# Patient Record
Sex: Female | Born: 1970 | Race: White | Hispanic: No | Marital: Married | Smoking: Never smoker
Health system: Southern US, Community
[De-identification: ages and names within clinical notes are randomized; demographics above are authoritative.]

## PROBLEM LIST (undated history)

## (undated) DIAGNOSIS — IMO0002 Reserved for concepts with insufficient information to code with codable children: Secondary | ICD-10-CM

## (undated) DIAGNOSIS — E039 Hypothyroidism, unspecified: Secondary | ICD-10-CM

## (undated) DIAGNOSIS — N920 Excessive and frequent menstruation with regular cycle: Secondary | ICD-10-CM

## (undated) DIAGNOSIS — I10 Essential (primary) hypertension: Secondary | ICD-10-CM

## (undated) DIAGNOSIS — C50911 Malignant neoplasm of unspecified site of right female breast: Secondary | ICD-10-CM

## (undated) DIAGNOSIS — IMO0001 Reserved for inherently not codable concepts without codable children: Secondary | ICD-10-CM

## (undated) DIAGNOSIS — C50919 Malignant neoplasm of unspecified site of unspecified female breast: Secondary | ICD-10-CM

## (undated) DIAGNOSIS — R112 Nausea with vomiting, unspecified: Secondary | ICD-10-CM

## (undated) DIAGNOSIS — Z803 Family history of malignant neoplasm of breast: Secondary | ICD-10-CM

## (undated) DIAGNOSIS — E282 Polycystic ovarian syndrome: Secondary | ICD-10-CM

## (undated) DIAGNOSIS — Z9889 Other specified postprocedural states: Secondary | ICD-10-CM

## (undated) HISTORY — DX: Reserved for inherently not codable concepts without codable children: IMO0001

## (undated) HISTORY — DX: Malignant neoplasm of unspecified site of unspecified female breast: C50.919

## (undated) HISTORY — DX: Reserved for concepts with insufficient information to code with codable children: IMO0002

## (undated) HISTORY — PX: TONSILLECTOMY: SUR1361

## (undated) HISTORY — DX: Family history of malignant neoplasm of breast: Z80.3

## (undated) NOTE — *Deleted (*Deleted)
Patient Care Team: Merri Brunette, MD as PCP - General (Family Medicine) Serena Croissant, MD as Consulting Physician (Hematology and Oncology) Lurline Hare, MD as Consulting Physician (Radiation Oncology) Emelia Loron, MD as Consulting Physician (General Surgery) Salomon Fick, NP as Nurse Practitioner (Hematology and Oncology)  DIAGNOSIS: No diagnosis found.  SUMMARY OF ONCOLOGIC HISTORY: Oncology History  Breast cancer of upper-outer quadrant of right female breast (HCC)  03/16/2015 Mammogram   Right breast: mass   03/16/2015 Breast US   Right breast: hypoechoic irregular shadowing mass at 12 o'clock, 4 cm from the nipple measuring 1.4 x 1.3 x 1.2 cm. Right axillary lymph nodes with smooth cortical thickness   03/22/2015 Initial Biopsy   Right breast biopsy: Invasive ductal carcinoma with calcifications; ER+ (98%), PR+ (98%), Ki-67 6%, HER-2 negative (ratio 1.35)   04/04/2015 Breast MRI   Right breast: 2.0 cm hematoma/ seroma within the upper central portion of the breast following recent stereotactic guided core biopsy   04/05/2015 Clinical Stage   Stage IA: T1c N0   04/06/2015 Procedure   Genetics: OvaNext panel revealed  VUTS at MSH2 (c.2164G>A) otherwise negative at ATM, BARD1, BRCA1, BRCA2, BRIP1, CDH1, CHEK2, EPCAM, MLH1, MRE11A, MSH6, MUTYH, NBN, NF1, PALB2, PMS2, PTEN, RAD50, RAD51C, RAD51D, SMARCA4, STK11, and TP53.   UPDATE:  MSH2 c.2164G>A VUS was amended to Variant, Likely Benign on 04/28/2019 due to internal data.   04/28/2015 Definitive Surgery   Right lumpectomy/SLNB Dwain Sarna): Invasive ductal carcinoma grade 1, 0.9 cm, low-grade DCIS, margins negative, 0/6 sentinel nodes negative, ER 98%, PR 98%, HER-2 negative (ratio 1.29), Ki-67 6%,  Oncotype DX score 10 (7% ROR)   04/28/2015 Pathologic Stage   Stage IA (T1b N0)    04/28/2015 Oncotype testing   Score 10 (7% ROR)   06/15/2015 - 07/14/2015 Radiation Therapy   Adjuvant RT Michell Heinrich): Right breast  42.72 Gy over 21 fractions; right breast boost 10 G over 5 fractions   07/14/2015 -  Anti-estrogen oral therapy   Tamoxifen 20 mg daily. Planned duration of therapy 5-10 years.   09/01/2015 Survivorship   Survivorship care plan completed and given to patient.     CHIEF COMPLIANT:  Follow-upof right breast cancer  INTERVAL HISTORY: [name] is a 56 y.o. with above-mentioned history of right breast cancer treated with lumpectomy,radiation, and who was on tamoxifen but discontinued it due to intermittent fevers and easy bruising. Labs showed she was positive for type 2 von Willebrand factor and factor VIII deficiency. She experienced left breast swelling and tenderness and breast MRI on 04/19/20 showed no evidence of malignancy bilaterally. She presents to the clinic todayfor follow-up.  ALLERGIES:  is allergic to ciprofloxacin and sulfa antibiotics.  MEDICATIONS:  Current Outpatient Medications  Medication Sig Dispense Refill  . ALPRAZolam (XANAX) 0.5 MG tablet TK 1 T PO Q 6 H PRA  1  . diazepam (VALIUM) 5 MG tablet Take 1 tablet (5 mg total) by mouth as directed. Take one tablet one hour prior to MRI, then one tablet upon arrival if needed; must have driver 2 tablet 0  . empagliflozin (JARDIANCE) 10 MG TABS tablet Take 10 mg by mouth daily.    Marland Kitchen levothyroxine (SYNTHROID, LEVOTHROID) 50 MCG tablet Take 50 mcg by mouth daily before breakfast.    . lisinopril (ZESTRIL) 10 MG tablet Take 10 mg by mouth daily.    . pantoprazole (PROTONIX) 40 MG tablet Take 40 mg by mouth daily.    Marland Kitchen venlafaxine XR (EFFEXOR-XR) 75 MG 24  hr capsule TAKE 1 CAPSULE(75 MG) BY MOUTH DAILY WITH BREAKFAST 90 capsule 3   No current facility-administered medications for this visit.    PHYSICAL EXAMINATION: ECOG PERFORMANCE STATUS: {CHL ONC ECOG PS:[phone]}  There were no vitals filed for this visit. There were no vitals filed for this visit.  BREAST:*** No palpable masses or nodules in either right or  left breasts. No palpable axillary supraclavicular or infraclavicular adenopathy no breast tenderness or nipple discharge. (exam performed in the presence of a chaperone)  LABORATORY DATA:  I have reviewed the data as listed CMP Latest Ref Rng & Units 12/30/2019 06/19/2016 07/14/2015  Glucose 70 - 99 mg/dL 161(W) 960(A) 540  BUN 6 - 20 mg/dL 12 11 98.1  Creatinine 0.44 - 1.00 mg/dL 1.91 4.78 0.8  Sodium 295 - 145 mmol/L 140 135 140  Potassium 3.5 - 5.1 mmol/L 4.5 3.3(L) 3.1(L)  Chloride 98 - 111 mmol/L 104 103 -  CO2 22 - 32 mmol/L 26 22 24   Calcium 8.9 - 10.3 mg/dL 9.4 62.1 9.8  Total Protein 6.5 - 8.1 g/dL 7.8 - 7.6  Total Bilirubin 0.3 - 1.2 mg/dL 0.4 - 3.08  Alkaline Phos 38 - 126 U/L 76 - 60  AST 15 - 41 U/L 19 - 14  ALT 0 - 44 U/L 31 - 20    Lab Results  Component Value Date   WBC 7.1 12/30/2019   HGB 15.1 (H) 12/30/2019   HCT 44.4 12/30/2019   MCV 93.1 12/30/2019   PLT 211 12/30/2019   NEUTROABS 4.7 12/30/2019    ASSESSMENT & PLAN:  No problem-specific Assessment & Plan notes found for this encounter.    No orders of the defined types were placed in this encounter.  The patient has a good understanding of the overall plan. she agrees with it. she will call with any problems that may develop before the next visit here.  Total time spent: *** mins including face to face time and time spent for planning, charting and coordination of care  Serena Croissant, MD 09/05/2020  I, Kirt Boys Dorshimer, am acting as scribe for Dr. Serena Croissant.  {insert scribe attestation}

---

## 1998-02-01 ENCOUNTER — Inpatient Hospital Stay (HOSPITAL_COMMUNITY): Admission: AD | Admit: 1998-02-01 | Discharge: 1998-02-04 | Payer: Self-pay | Admitting: Obstetrics and Gynecology

## 1998-03-14 ENCOUNTER — Other Ambulatory Visit: Admission: RE | Admit: 1998-03-14 | Discharge: 1998-03-14 | Payer: Self-pay | Admitting: *Deleted

## 1999-04-05 ENCOUNTER — Other Ambulatory Visit: Admission: RE | Admit: 1999-04-05 | Discharge: 1999-04-05 | Payer: Self-pay | Admitting: Obstetrics and Gynecology

## 1999-08-25 ENCOUNTER — Encounter: Payer: Self-pay | Admitting: Family Medicine

## 1999-08-25 ENCOUNTER — Encounter: Admission: RE | Admit: 1999-08-25 | Discharge: 1999-08-25 | Payer: Self-pay | Admitting: Family Medicine

## 2000-03-25 ENCOUNTER — Inpatient Hospital Stay (HOSPITAL_COMMUNITY): Admission: AD | Admit: 2000-03-25 | Discharge: 2000-03-25 | Payer: Self-pay | Admitting: Obstetrics and Gynecology

## 2000-03-25 ENCOUNTER — Encounter: Payer: Self-pay | Admitting: Obstetrics and Gynecology

## 2000-03-28 ENCOUNTER — Other Ambulatory Visit: Admission: RE | Admit: 2000-03-28 | Discharge: 2000-03-28 | Payer: Self-pay | Admitting: Obstetrics and Gynecology

## 2000-07-02 ENCOUNTER — Inpatient Hospital Stay (HOSPITAL_COMMUNITY): Admission: AD | Admit: 2000-07-02 | Discharge: 2000-07-02 | Payer: Self-pay | Admitting: Obstetrics and Gynecology

## 2000-07-05 ENCOUNTER — Encounter: Payer: Self-pay | Admitting: Obstetrics & Gynecology

## 2000-07-05 ENCOUNTER — Inpatient Hospital Stay (HOSPITAL_COMMUNITY): Admission: AD | Admit: 2000-07-05 | Discharge: 2000-07-05 | Payer: Self-pay | Admitting: Obstetrics & Gynecology

## 2000-10-09 ENCOUNTER — Inpatient Hospital Stay (HOSPITAL_COMMUNITY): Admission: AD | Admit: 2000-10-09 | Discharge: 2000-10-12 | Payer: Self-pay | Admitting: Obstetrics and Gynecology

## 2000-10-15 ENCOUNTER — Inpatient Hospital Stay (HOSPITAL_COMMUNITY): Admission: AD | Admit: 2000-10-15 | Discharge: 2000-10-15 | Payer: Self-pay | Admitting: Obstetrics and Gynecology

## 2000-10-17 ENCOUNTER — Encounter: Admission: RE | Admit: 2000-10-17 | Discharge: 2001-01-15 | Payer: Self-pay | Admitting: Obstetrics and Gynecology

## 2000-10-29 HISTORY — PX: ACHILLES TENDON REPAIR: SUR1153

## 2000-11-21 ENCOUNTER — Other Ambulatory Visit: Admission: RE | Admit: 2000-11-21 | Discharge: 2000-11-21 | Payer: Self-pay | Admitting: Obstetrics and Gynecology

## 2001-12-09 ENCOUNTER — Other Ambulatory Visit: Admission: RE | Admit: 2001-12-09 | Discharge: 2001-12-09 | Payer: Self-pay | Admitting: Obstetrics and Gynecology

## 2002-12-01 ENCOUNTER — Other Ambulatory Visit: Admission: RE | Admit: 2002-12-01 | Discharge: 2002-12-01 | Payer: Self-pay | Admitting: Obstetrics and Gynecology

## 2004-03-07 ENCOUNTER — Other Ambulatory Visit: Admission: RE | Admit: 2004-03-07 | Discharge: 2004-03-07 | Payer: Self-pay | Admitting: Obstetrics and Gynecology

## 2005-12-13 ENCOUNTER — Other Ambulatory Visit: Admission: RE | Admit: 2005-12-13 | Discharge: 2005-12-13 | Payer: Self-pay | Admitting: Obstetrics and Gynecology

## 2006-11-11 ENCOUNTER — Emergency Department (HOSPITAL_COMMUNITY): Admission: EM | Admit: 2006-11-11 | Discharge: 2006-11-11 | Payer: Self-pay | Admitting: Family Medicine

## 2007-08-08 ENCOUNTER — Encounter: Admission: RE | Admit: 2007-08-08 | Discharge: 2007-08-08 | Payer: Self-pay | Admitting: Obstetrics and Gynecology

## 2009-02-21 ENCOUNTER — Ambulatory Visit (HOSPITAL_COMMUNITY): Admission: EM | Admit: 2009-02-21 | Discharge: 2009-02-22 | Payer: Self-pay | Admitting: Emergency Medicine

## 2009-02-21 ENCOUNTER — Encounter (INDEPENDENT_AMBULATORY_CARE_PROVIDER_SITE_OTHER): Payer: Self-pay | Admitting: General Surgery

## 2009-02-21 HISTORY — PX: APPENDECTOMY: SHX54

## 2011-02-07 LAB — URINALYSIS, ROUTINE W REFLEX MICROSCOPIC
Glucose, UA: NEGATIVE mg/dL
Leukocytes, UA: NEGATIVE
Protein, ur: NEGATIVE mg/dL
pH: 7 (ref 5.0–8.0)

## 2011-02-07 LAB — DIFFERENTIAL
Basophils Relative: 0 % (ref 0–1)
Monocytes Relative: 6 % (ref 3–12)
Neutro Abs: 5.1 10*3/uL (ref 1.7–7.7)
Neutrophils Relative %: 67 % (ref 43–77)

## 2011-02-07 LAB — POCT I-STAT, CHEM 8
Chloride: 106 mEq/L (ref 96–112)
Glucose, Bld: 106 mg/dL — ABNORMAL HIGH (ref 70–99)
HCT: 41 % (ref 36.0–46.0)
Potassium: 3.8 mEq/L (ref 3.5–5.1)
Sodium: 138 mEq/L (ref 135–145)

## 2011-02-07 LAB — CBC
MCHC: 34.8 g/dL (ref 30.0–36.0)
RBC: 4.52 MIL/uL (ref 3.87–5.11)
WBC: 7.6 10*3/uL (ref 4.0–10.5)

## 2011-02-07 LAB — URINE MICROSCOPIC-ADD ON

## 2011-02-07 LAB — PREGNANCY, URINE: Preg Test, Ur: NEGATIVE

## 2011-03-13 NOTE — H&P (Signed)
NAME:  April, [name] NO.:  [id number]   MEDICAL RECORD NO.:  [id number]          PATIENT TYPE:  INP   LOCATION:  0098                         FACILITY:  [facility]   PHYSICIAN:  Lorne Skeens. Hoxworth, M.D.DATE OF BIRTH:  February 02, 1971   DATE OF ADMISSION:  02/21/2009  DATE OF DISCHARGE:                              HISTORY & PHYSICAL   CHIEF COMPLAINT:  Abdominal pain   HISTORY OF PRESENT ILLNESS:  [name] is a generally healthy 40-year-  old female who about 24 hours ago noted the gradual onset of  midabdominal periumbilical pain.  She thought this felt like gas or  pressure.  This persisted and when she woke today, the pain was  localized in the right lower quadrant, constant and somewhat more  severe, appears worse with any motion.  She has had nausea but no  vomiting.  She has a history of some ovarian cysts that she presented to  her GYN physician and pelvic exam was unremarkable.  She was sent to the  emergency room for further evaluation including CT scan.  She has not  had any fever or chills.  She has no chronic GI symptoms.  No urinary  symptoms or any history of previous similar complaints.   PAST MEDICAL HISTORY:  Previous surgery includes C-section x2 and  Achilles tendon repair.  No medical illnesses.   MEDICATIONS:  Birth control pills and p.r.n. Motrin.   ALLERGIES:  None.   SOCIAL HISTORY:  She is married.  Does not smoke cigarettes.  Drinks  occasional alcohol.   FAMILY HISTORY:  Mother with history of diabetes and hypertension.  She  had an aunt with breast cancer.   REVIEW OF SYSTEMS:  GENERAL:  Positive for malaise.  No fever or chills.  RESPIRATORY:  No shortness of breath, cough, or wheezing.  CARDIAC:  No chest pain, palpitations, or significant heart disease.  ABDOMEN:  GI as above.  GU:  As above.   PHYSICAL EXAMINATION:  VITAL SIGNS:  Temperature is 98.6, pulse 83,  respirations 18, and blood pressure 142/98.  GENERAL:  A  healthy-appearing Caucasian female in no acute distress.  SKIN:  Warm and dry.  No rash or infection.  HEENT:  No palpable masses or thyromegaly.  Oropharynx is clear.  Sclerae nonicteric.  LYMPH NODES:  No cervical, supraclavicular, or inguinal nodes palpable.  CARDIAC:  Regular rate and rhythm.  No murmurs.  No edema.  LUNGS:  Clear without wheezing or increased work of breathing.  ABDOMEN:  Positive bowel sounds.  Nondistended.  There is localized  right lower quadrant tenderness with some guarding.  No discernible  masses or organomegaly.  No peritoneal signs.  EXTREMITIES:  No joint swelling or deformity.  NEUROLOGIC:  Alert and oriented.  Motor and sensory exams grossly  normal.   LABORATORY AND X-RAY:  White count normal at 7.6 and hemoglobin 13.9.  Urinalysis negative.  Electrolytes normal.  Pregnancy test negative.   CT scan of the abdomen and pelvis is reviewed.  This shows thickening of  the appendix up to 1 centimeter in diameter  and nonfilling with  contrast.  No significant periappendiceal inflammation.  This is felt to  be consistent with early appendicitis.   ASSESSMENT/PLAN:  Clinical course, physical exam, and CT scan all  suggesting acute appendicitis.  I have recommended proceeding with  laparoscopic appendectomy and the patient admitted for this procedure.      Lorne Skeens. Hoxworth, M.D.  Electronically Signed     BTH/MEDQ  D:  02/21/2009  T:  02/22/2009  Job:  [id number]

## 2011-03-13 NOTE — Op Note (Signed)
NAME:  YI, April NO.:  [id number]   MEDICAL RECORD NO.:  [id number]          PATIENT TYPE:  EMS   LOCATION:  ED                           FACILITY:  [facility]   PHYSICIAN:  Lorne Skeens. Hoxworth, M.D.DATE OF BIRTH:  Mar 24, 1971   DATE OF PROCEDURE:  02/21/2009  DATE OF DISCHARGE:                               OPERATIVE REPORT   PREOPERATIVE DIAGNOSIS:  Acute appendicitis.   POSTOPERATIVE DIAGNOSIS:  Acute appendicitis.   SURGICAL PROCEDURES:  Laparoscopic appendectomy.   SURGEON:  Lorne Skeens. Hoxworth, M.D.   ANESTHESIA:  General.   BRIEF HISTORY:  Myron Lona is a 40 year old female who presents with a  24-hour history of abdominal pain classic for appendicitis and she has  localized right lower quadrant tenderness with guarding.  CT scan has  shown evidence of early uncomplicated acute appendicitis.  I have  recommended proceeding with laparoscopic appendectomy.  The nature of  the procedure, its indications and risks of anesthetic complications,  bleeding, infection were discussed and understood.  She is now brought  to the operating room for this procedure.   DESCRIPTION OF OPERATION:  The patient was brought to operating room,  placed in supine position on the operating table and general  endotracheal anesthesia was induced.  PAS were placed.  She received  preoperative IV antibiotics.  Foley catheter was placed.  The abdomen  was widely sterilely prepped and draped.  Local anesthesia was used to  infiltrate the trocar sites.  Through a 1 cm umbilical incision, an open  Hasson technique was used and the Hasson trocar placed through mattress  suture of 0 Vicryl and pneumoperitoneum established.  Under direct  vision, a 5 mm trocar was placed in the right upper quadrant and a 12 mm  trocar in the left lower quadrant.  The appendix was exposed lying  anteriorly in the right lower quadrant and appeared to have early but  definite acute inflammation with  thickening and injection and some  induration.  No evidence of perforation abscess or other complication.  The cecum and base of the appendix appeared normal.  Adnexa appeared  normal.  The appendix was elevated and the mesoappendix was sequentially  divided with harmonic scalpel completely freeing the appendix down the  tip of the cecum.  The appendix was then divided across the tip of the  cecum with a single firing of the Endo GIA 30 mm blue load stapler.  The  staple line was carefully visualized and was intact and without  bleeding.  Complete hemostasis was assured.  Appendix was placed in  EndoCatch bag and removed through the umbilica incision.  All CO2 was  evacuated.  Trocars were removed and the mattress suture secured to the  umbilicus.  Skin incisions were closed with subcuticular 4-0 Monocryl  and Dermabond.  Sponge, needle and instrument counts were correct.  The  patient was taken to recovery room in good condition.     Lorne Skeens. Hoxworth, M.D.  Electronically Signed    BTH/MEDQ  D:  02/21/2009  T:  02/22/2009  Job:  [id number]

## 2011-03-16 NOTE — Op Note (Signed)
[facility] [place]  Patient:    [name], [name]                        MRN: [id number] Adm. Date:  [id number] Attending:  Rhina Brackett                           Operative Report  PREOPERATIVE DIAGNOSES:       1. Term intrauterine pregnancy.                               2. Previous cesarean section.                               3. Declines vaginal birth after cesarean.                               4. 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                               5. Moderate preoperative drop in platelet count                                  near term.  POSTOPERATIVE DIAGNOSES:      1. Term intrauterine pregnancy.                               2. Previous cesarean section.                               3. Declines vaginal birth after cesarean.                               4. 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                               5. Moderate preoperative drop in platelet count                                  near term.  OPERATION:                    Repeat low transverse cesarean section.  SURGEON:                      Duke Salvia. Marcelle Overlie, M.D.  ANESTHESIA:  Spinal.  COMPLICATIONS:                None.  DRAINS:                       Foley catheter.  ESTIMATED BLOOD LOSS:         1000 cc.  PROCEDURE AND FINDINGS:       Patient entered the operating room and after an adequate level of spinal anesthetic was obtained, with the patient in leftward tilt position, the abdomen was prepped and draped in the usual manner of sterile abdominal procedures. A transverse incision was made excising the old scar. This was done after Foley catheter was positioned. Fascia incised transversely, rectus muscle was divided in the midline. Peritoneum entered superiorly without incident and extended in  a vertical manner. The vesicouterine surface was then incised and the bladder bluntly and sharply dissected off of the lower uterine segment and bladder blade was positioned. A transverse incision was made to the lower segment, extended with blunt dissection. Lower segment was thin. Clear fluid was noted. The patient delivered of a 9-pound 14-ounce female, Apgars 9 and 9, loose nuchal cord x 1. The infant was suctioned, cord clamped, and passed to pediatric team for further care. Placenta delivered manually intact. Uterus exteriorized. Cavity wiped clean with laparotomy pack. Closure obtained first layer of 0 chromic in a locked fashion followed by an imbricating layer of 0 chromic. This area was hemostatic except for the left angle which required several figure-of-eight sutures of 0 chromic for complete hemostasis. Tubes and ovaries were inspected and noted to be normal. The bladder flap area was intact and hemostatic. Urine remained clear throughout. The uterus was then returned its intra-abdominal position. Prior to closure, sponge, needle and instrument counts were reported as correct x 2. The rectus muscles were approximated with 2-0 Dexon interrupted sutures. Fascia  was closed from laterally to midline in a on either side with a 0 Dexon running suture. Subcutaneous fat was hemostatic. Clips and Steri-Strips were used on the skin. She tolerated this well and went to recovery room in good condition. DD:  10/09/00 TD:  10/09/00 Job: [id number]

## 2011-03-16 NOTE — Consult Note (Signed)
[facility]  Patient:    [name], [name]                        MRN: [id number] Adm. Date:  [id number] Attending:  Minette Headland                          Consultation Report  HISTORY OF PRESENT ILLNESS:   The patient is a 40 year old gravida 2, para 1, who had an episode of mucus-like bloody discharge vaginal discharge on the morning of her visit in the emergency room.  There was a vague history of upper abdominal cramping, and for that reason she was advised to come to the emergency room for an evaluation.  PHYSICAL EXAMINATION:  PELVIC:                       Cervix was found to be firm, 3.0 cm long, and closed. There were no uterine contractions detected on the monitor.  The fetal heart tracing was normal.  She did have a history of bleeding in the first trimester, with a subchorionic hemorrhage.  A repeat ultrasound was obtained, with findings consistent with a normal 24-week pregnancy, and no obvious evidence of source of bleeding, and 3.6 cm cervical length.  DISPOSITION:                  Based on these findings, it was felt that the patient was in satisfactory condition.  She was sent home, to return for recurrence of symptoms, and was given warnings about preterm labor and no symptoms.  She is to follow up this coming week in the office. DD:  07/06/00 TD:  07/06/00 Job: [id number]

## 2011-03-16 NOTE — Discharge Summary (Signed)
[facility]  Patient:    [name], [name]                        MRN: [id number] Adm. Date:  [id number] Disc. Date: [id number] Attending:  Madelyn Flavors Dictator:   Danie Chandler, R.N.                           Discharge Summary  ADMITTING DIAGNOSES:          1. Term intrauterine pregnancy.                               2. Previous cesarean section.                               3. Declines vaginal birth after cesarean.                               4. 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                               5. Moderate preoperative drop in platelet count                                  near term.  DISCHARGE DIAGNOSES:          1. Term intrauterine pregnancy.                               2. Previous cesarean section.                               3. Declines vaginal birth after cesarean.                               4. 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                               5. Moderate preoperative drop in platelet count                                  near term.  PROCEDURE:                    On October 09, 2000 repeat low transverse cesarean section.  REASON FOR ADMISSION:         Please see dictated H&P.  HOSPITAL COURSE:  The patient was taken to the operating room and underwent the above named procedure without complication.  This was productive of a viable female infant with Apgars of 9 at one minute and 9 at five minutes.  Postoperatively the patient did well.  On postoperative day #1 the patient had good control of pain.  Her hemoglobin on this day was 10.2, hematocrit 27.7, and white blood cell count 10.4.  Platelets were 108,000. The patient was stable on this day.  Her deep tendon reflexes were 1+ with no clonus.  She denied any PIH signs or symptoms.  She had a  repeat CBC and PIH laboratories ordered for the following morning.  On postoperative day #2 the patient was tolerating a regular diet and ambulating well without difficulty. Her vital signs were stable.  Her hemoglobin was 8.8, platelets 113,000, PIH laboratories were within normal limits.  On postoperative day #3 the patient had a good return of bowel function.  Her platelets on this day were 133,000, hemoglobin 9.1.  She was discharged home this day.  CONDITION ON DISCHARGE:       Stable.  DIET:                         Regular as tolerated.  ACTIVITY:                     No heavy lifting, driving, vaginal entry.  FOLLOW-UP:                    She is to follow up in the office in one to two weeks for incision check.  She is to call for temperature greater than 100 degrees, persistent nausea or vomiting, heavy vaginal bleeding, and/or redness or drainage from the incision site as well as any PIH signs or symptoms.  DISCHARGE MEDICATIONS:        1. Prenatal vitamin one p.o. q.d.                               2. Percocet 5 mg one to two p.o. q.3-4h. p.r.n.                                  pain. DD:  11/04/00 TD:  11/04/00 Job: [id number]

## 2011-03-16 NOTE — H&P (Signed)
[facility]  Patient:    [name], [name]                          MRN: [id number] Adm. Date:  10/09/00 Attending:  Duke Salvia. Marcelle Overlie, M.D.                         History and Physical  CHIEF COMPLAINT:              Term pregnancy, previous LTCS, declines VBAC, for a repeat cesarean section.  HISTORY OF PRESENT ILLNESS:   This 40 year old G 2, P 1, with EDP of October 21, 2000.  She had delivered previously in 1999, for LTCS for severe pre-eclampsia with HELLP syndrome, which completely resolved postpartum.  Her initial blood pressure was 116/80.  She has had an uneventful pregnancy with a one-hour GTT of 98, blood type O-positive, rubella titer positive.  We had scheduled a cesarean section on October 14, 2000, but she was seen in the office on October 07, 2000, with a reactive NST, and negative proteinuria. Blood pressure of 130/76.  Laboratory studies were drawn that showed a moderate drop in her platelet count from initial level of 207,000 to 115,000. She is currently [redacted] weeks gestation, and will present today for a repeat cesarean section.  This procedure, including the risks, relative to bleeding, infection, transfusion, and adjacent organ injury have been reviewed with her, which she understands and accepts.  ALLERGIES:                    None.  PAST SURGICAL HISTORY:        Cesarean section in 1999.  REVIEW OF SYSTEMS:            Otherwise unremarkable except for a history of PIH, and HELLP syndrome.  PHYSICAL EXAMINATION:  VITAL SIGNS:                  Temperature 98.2 degrees, blood pressure 130/76.  HEENT:                        Unremarkable.  NECK:                         Supple without masses.  LUNGS:                        Clear.  CARDIOVASCULAR:               A regular rate and rhythm without murmurs or gallops.  BREASTS:                      Not examined.  ABDOMEN:                      Term fundal height.  Fetal heart  rate 140.  PELVIC:                       The cervix was closed.  EXTREMITIES:                  Reveal 1+ edema.  Reflexes 1-2+.  No clonus.  IMPRESSION:                   1. A 38-week intrauterine pregnancy.  2. Previous history of a prior low transverse                                  cesarean section for pregnancy-induced                                  hypertension and hemolysis, elevated liver                                  enzymes, and low platelet count syndrome.                               3. Moderate drop in her platelet count.  PLAN:                         A repeat cesarean section.  Again, the risks relative to bleeding, infection, adjacent organ injury, and transfusion risk were discussed with her.  She has declined VBAC, and presents now for a repeat cesarean section. DD:  10/09/00 TD:  10/09/00 Job: [id number]

## 2012-09-05 ENCOUNTER — Ambulatory Visit
Admission: RE | Admit: 2012-09-05 | Discharge: 2012-09-05 | Disposition: A | Discharge status: Home / Self Care | Payer: 59 | Source: Ambulatory Visit | Attending: Family Medicine | Admitting: Family Medicine

## 2012-09-05 ENCOUNTER — Other Ambulatory Visit: Payer: Self-pay | Admitting: Family Medicine

## 2012-09-05 DIAGNOSIS — R079 Chest pain, unspecified: Secondary | ICD-10-CM

## 2012-09-05 MED ORDER — IOHEXOL 350 MG/ML SOLN
125.0000 mL | Freq: Once | INTRAVENOUS | Status: AC | PRN
  Administered 2012-09-05: 125 mL via INTRAVENOUS

## 2012-12-22 ENCOUNTER — Other Ambulatory Visit: Payer: Self-pay | Admitting: Obstetrics and Gynecology

## 2013-08-29 HISTORY — PX: HYSTEROSCOPY WITH NOVASURE: SHX5574

## 2013-09-02 ENCOUNTER — Other Ambulatory Visit: Payer: Self-pay | Admitting: Obstetrics and Gynecology

## 2014-02-11 ENCOUNTER — Other Ambulatory Visit: Payer: Self-pay | Admitting: Obstetrics and Gynecology

## 2014-02-17 ENCOUNTER — Other Ambulatory Visit: Payer: Self-pay | Admitting: Obstetrics and Gynecology

## 2014-02-17 DIAGNOSIS — N63 Unspecified lump in unspecified breast: Secondary | ICD-10-CM

## 2014-02-24 ENCOUNTER — Ambulatory Visit
Admission: RE | Admit: 2014-02-24 | Discharge: 2014-02-24 | Disposition: A | Discharge status: Home / Self Care | Payer: Commercial Managed Care - PPO | Source: Ambulatory Visit | Attending: Obstetrics and Gynecology | Admitting: Obstetrics and Gynecology

## 2014-02-24 DIAGNOSIS — N63 Unspecified lump in unspecified breast: Secondary | ICD-10-CM

## 2014-07-20 ENCOUNTER — Encounter (HOSPITAL_BASED_OUTPATIENT_CLINIC_OR_DEPARTMENT_OTHER): Payer: Self-pay | Admitting: *Deleted

## 2014-07-23 ENCOUNTER — Encounter (HOSPITAL_BASED_OUTPATIENT_CLINIC_OR_DEPARTMENT_OTHER): Payer: Self-pay | Admitting: *Deleted

## 2014-07-23 NOTE — Progress Notes (Addendum)
NPO AFTER MN. ARRIVE AT 0600. NEEDS ISTAT, EKG AND URINE PREG. WILL TAKE SYNTHROID AM DOS W/ SIPS OF WATER. PT AWARE OWER AT MAIN.

## 2014-07-26 NOTE — H&P (Signed)
[name] is an 43 y.o. G2 P 2 with history of endometrial ablation presents with menorrhagia. She has heavy periods every 3 weeks. She saw her endocrinologist who said her thyroid levels were normal but ferritin was low. She is having fatigue and hair loss.  Pertinent Gynecological History: Menses: flow is excessive with use of multiple pads or tampons on heaviest days Bleeding: dysfunctional uterine bleeding Contraception: vasectomy DES exposure: denies Blood transfusions: none Sexually transmitted diseases: no past history Previous GYN Procedures: endometrial ablation  Last mammogram: normal Date: 2015 Last pap: normal Date: 2015 OB History: G2, P2   Menstrual History: Menarche age: unknown  Patient's last menstrual period was 07/14/2014.    Past Medical History  Diagnosis Date  . Menorrhagia   . PCOS (polycystic ovarian syndrome)   . Hypertension   . Hypothyroidism     Past Surgical History  Procedure Laterality Date  . Cesarean section  10-09-2000  &  1999  . Appendectomy  02-21-2009  . Tonsillectomy  age 3  . Achilles tendon repair Right 2002  . Hysteroscopy with novasure  11/ 2014    History reviewed. No pertinent family history.  Social History:  reports that she has never smoked. She has never used smokeless tobacco. She reports that she drinks about 1.8 ounces of alcohol per week. She reports that she does not use illicit drugs.  Allergies: No Known Allergies  No prescriptions prior to admission    ROS  Height 5\' 6"  (1.676 m), weight 71.668 kg (158 lb), last menstrual period 07/14/2014. Physical Exam Afebrile VSS General alert and oriented Lung CTAB Car RRR Abdomen is soft and non tender Pelvic uterus has good descensus and non tender  Assessment/Plan: Menorrhagia LAVH, Bilateral salpingectomy Risks reviewed consent signed  [name] L 07/26/2014, 1:55 PM

## 2014-07-26 NOTE — Anesthesia Preprocedure Evaluation (Addendum)
Anesthesia Evaluation  Patient identified by MRN, date of birth, ID band Patient awake    Reviewed: Allergy & Precautions, H&P , NPO status , Patient's Chart, lab work & pertinent test results  History of Anesthesia Complications Negative for: history of anesthetic complications  Airway Mallampati: II TM Distance: >3 FB Neck ROM: Full    Dental no notable dental hx. (+) Caps,    Pulmonary neg pulmonary ROS,  breath sounds clear to auscultation  Pulmonary exam normal       Cardiovascular Exercise Tolerance: Good hypertension, Pt. on medications Rhythm:Regular Rate:Normal     Neuro/Psych negative neurological ROS  negative psych ROS   GI/Hepatic negative GI ROS, Neg liver ROS,   Endo/Other  neg diabetesHypothyroidism Takes metformin for PCOS  Renal/GU negative Renal ROS  negative genitourinary   Musculoskeletal negative musculoskeletal ROS (+)   Abdominal   Peds negative pediatric ROS (+)  Hematology negative hematology ROS (+)   Anesthesia Other Findings   Reproductive/Obstetrics negative OB ROS                          Anesthesia Physical Anesthesia Plan  ASA: II  Anesthesia Plan: General   Post-op Pain Management:    Induction: Intravenous  Airway Management Planned: Oral ETT  Additional Equipment:   Intra-op Plan:   Post-operative Plan: Extubation in OR  Informed Consent: I have reviewed the patients History and Physical, chart, labs and discussed the procedure including the risks, benefits and alternatives for the proposed anesthesia with the patient or authorized representative who has indicated his/her understanding and acceptance.   Dental advisory given  Plan Discussed with: CRNA  Anesthesia Plan Comments:         Anesthesia Quick Evaluation

## 2014-07-27 ENCOUNTER — Observation Stay (HOSPITAL_BASED_OUTPATIENT_CLINIC_OR_DEPARTMENT_OTHER)
Admission: RE | Admit: 2014-07-27 | Discharge: 2014-07-28 | Disposition: A | Discharge status: Home / Self Care | Payer: Commercial Managed Care - PPO | Source: Ambulatory Visit | Attending: Obstetrics and Gynecology | Admitting: Obstetrics and Gynecology

## 2014-07-27 ENCOUNTER — Encounter (HOSPITAL_COMMUNITY)
Admission: RE | Disposition: A | Discharge status: Home / Self Care | Payer: Self-pay | Source: Ambulatory Visit | Attending: Obstetrics and Gynecology

## 2014-07-27 ENCOUNTER — Ambulatory Visit (HOSPITAL_BASED_OUTPATIENT_CLINIC_OR_DEPARTMENT_OTHER): Payer: Commercial Managed Care - PPO | Admitting: Anesthesiology

## 2014-07-27 ENCOUNTER — Encounter (HOSPITAL_BASED_OUTPATIENT_CLINIC_OR_DEPARTMENT_OTHER): Payer: Commercial Managed Care - PPO | Admitting: Anesthesiology

## 2014-07-27 ENCOUNTER — Encounter (HOSPITAL_BASED_OUTPATIENT_CLINIC_OR_DEPARTMENT_OTHER): Payer: Self-pay | Admitting: *Deleted

## 2014-07-27 DIAGNOSIS — N803 Endometriosis of pelvic peritoneum, unspecified: Secondary | ICD-10-CM | POA: Diagnosis not present

## 2014-07-27 DIAGNOSIS — Z90710 Acquired absence of both cervix and uterus: Secondary | ICD-10-CM | POA: Diagnosis present

## 2014-07-27 DIAGNOSIS — N736 Female pelvic peritoneal adhesions (postinfective): Secondary | ICD-10-CM | POA: Insufficient documentation

## 2014-07-27 DIAGNOSIS — N838 Other noninflammatory disorders of ovary, fallopian tube and broad ligament: Secondary | ICD-10-CM | POA: Diagnosis not present

## 2014-07-27 DIAGNOSIS — N920 Excessive and frequent menstruation with regular cycle: Principal | ICD-10-CM | POA: Insufficient documentation

## 2014-07-27 DIAGNOSIS — D259 Leiomyoma of uterus, unspecified: Secondary | ICD-10-CM | POA: Diagnosis not present

## 2014-07-27 DIAGNOSIS — E282 Polycystic ovarian syndrome: Secondary | ICD-10-CM | POA: Insufficient documentation

## 2014-07-27 DIAGNOSIS — I10 Essential (primary) hypertension: Secondary | ICD-10-CM | POA: Insufficient documentation

## 2014-07-27 DIAGNOSIS — E039 Hypothyroidism, unspecified: Secondary | ICD-10-CM | POA: Insufficient documentation

## 2014-07-27 HISTORY — DX: Essential (primary) hypertension: I10

## 2014-07-27 HISTORY — DX: Polycystic ovarian syndrome: E28.2

## 2014-07-27 HISTORY — DX: Excessive and frequent menstruation with regular cycle: N92.0

## 2014-07-27 HISTORY — PX: LAPAROSCOPIC ASSISTED VAGINAL HYSTERECTOMY: SHX5398

## 2014-07-27 HISTORY — PX: BILATERAL SALPINGECTOMY: SHX5743

## 2014-07-27 HISTORY — DX: Hypothyroidism, unspecified: E03.9

## 2014-07-27 LAB — CBC
HEMATOCRIT: 38.4 % (ref 36.0–46.0)
Hemoglobin: 14 g/dL (ref 12.0–15.0)
MCH: 31.5 pg (ref 26.0–34.0)
MCHC: 36.5 g/dL — AB (ref 30.0–36.0)
MCV: 86.5 fL (ref 78.0–100.0)
PLATELETS: 219 10*3/uL (ref 150–400)
RBC: 4.44 MIL/uL (ref 3.87–5.11)
RDW: 12.1 % (ref 11.5–15.5)
WBC: 6.3 10*3/uL (ref 4.0–10.5)

## 2014-07-27 LAB — POCT I-STAT 4, (NA,K, GLUC, HGB,HCT)
GLUCOSE: 110 mg/dL — AB (ref 70–99)
HCT: 41 % (ref 36.0–46.0)
Hemoglobin: 13.9 g/dL (ref 12.0–15.0)
Potassium: 3.3 mEq/L — ABNORMAL LOW (ref 3.7–5.3)
Sodium: 138 mEq/L (ref 137–147)

## 2014-07-27 SURGERY — HYSTERECTOMY, VAGINAL, LAPAROSCOPY-ASSISTED
Anesthesia: General | Site: Vagina

## 2014-07-27 MED ORDER — FENTANYL CITRATE 0.05 MG/ML IJ SOLN
INTRAMUSCULAR | Status: AC
  Filled 2014-07-27: qty 2

## 2014-07-27 MED ORDER — LEVOTHYROXINE SODIUM 50 MCG PO TABS
50.0000 ug | ORAL_TABLET | Freq: Every day | ORAL | Status: DC
  Administered 2014-07-28: 50 ug via ORAL
  Filled 2014-07-27 (×2): qty 1

## 2014-07-27 MED ORDER — SODIUM CHLORIDE 0.9 % IJ SOLN
9.0000 mL | INTRAMUSCULAR | Status: DC | PRN
  Filled 2014-07-27: qty 9

## 2014-07-27 MED ORDER — ACETAMINOPHEN 10 MG/ML IV SOLN
INTRAVENOUS | Status: DC | PRN
  Administered 2014-07-27: 1000 mg via INTRAVENOUS

## 2014-07-27 MED ORDER — HYDROMORPHONE 0.3 MG/ML IV SOLN
INTRAVENOUS | Status: DC
  Administered 2014-07-27: 12:00:00 via INTRAVENOUS
  Administered 2014-07-27: 2.59 mg via INTRAVENOUS
  Administered 2014-07-27: 2.19 mg via INTRAVENOUS
  Administered 2014-07-28: 2.79 mg via INTRAVENOUS
  Administered 2014-07-28: 1.96 mg via INTRAVENOUS
  Administered 2014-07-28: 0.999 mg via INTRAVENOUS
  Filled 2014-07-27 (×3): qty 25

## 2014-07-27 MED ORDER — NEOSTIGMINE METHYLSULFATE 10 MG/10ML IV SOLN
INTRAVENOUS | Status: DC | PRN
  Administered 2014-07-27: 3 mg via INTRAVENOUS

## 2014-07-27 MED ORDER — NALOXONE HCL 0.4 MG/ML IJ SOLN
0.4000 mg | INTRAMUSCULAR | Status: DC | PRN
  Filled 2014-07-27: qty 1

## 2014-07-27 MED ORDER — FENTANYL CITRATE 0.05 MG/ML IJ SOLN
INTRAMUSCULAR | Status: AC
  Filled 2014-07-27: qty 6

## 2014-07-27 MED ORDER — LIDOCAINE HCL (CARDIAC) 20 MG/ML IV SOLN
INTRAVENOUS | Status: DC | PRN
Start: 2014-07-27 — End: 2014-07-27
  Administered 2014-07-27: 50 mg via INTRAVENOUS

## 2014-07-27 MED ORDER — MIDAZOLAM HCL 5 MG/5ML IJ SOLN
INTRAMUSCULAR | Status: DC | PRN
  Administered 2014-07-27: 2 mg via INTRAVENOUS

## 2014-07-27 MED ORDER — BUPIVACAINE HCL (PF) 0.25 % IJ SOLN
INTRAMUSCULAR | Status: DC | PRN
  Administered 2014-07-27: 10 mL

## 2014-07-27 MED ORDER — PROMETHAZINE HCL 25 MG/ML IJ SOLN
INTRAMUSCULAR | Status: AC
  Filled 2014-07-27: qty 1

## 2014-07-27 MED ORDER — LACTATED RINGERS IR SOLN
Status: DC | PRN
  Administered 2014-07-27: 3000 mL

## 2014-07-27 MED ORDER — LEVOTHYROXINE SODIUM 25 MCG PO TABS
25.0000 ug | ORAL_TABLET | Freq: Every day | ORAL | Status: DC
  Filled 2014-07-27: qty 1

## 2014-07-27 MED ORDER — TRAMADOL HCL 50 MG PO TABS
50.0000 mg | ORAL_TABLET | Freq: Four times a day (QID) | ORAL | Status: DC | PRN
  Filled 2014-07-27: qty 1

## 2014-07-27 MED ORDER — IBUPROFEN 200 MG PO TABS
600.0000 mg | ORAL_TABLET | Freq: Four times a day (QID) | ORAL | Status: DC | PRN
  Administered 2014-07-28: 600 mg via ORAL
  Filled 2014-07-27: qty 1
  Filled 2014-07-27: qty 3

## 2014-07-27 MED ORDER — KETOROLAC TROMETHAMINE 30 MG/ML IJ SOLN
INTRAMUSCULAR | Status: DC | PRN
  Administered 2014-07-27: 30 mg via INTRAVENOUS

## 2014-07-27 MED ORDER — CEFAZOLIN SODIUM-DEXTROSE 2-3 GM-% IV SOLR
2.0000 g | INTRAVENOUS | Status: AC
  Administered 2014-07-27: 2 g via INTRAVENOUS
  Filled 2014-07-27: qty 50

## 2014-07-27 MED ORDER — ONDANSETRON HCL 4 MG/2ML IJ SOLN
INTRAMUSCULAR | Status: DC | PRN
  Administered 2014-07-27: 4 mg via INTRAVENOUS

## 2014-07-27 MED ORDER — PROMETHAZINE HCL 25 MG/ML IJ SOLN
6.2500 mg | INTRAMUSCULAR | Status: DC | PRN
  Administered 2014-07-27: 6.25 mg via INTRAVENOUS
  Filled 2014-07-27: qty 1

## 2014-07-27 MED ORDER — HYDROCHLOROTHIAZIDE 25 MG PO TABS
25.0000 mg | ORAL_TABLET | Freq: Every morning | ORAL | Status: DC
  Administered 2014-07-28: 25 mg via ORAL
  Filled 2014-07-27 (×2): qty 1

## 2014-07-27 MED ORDER — CEFAZOLIN SODIUM-DEXTROSE 2-3 GM-% IV SOLR
INTRAVENOUS | Status: AC
  Filled 2014-07-27: qty 50

## 2014-07-27 MED ORDER — FENTANYL CITRATE 0.05 MG/ML IJ SOLN
INTRAMUSCULAR | Status: DC | PRN
  Administered 2014-07-27 (×3): 25 ug via INTRAVENOUS
  Administered 2014-07-27 (×2): 50 ug via INTRAVENOUS
  Administered 2014-07-27 (×2): 25 ug via INTRAVENOUS
  Administered 2014-07-27: 50 ug via INTRAVENOUS
  Administered 2014-07-27: 25 ug via INTRAVENOUS

## 2014-07-27 MED ORDER — MENTHOL 3 MG MT LOZG
1.0000 | LOZENGE | OROMUCOSAL | Status: DC | PRN
  Filled 2014-07-27: qty 9

## 2014-07-27 MED ORDER — DIPHENHYDRAMINE HCL 12.5 MG/5ML PO ELIX
12.5000 mg | ORAL_SOLUTION | Freq: Four times a day (QID) | ORAL | Status: DC | PRN
  Filled 2014-07-27: qty 5

## 2014-07-27 MED ORDER — DIPHENHYDRAMINE HCL 50 MG/ML IJ SOLN
12.5000 mg | Freq: Four times a day (QID) | INTRAMUSCULAR | Status: DC | PRN
  Filled 2014-07-27: qty 0.25

## 2014-07-27 MED ORDER — CETYLPYRIDINIUM CHLORIDE 0.05 % MT LIQD: 7.0000 mL | Freq: Two times a day (BID) | OROMUCOSAL | Status: DC

## 2014-07-27 MED ORDER — DEXTROSE IN LACTATED RINGERS 5 % IV SOLN
INTRAVENOUS | Status: DC
  Administered 2014-07-27 – 2014-07-28 (×3): via INTRAVENOUS
  Filled 2014-07-27: qty 1000

## 2014-07-27 MED ORDER — ONDANSETRON HCL 4 MG/2ML IJ SOLN
4.0000 mg | Freq: Four times a day (QID) | INTRAMUSCULAR | Status: DC | PRN
  Filled 2014-07-27: qty 2

## 2014-07-27 MED ORDER — KETOROLAC TROMETHAMINE 30 MG/ML IJ SOLN
30.0000 mg | Freq: Once | INTRAMUSCULAR | Status: DC
  Filled 2014-07-27: qty 1

## 2014-07-27 MED ORDER — LIDOCAINE-EPINEPHRINE 1 %-1:100000 IJ SOLN
INTRAMUSCULAR | Status: DC | PRN
Start: 2014-07-27 — End: 2014-07-27

## 2014-07-27 MED ORDER — PROPOFOL 10 MG/ML IV BOLUS
INTRAVENOUS | Status: DC | PRN
  Administered 2014-07-27: 180 mg via INTRAVENOUS

## 2014-07-27 MED ORDER — FENTANYL CITRATE 0.05 MG/ML IJ SOLN
25.0000 ug | INTRAMUSCULAR | Status: DC | PRN
  Administered 2014-07-27: 25 ug via INTRAVENOUS
  Administered 2014-07-27: 50 ug via INTRAVENOUS
  Administered 2014-07-27 (×3): 25 ug via INTRAVENOUS
  Filled 2014-07-27: qty 1

## 2014-07-27 MED ORDER — CHLORHEXIDINE GLUCONATE 0.12 % MT SOLN
15.0000 mL | Freq: Two times a day (BID) | OROMUCOSAL | Status: DC
  Administered 2014-07-28: 15 mL via OROMUCOSAL
  Filled 2014-07-27 (×4): qty 15

## 2014-07-27 MED ORDER — ROCURONIUM BROMIDE 100 MG/10ML IV SOLN
INTRAVENOUS | Status: DC | PRN
  Administered 2014-07-27: 15 mg via INTRAVENOUS
  Administered 2014-07-27: 10 mg via INTRAVENOUS
  Administered 2014-07-27: 25 mg via INTRAVENOUS

## 2014-07-27 MED ORDER — DEXAMETHASONE SODIUM PHOSPHATE 4 MG/ML IJ SOLN
INTRAMUSCULAR | Status: DC | PRN
  Administered 2014-07-27: 4 mg via INTRAVENOUS

## 2014-07-27 MED ORDER — SUCCINYLCHOLINE CHLORIDE 20 MG/ML IJ SOLN
INTRAMUSCULAR | Status: DC | PRN
  Administered 2014-07-27: 100 mg via INTRAVENOUS

## 2014-07-27 MED ORDER — LACTATED RINGERS IV SOLN
INTRAVENOUS | Status: DC
  Administered 2014-07-27 (×2): via INTRAVENOUS
  Filled 2014-07-27: qty 1000

## 2014-07-27 MED ORDER — LACTATED RINGERS IV SOLN
INTRAVENOUS | Status: DC
  Filled 2014-07-27: qty 1000

## 2014-07-27 MED ORDER — GLYCOPYRROLATE 0.2 MG/ML IJ SOLN
INTRAMUSCULAR | Status: DC | PRN
  Administered 2014-07-27: 0.4 mg via INTRAVENOUS

## 2014-07-27 MED ORDER — MIDAZOLAM HCL 2 MG/2ML IJ SOLN
INTRAMUSCULAR | Status: AC
  Filled 2014-07-27: qty 2

## 2014-07-27 SURGICAL SUPPLY — 86 items
ADH SKN CLS APL DERMABOND .7 (GAUZE/BANDAGES/DRESSINGS) ×2
APPLICATOR COTTON TIP 6IN STRL (MISCELLANEOUS) ×3 IMPLANT
BAG SPEC RTRVL LRG 6X4 10 (ENDOMECHANICALS)
BAG URINE DRAINAGE (UROLOGICAL SUPPLIES) ×3 IMPLANT
BANDAGE ADHESIVE 1X3 (GAUZE/BANDAGES/DRESSINGS) IMPLANT
BARRIER ADHS 3X4 INTERCEED (GAUZE/BANDAGES/DRESSINGS) IMPLANT
BLADE CLIPPER SURG (BLADE) ×2 IMPLANT
BLADE SURG 10 STRL SS (BLADE) ×1 IMPLANT
BLADE SURG 11 STRL SS (BLADE) ×3 IMPLANT
BLADE SURG 15 STRL LF DISP TIS (BLADE) IMPLANT
BLADE SURG 15 STRL SS (BLADE) ×3
BRR ADH 4X3 ABS CNTRL BYND (GAUZE/BANDAGES/DRESSINGS)
CANISTER SUCTION 2500CC (MISCELLANEOUS) IMPLANT
CATH FOLEY 2WAY SLVR  5CC 14FR (CATHETERS) ×1
CATH FOLEY 2WAY SLVR 5CC 14FR (CATHETERS) ×2 IMPLANT
CHLORAPREP W/TINT 26ML (MISCELLANEOUS) ×3 IMPLANT
CLOTH BEACON ORANGE TIMEOUT ST (SAFETY) ×3 IMPLANT
COVER TABLE BACK 60X90 (DRAPES) ×6 IMPLANT
DERMABOND ADVANCED (GAUZE/BANDAGES/DRESSINGS) ×1
DERMABOND ADVANCED .7 DNX12 (GAUZE/BANDAGES/DRESSINGS) IMPLANT
DRAPE CAMERA CLOSED 9X96 (DRAPES) ×3 IMPLANT
DRAPE LG THREE QUARTER DISP (DRAPES) ×3 IMPLANT
DRAPE UNDERBUTTOCKS STRL (DRAPE) ×3 IMPLANT
DRSG TELFA 3X8 NADH (GAUZE/BANDAGES/DRESSINGS) IMPLANT
ELECT REM PT RETURN 9FT ADLT (ELECTROSURGICAL) ×3
ELECTRODE REM PT RTRN 9FT ADLT (ELECTROSURGICAL) ×2 IMPLANT
FILTER SMOKE EVAC LAPAROSHD (FILTER) IMPLANT
GLOVE BIO SURGEON STRL SZ 6.5 (GLOVE) ×9 IMPLANT
GLOVE BIO SURGEON STRL SZ7 (GLOVE) ×3 IMPLANT
GLOVE BIOGEL M 6.5 STRL (GLOVE) ×2 IMPLANT
GLOVE BIOGEL M 7.0 STRL (GLOVE) ×1 IMPLANT
GLOVE BIOGEL PI IND STRL 6.5 (GLOVE) IMPLANT
GLOVE BIOGEL PI IND STRL 7.5 (GLOVE) IMPLANT
GLOVE BIOGEL PI INDICATOR 6.5 (GLOVE) ×2
GLOVE BIOGEL PI INDICATOR 7.5 (GLOVE) ×1
GOWN STRL REUS W/ TWL LRG LVL3 (GOWN DISPOSABLE) ×2 IMPLANT
GOWN STRL REUS W/TWL LRG LVL3 (GOWN DISPOSABLE) ×4 IMPLANT
GOWN STRL REUS W/TWL XL LVL3 (GOWN DISPOSABLE) ×1 IMPLANT
HOLDER FOLEY CATH W/STRAP (MISCELLANEOUS) ×3 IMPLANT
NDL HYPO 25X1 1.5 SAFETY (NEEDLE) ×2 IMPLANT
NDL INSUFFLATION 14GA 120MM (NEEDLE) IMPLANT
NDL INSUFFLATION 14GA 150MM (NEEDLE) IMPLANT
NDL SPNL 22GX3.5 QUINCKE BK (NEEDLE) ×2 IMPLANT
NEEDLE HYPO 25X1 1.5 SAFETY (NEEDLE) ×3 IMPLANT
NEEDLE INSUFFLATION 14GA 120MM (NEEDLE) IMPLANT
NEEDLE INSUFFLATION 14GA 150MM (NEEDLE) IMPLANT
NEEDLE SPNL 22GX3.5 QUINCKE BK (NEEDLE) ×3 IMPLANT
NS IRRIG 500ML POUR BTL (IV SOLUTION) ×3 IMPLANT
PACK BASIN DAY SURGERY FS (CUSTOM PROCEDURE TRAY) ×3 IMPLANT
PAD DRESSING TELFA 3X8 NADH (GAUZE/BANDAGES/DRESSINGS) IMPLANT
PAD OB MATERNITY 4.3X12.25 (PERSONAL CARE ITEMS) ×3 IMPLANT
PAD PREP 24X48 CUFFED NSTRL (MISCELLANEOUS) ×3 IMPLANT
PENCIL BUTTON HOLSTER BLD 10FT (ELECTRODE) ×3 IMPLANT
POUCH SPECIMEN RETRIEVAL 10MM (ENDOMECHANICALS) IMPLANT
SCISSORS LAP 5X35 DISP (ENDOMECHANICALS) IMPLANT
SEALER TISSUE G2 CVD JAW 45CM (ENDOMECHANICALS) ×3 IMPLANT
SET IRRIG TUBING LAPAROSCOPIC (IRRIGATION / IRRIGATOR) ×3 IMPLANT
SHEET LAVH (DRAPES) ×3 IMPLANT
SOLUTION ANTI FOG 6CC (MISCELLANEOUS) ×3 IMPLANT
SOLUTION ELECTROLUBE (MISCELLANEOUS) ×3 IMPLANT
SPONGE LAP 4X18 X RAY DECT (DISPOSABLE) ×3 IMPLANT
STRIP CLOSURE SKIN 1/4X4 (GAUZE/BANDAGES/DRESSINGS) ×3 IMPLANT
SUT VIC AB 0 CT1 18XCR BRD 8 (SUTURE) ×4 IMPLANT
SUT VIC AB 0 CT1 36 (SUTURE) ×9 IMPLANT
SUT VIC AB 0 CT1 8-18 (SUTURE) ×6
SUT VIC AB 3-0 PS2 18 (SUTURE) ×3
SUT VIC AB 3-0 PS2 18XBRD (SUTURE) ×2 IMPLANT
SUT VIC AB 3-0 SH 27 (SUTURE)
SUT VIC AB 3-0 SH 27X BRD (SUTURE) IMPLANT
SUT VICRYL 0 TIES 12 18 (SUTURE) ×3 IMPLANT
SUT VICRYL 0 UR6 27IN ABS (SUTURE) ×3 IMPLANT
SYR 3ML 23GX1 SAFETY (SYRINGE) IMPLANT
SYR BULB IRRIGATION 50ML (SYRINGE) ×3 IMPLANT
SYR CONTROL 10ML LL (SYRINGE) ×3 IMPLANT
SYRINGE 10CC LL (SYRINGE) ×3 IMPLANT
TOWEL NATURAL 6PK STERILE (DISPOSABLE) ×6 IMPLANT
TOWEL OR 17X24 6PK STRL BLUE (TOWEL DISPOSABLE) ×6 IMPLANT
TRAY DSU PREP LF (CUSTOM PROCEDURE TRAY) ×3 IMPLANT
TROCAR OPTI TIP 5M 100M (ENDOMECHANICALS) ×3 IMPLANT
TROCAR XCEL BLUNT TIP 100MML (ENDOMECHANICALS) IMPLANT
TROCAR XCEL DIL TIP R 11M (ENDOMECHANICALS) ×3 IMPLANT
TROCAR XCEL NON-BLD 11X100MML (ENDOMECHANICALS) IMPLANT
TUBE CONNECTING 12X1/4 (SUCTIONS) ×6 IMPLANT
TUBING INSUFFLATION 10FT LAP (TUBING) ×3 IMPLANT
WATER STERILE IRR 500ML POUR (IV SOLUTION) ×3 IMPLANT
YANKAUER SUCT BULB TIP NO VENT (SUCTIONS) ×3 IMPLANT

## 2014-07-27 NOTE — Progress Notes (Signed)
H and P on the chart No significant changes Will proceed with LAVH and Bilateral salpingectomy Consent signed 

## 2014-07-27 NOTE — Anesthesia Postprocedure Evaluation (Signed)
  Anesthesia Post-op Note  Patient: [name]  Procedure(s) Performed: Procedure(s) (LRB): LAPAROSCOPIC ASSISTED VAGINAL HYSTERECTOMY (N/A) BILATERAL SALPINGECTOMY (Bilateral)  Patient Location: PACU  Anesthesia Type: General  Level of Consciousness: awake and alert   Airway and Oxygen Therapy: Patient Spontanous Breathing  Post-op Pain: mild  Post-op Assessment: Post-op Vital signs reviewed, Patient's Cardiovascular Status Stable, Respiratory Function Stable, Patent Airway and No signs of Nausea or vomiting  Last Vitals:  Filed Vitals:   07/27/14 0923  BP: 120/78  Pulse: 67  Temp: 36.7 C  Resp: 16    Post-op Vital Signs: stable   Complications: No apparent anesthesia complications

## 2014-07-27 NOTE — Anesthesia Procedure Notes (Signed)
Procedure Name: Intubation Date/Time: 07/27/2014 7:42 AM Performed by: Denna Haggard D Pre-anesthesia Checklist: Patient identified, Emergency Drugs available, Suction available and Patient being monitored Patient Re-evaluated:Patient Re-evaluated prior to inductionOxygen Delivery Method: Circle System Utilized Preoxygenation: Pre-oxygenation with 100% oxygen Intubation Type: IV induction Ventilation: Mask ventilation without difficulty Laryngoscope Size: Mac and 3 Grade View: Grade I Tube type: Oral Tube size: 7.0 mm Number of attempts: 1 Airway Equipment and Method: stylet and oral airway Placement Confirmation: ETT inserted through vocal cords under direct vision,  positive ETCO2 and breath sounds checked- equal and bilateral Secured at: 21 cm Tube secured with: Tape Dental Injury: Teeth and Oropharynx as per pre-operative assessment

## 2014-07-27 NOTE — Brief Op Note (Signed)
07/27/2014  9:16 AM  PATIENT:  [name]  43 y.o. female  PRE-OPERATIVE DIAGNOSIS:  MENORRHAGIA  POST-OPERATIVE DIAGNOSIS:  MENORRHAGIA, cul de sac endometriosis  PROCEDURE:LAVH Bilateral salpingectomy Fulguration of cul de sac endometriosis SURGEON:  Surgeon(s) and Role:    * [name], MD - Primary    * Darlyn Chamber, MD - Assisting  PHYSICIAN ASSISTANT:   ASSISTANTS: none   ANESTHESIA:   general  EBL:  Total I/O In: 200 [I.V.:200] Out: 450 [Urine:100; Blood:350]  BLOOD ADMINISTERED:none  DRAINS: Urinary Catheter (Foley)   LOCAL MEDICATIONS USED:  LIDOCAINE   SPECIMEN:  Source of Specimen:  cervix, uterus, fallopian tubes  DISPOSITION OF SPECIMEN:  PATHOLOGY  COUNTS:  YES  TOURNIQUET:  * No tourniquets in log *  DICTATION: .Other Dictation: Dictation Number [id number]  PLAN OF CARE: Admit for overnight observation  PATIENT DISPOSITION:  PACU - hemodynamically stable.   Delay start of Pharmacological VTE agent (>24hrs) due to surgical blood loss or risk of bleeding: not applicable

## 2014-07-27 NOTE — Transfer of Care (Signed)
Immediate Anesthesia Transfer of Care Note  Patient: [name]  Procedure(s) Performed: Procedure(s) (LRB): LAPAROSCOPIC ASSISTED VAGINAL HYSTERECTOMY (N/A) BILATERAL SALPINGECTOMY (Bilateral)  Patient Location: PACU  Anesthesia Type: General  Level of Consciousness: awake, oriented, sedated and patient cooperative  Airway & Oxygen Therapy: Patient Spontanous Breathing and Patient connected to face mask oxygen  Post-op Assessment: Report given to PACU RN and Post -op Vital signs reviewed and stable  Post vital signs: Reviewed and stable  Complications: No apparent anesthesia complications

## 2014-07-28 ENCOUNTER — Encounter (HOSPITAL_BASED_OUTPATIENT_CLINIC_OR_DEPARTMENT_OTHER): Payer: Self-pay | Admitting: Obstetrics and Gynecology

## 2014-07-28 DIAGNOSIS — N920 Excessive and frequent menstruation with regular cycle: Secondary | ICD-10-CM | POA: Diagnosis not present

## 2014-07-28 LAB — CBC
HEMATOCRIT: 32.2 % — AB (ref 36.0–46.0)
Hemoglobin: 11.3 g/dL — ABNORMAL LOW (ref 12.0–15.0)
MCH: 31.6 pg (ref 26.0–34.0)
MCHC: 35.1 g/dL (ref 30.0–36.0)
MCV: 89.9 fL (ref 78.0–100.0)
Platelets: 169 10*3/uL (ref 150–400)
RBC: 3.58 MIL/uL — ABNORMAL LOW (ref 3.87–5.11)
RDW: 12.2 % (ref 11.5–15.5)
WBC: 11 10*3/uL — ABNORMAL HIGH (ref 4.0–10.5)

## 2014-07-28 LAB — BASIC METABOLIC PANEL
Anion gap: 14 (ref 5–15)
BUN: 9 mg/dL (ref 6–23)
CO2: 21 meq/L (ref 19–32)
Calcium: 8.7 mg/dL (ref 8.4–10.5)
Chloride: 100 mEq/L (ref 96–112)
Creatinine, Ser: 0.61 mg/dL (ref 0.50–1.10)
GFR calc Af Amer: >90 mL/min (ref >90)
GFR calc non Af Amer: >90 mL/min (ref >90)
Glucose, Bld: 157 mg/dL — ABNORMAL HIGH (ref 70–99)
Potassium: 4.3 mEq/L (ref 3.7–5.3)
Sodium: 135 mEq/L — ABNORMAL LOW (ref 137–147)

## 2014-07-28 LAB — POCT PREGNANCY, URINE: Preg Test, Ur: NEGATIVE

## 2014-07-28 MED ORDER — ONDANSETRON HCL 8 MG PO TABS
8.0000 mg | ORAL_TABLET | Freq: Once | ORAL | Status: AC
  Administered 2014-07-28: 8 mg via ORAL
  Filled 2014-07-28: qty 1
  Filled 2014-07-28: qty 2

## 2014-07-28 MED ORDER — OXYCODONE-ACETAMINOPHEN 5-325 MG PO TABS
1.0000 | ORAL_TABLET | ORAL | Status: DC | PRN
  Administered 2014-07-28 (×2): 1 via ORAL
  Administered 2014-07-28: 2 via ORAL
  Filled 2014-07-28: qty 1
  Filled 2014-07-28: qty 2
  Filled 2014-07-28: qty 1

## 2014-07-28 MED ORDER — IBUPROFEN 600 MG PO TABS: 600.0000 mg | ORAL_TABLET | Freq: Four times a day (QID) | ORAL | Status: DC | PRN

## 2014-07-28 MED ORDER — OXYCODONE-ACETAMINOPHEN 5-325 MG PO TABS: 1.0000 | ORAL_TABLET | ORAL | Status: DC | PRN

## 2014-07-28 NOTE — Progress Notes (Signed)
Discharge instructions given to patient along with prescriptions.

## 2014-07-28 NOTE — Progress Notes (Signed)
1 Day Post-Op Procedure(s) (LRB): LAPAROSCOPIC ASSISTED VAGINAL HYSTERECTOMY (N/A) BILATERAL SALPINGECTOMY (Bilateral)  Subjective: Patient reports nausea, tolerating PO and no problems voiding.    Objective: I have reviewed patient's vital signs, intake and output and medications.  General: alert, cooperative and appears stated age  Assessment: s/p Procedure(s): LAPAROSCOPIC ASSISTED VAGINAL HYSTERECTOMY (N/A) BILATERAL SALPINGECTOMY (Bilateral): stable, progressing well and tolerating diet  Plan: Advance diet Encourage ambulation Advance to PO medication Discontinue IV fluids Discharge home  LOS: 1 day    Yannick Steuber L 07/28/2014, 8:22 AM

## 2014-07-28 NOTE — Op Note (Signed)
[name], [name] NO.:  [id number]  MEDICAL RECORD NO.:  [id number]  LOCATION:  64                         FACILITY:  [facility]  PHYSICIAN:  [place] Aleesia Henney, M.D.DATE OF BIRTH:  Oct 23, 1971  DATE OF PROCEDURE:  07/27/2014 DATE OF DISCHARGE:                              OPERATIVE REPORT   PREOPERATIVE DIAGNOSIS:  Menorrhagia.  POSTOPERATIVE DIAGNOSIS:  Menorrhagia.  Cul-de-sac endometriosis.  SURGERY:  LAVH, bilateral salpingectomy, and fulguration of cul-de-sac endometriosis.  SURGEON:  Kathyjo Briere L. Helane Rima, M.D.  ASSISTANT:  Darlyn Chamber, M.D.  EBL:  350 mL.  URINE OUTPUT:  100 mL.  COMPLICATIONS:  None.  DRAINS:  Foley catheter.  PATHOLOGY:  Cervix, uterus, fallopian tubes sent to pathology.  PROCEDURE:  The patient was taken to the operating room.  She was intubated.  She was prepped and draped.  The uterine manipulator and Foley were inserted.  Time-out was performed.  A small infraumbilical incision was made.  A Veress needle was inserted.  Pneumoperitoneum was performed.  The Veress needle was removed and an 11-mm trocar was inserted under direct visualization.  The scope was introduced through the trocar sheath and inspection revealed a slightly enlarged uterus with a fibroid posteriorly consistent with her preoperative ultrasound. Ovaries appeared unremarkable, but she did have an adhesion involving the left ovary to the pelvic sidewall.  There was also 1 small endometriotic implant in the cul-de-sac.  We inserted a 5-mm trocar suprapubically under direct visualization, and then elevated the tube on the right with __________ grasper, placed an EnSeal across the mesosalpinx, carried that down to the round ligament on the right side and then on the left.  This was done with excellent hemostasis.  We did have to free the adhesions involving the left ovary to the sidewall, and that was easily done with blunt and sharp dissection.  The  cul-de-sac endometriosis was treated with Kleppinger's as well.  We then converted vaginally.  Inserted a weighted speculum in the vagina and made a circumferential incision around the cervix, entered the posterior cul-de- sac sharply using Metzenbaum scissors and anterior using Metzenbaum scissors.  We placed curved Heaney clamps across the uterosacral cardinal ligaments on either side.  Each pedicle was clamped, cut, and suture ligated using 0 Vicryl suture.  We walked our way up the broad ligament, staying snug beside the cervix and the uterus.  Each pedicle was clamped, cut, and suture ligated using 0 Vicryl suture.  Once we reached the level of the fundus, the uterus was easily retroflexed.  The remainder of the broad ligament was clamped on either side with curved Haney clamps.  The specimen was removed.  Identified the fallopian tubes, uterus, and cervix.  The pedicles were secured using 0 Vicryl suture.  Angle stitches were placed at 3 and 9 o'clock using 0 Vicryl suture at the vaginal cuff.  The posterior cuff was closed in a running locked stitch using 0 Vicryl suture.  Hemostasis was very good.  We then closed the cuff completely anterior to posterior using 0 Vicryl suture. I then changed my gloves, went back up to the abdomen, replaced the pneumoperitoneum, inspected the pelvis.  Irrigated  the pelvis.  There was no area of oozing or bleeding noted whatsoever.  We then released the pneumoperitoneum, observed the pelvis for hemostasis and then removed the trocars after as much gas was expelled.  We then closed the infraumbilical site using 0 Vicryl suture and the remainder of the sites using Dermabond.  All sponge, lap, instrument counts were correct x2. The patient went to recovery room in stable condition.     Nakiya Rallis L. Helane Rima, M.D.     Nevin Bloodgood  D:  07/27/2014  T:  07/27/2014  Job:  [id number]

## 2014-07-28 NOTE — Discharge Summary (Signed)
Admission Diagnosis: Menorrhagia  Discharge Diagnosis: Same  Hospital Course: 43 year old female with menorrhagia. Underwent LAVH, Bilateral salpingectomy. She had an uneventful post op course. By POD #1 she was ambulating, tolerating pos and voiding well. She went home on POD #1 in good condition. Discharge precautions good Post op hemoglobin was 11. Follow up in 1 week.

## 2014-08-20 ENCOUNTER — Other Ambulatory Visit: Payer: Self-pay | Admitting: Physician Assistant

## 2015-02-21 ENCOUNTER — Other Ambulatory Visit: Payer: Self-pay | Admitting: Physician Assistant

## 2015-02-24 ENCOUNTER — Other Ambulatory Visit: Payer: Self-pay | Admitting: Physician Assistant

## 2015-02-27 HISTORY — PX: BREAST BIOPSY: SHX20

## 2015-03-08 ENCOUNTER — Other Ambulatory Visit (HOSPITAL_COMMUNITY): Payer: Self-pay | Admitting: Obstetrics and Gynecology

## 2015-03-09 LAB — CYTOLOGY - PAP

## 2015-03-11 ENCOUNTER — Other Ambulatory Visit: Payer: Self-pay | Admitting: Obstetrics and Gynecology

## 2015-03-11 DIAGNOSIS — R928 Other abnormal and inconclusive findings on diagnostic imaging of breast: Secondary | ICD-10-CM

## 2015-03-16 ENCOUNTER — Other Ambulatory Visit: Payer: Self-pay | Admitting: Obstetrics and Gynecology

## 2015-03-16 ENCOUNTER — Ambulatory Visit
Admission: RE | Admit: 2015-03-16 | Discharge: 2015-03-16 | Disposition: A | Discharge status: Home / Self Care | Payer: Commercial Managed Care - PPO | Source: Ambulatory Visit | Attending: Obstetrics and Gynecology | Admitting: Obstetrics and Gynecology

## 2015-03-16 DIAGNOSIS — R928 Other abnormal and inconclusive findings on diagnostic imaging of breast: Secondary | ICD-10-CM

## 2015-03-17 ENCOUNTER — Other Ambulatory Visit: Payer: Self-pay | Admitting: Obstetrics and Gynecology

## 2015-03-17 DIAGNOSIS — R928 Other abnormal and inconclusive findings on diagnostic imaging of breast: Secondary | ICD-10-CM

## 2015-03-22 ENCOUNTER — Ambulatory Visit
Admission: RE | Admit: 2015-03-22 | Discharge: 2015-03-22 | Disposition: A | Discharge status: Home / Self Care | Payer: Commercial Managed Care - PPO | Source: Ambulatory Visit | Attending: Obstetrics and Gynecology | Admitting: Obstetrics and Gynecology

## 2015-03-22 ENCOUNTER — Other Ambulatory Visit: Payer: Self-pay | Admitting: Obstetrics and Gynecology

## 2015-03-22 ENCOUNTER — Ambulatory Visit: Admission: RE | Admit: 2015-03-22 | Payer: Commercial Managed Care - PPO | Source: Ambulatory Visit

## 2015-03-22 DIAGNOSIS — R928 Other abnormal and inconclusive findings on diagnostic imaging of breast: Secondary | ICD-10-CM

## 2015-03-22 DIAGNOSIS — N6310 Unspecified lump in the right breast, unspecified quadrant: Secondary | ICD-10-CM

## 2015-03-22 DIAGNOSIS — C50911 Malignant neoplasm of unspecified site of right female breast: Secondary | ICD-10-CM

## 2015-03-22 HISTORY — PX: OTHER SURGICAL HISTORY: SHX169

## 2015-03-22 HISTORY — DX: Malignant neoplasm of unspecified site of right female breast: C50.911

## 2015-03-23 ENCOUNTER — Telehealth: Payer: Self-pay | Admitting: Hematology

## 2015-03-23 NOTE — Telephone Encounter (Signed)
new patient appt-left message for patient to return call to schedule np appt

## 2015-03-24 ENCOUNTER — Telehealth: Payer: Self-pay | Admitting: Hematology

## 2015-03-24 NOTE — Telephone Encounter (Signed)
new patient appt-left message for patient to return call to schedule np appt

## 2015-03-25 ENCOUNTER — Telehealth: Payer: Self-pay | Admitting: Hematology

## 2015-03-25 NOTE — Telephone Encounter (Signed)
new patient appt-s/w patient and gave np appt for 06/27 @ 10:30 w/Dr. Burr Medico Referring Dr. Carol Ada Dx- Mild Abnormality  On von willebrands

## 2015-03-29 ENCOUNTER — Inpatient Hospital Stay: Admission: RE | Admit: 2015-03-29 | Payer: Commercial Managed Care - PPO | Source: Ambulatory Visit

## 2015-03-29 ENCOUNTER — Other Ambulatory Visit: Payer: Self-pay | Admitting: General Surgery

## 2015-03-29 ENCOUNTER — Telehealth: Payer: Self-pay | Admitting: *Deleted

## 2015-03-29 DIAGNOSIS — C50411 Malignant neoplasm of upper-outer quadrant of right female breast: Secondary | ICD-10-CM | POA: Insufficient documentation

## 2015-03-29 NOTE — Telephone Encounter (Signed)
[name] received a text from Dr. [name] at [facility] requesting for the pt to see Dr. Lindi Adie.  She received an appt from Dr. Lindi Adie and requested for me to call the pt w/ an appt to see him.  Called pt and explained why we needed to schedule her w/ Dr. Lindi Adie instead of her seeing Dr. Burr Medico and she was fine with that.  Confirmed 03/30/15 appt w/ Dr. Lindi Adie w/ pt. Unable to mail before appt letter - gave verbal.  Unable to mail welcoming packet - gave directions and instructions.  Unable to mail intake form - placed a note for one to be given at time of check in.  Cancelled Dr. Ernestina Penna appt. Informed Dawn so she can make Dr. [name] aware.

## 2015-03-30 ENCOUNTER — Ambulatory Visit (HOSPITAL_BASED_OUTPATIENT_CLINIC_OR_DEPARTMENT_OTHER): Payer: Commercial Managed Care - PPO | Admitting: Hematology and Oncology

## 2015-03-30 ENCOUNTER — Encounter: Payer: Self-pay | Admitting: Hematology and Oncology

## 2015-03-30 ENCOUNTER — Other Ambulatory Visit: Payer: Self-pay | Admitting: *Deleted

## 2015-03-30 ENCOUNTER — Ambulatory Visit: Payer: Commercial Managed Care - PPO

## 2015-03-30 ENCOUNTER — Telehealth: Payer: Self-pay | Admitting: Hematology and Oncology

## 2015-03-30 ENCOUNTER — Encounter: Payer: Self-pay | Admitting: *Deleted

## 2015-03-30 VITALS — BP 135/90 | HR 80 | Temp 97.9°F | Resp 18 | Ht 66.0 in | Wt 161.3 lb

## 2015-03-30 DIAGNOSIS — Z170 Estrogen receptor positive status [ER+]: Secondary | ICD-10-CM | POA: Diagnosis not present

## 2015-03-30 DIAGNOSIS — C50411 Malignant neoplasm of upper-outer quadrant of right female breast: Secondary | ICD-10-CM

## 2015-03-30 NOTE — Assessment & Plan Note (Signed)
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 After reviewing the pathology in detail, we proceeded to discuss the different treatment options between surgery, radiation, chemotherapy, antiestrogen therapies.  Recommendations: 1. Breast conserving surgery followed by 2. Oncotype DX testing to determine if chemotherapy would be of any benefit followed by 3. Adjuvant radiation therapy followed by 4. Adjuvant antiestrogen therapy  Oncotype counseling: I discussed Oncotype DX test. I explained to the patient that this is a 21 gene panel to evaluate patient tumors DNA to calculate recurrence score. This would help determine whether patient has high risk or intermediate risk or low risk breast cancer. She understands that if her tumor was found to be high risk, she would benefit from systemic chemotherapy. If low risk, no need of chemotherapy. If she was found to be intermediate risk, we would need to evaluate the score as well as other risk factors and determine if an abbreviated chemotherapy may be of benefit.  Von Willebrand factor deficiency: I do not have any reports or records of this condition. I would like to obtain these results. I discussed with her that she may have type I von Williebrand disease which is a mild deficiency and does certainly cause increased risk of gum bleeding. But majority of patients can be asymptomatic as well. If she has type I von Willebrand disease we could consider treatment with desmopressin preoperatively.  Return to clinic after surgery to discuss final pathology report and then determine if  Oncotype DX testing will need to be sent.

## 2015-03-30 NOTE — Progress Notes (Signed)
[place] CONSULT NOTE  Patient Care Team: Carol Ada, MD as PCP - General (Family Medicine)  CHIEF COMPLAINTS/PURPOSE OF CONSULTATION:  Newly diagnosed breast cancer  HISTORY OF PRESENTING ILLNESS:  [name] 44 y.o. female is here because of recent diagnosis of right-sided breast cancer. Patient had a routine screening mammogram on 03/16/2015 which led to ultrasound that revealed a right breast mass at 12:00 position measuring 1.4 cm. She underwent a biopsy on 03/22/2015 that revealed invasive ductal carcinoma with calcifications ER/PR positive HER-2 negative with a Ki-67 6%. She was seen by Dr. [name] who recommended lumpectomy. She was sent was for further discussion regarding treatment options. She was presented at the multidisciplinary tumor board. Patient states extremely busy with her 2 teenage children.  I reviewed her records extensively and collaborated the history with the patient.  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 Menorrhagia   . PCOS (polycystic ovarian syndrome)   . Hypertension   . Hypothyroidism     SURGICAL HISTORY: Past Surgical History  Procedure Laterality Date  . Cesarean section  10-09-2000  &  1999  . Appendectomy  02-21-2009  . Tonsillectomy  age 59  . Achilles tendon repair Right 2002  . Hysteroscopy with novasure  11/ 2014  . Laparoscopic assisted vaginal hysterectomy N/A 07/27/2014    Procedure: LAPAROSCOPIC ASSISTED VAGINAL HYSTERECTOMY;  Surgeon: [name], MD;   Location: [facility];  Service: Gynecology;  Laterality: N/A;  . Bilateral salpingectomy Bilateral 07/27/2014    Procedure: BILATERAL SALPINGECTOMY;  Surgeon: [name], MD;  Location: [facility];  Service: Gynecology;  Laterality: Bilateral;    SOCIAL HISTORY: History   Social History  . Marital Status: Married    Spouse Name: N/A  . Number of Children: N/A  . Years of Education: N/A   Occupational History  . Not on file.   Social History Main Topics  . Smoking status: Never Smoker   . Smokeless tobacco: Never Used  . Alcohol Use: 1.8 oz/week    3 Glasses of wine per week  . Drug Use: No  . Sexual Activity: Not on file   Other Topics Concern  . Not on file   Social History Narrative  . No narrative on file    FAMILY HISTORY: No family history on file.  ALLERGIES:  is allergic to ciprofloxacin and sulfa antibiotics.  MEDICATIONS:  Current Outpatient Prescriptions  Medication Sig Dispense Refill  . FeFum-FePoly-FA-B Cmp-C-Biot (INTEGRA PLUS PO) Take by mouth daily.    . hydrochlorothiazide (HYDRODIURIL) 25 MG tablet Take 25 mg by mouth every morning.    Marland Kitchen levothyroxine (SYNTHROID, LEVOTHROID) 50 MCG tablet Take 50 mcg by mouth daily before breakfast.    . metFORMIN (GLUCOPHAGE-XR) 500 MG 24 hr tablet Take 500 mg by mouth daily with breakfast.     No current facility-administered medications for this visit.    REVIEW OF SYSTEMS:   Constitutional: Denies fevers, chills or abnormal night sweats Eyes: Denies blurriness of vision, double vision  or watery eyes Ears, nose, mouth, throat, and face: Denies mucositis or sore throat Respiratory: Denies cough, dyspnea or wheezes Cardiovascular: Denies palpitation, chest discomfort or lower extremity swelling Gastrointestinal:  Denies nausea, heartburn or change in bowel habits Skin: Denies abnormal skin rashes Lymphatics: Denies new lymphadenopathy or easy bruising Neurological:Denies  numbness, tingling or new weaknesses Behavioral/Psych: Mood is stable, no new changes  Breast:  Denies any palpable lumps or discharge All other systems were reviewed with the patient and are negative.  PHYSICAL EXAMINATION: ECOG PERFORMANCE STATUS: 0 - Asymptomatic  Filed Vitals:   03/30/15 1250  BP: 135/90  Pulse: 80  Temp: 97.9 F (36.6 C)  Resp: 18   Filed Weights   03/30/15 1250  Weight: 161 lb 4.8 oz (73.165 kg)    GENERAL:alert, no distress and comfortable SKIN: skin color, texture, turgor are normal, no rashes or significant lesions EYES: normal, conjunctiva are pink and non-injected, sclera clear OROPHARYNX:no exudate, no erythema and lips, buccal mucosa, and tongue normal  NECK: supple, thyroid normal size, non-tender, without nodularity LYMPH:  no palpable lymphadenopathy in the cervical, axillary or inguinal LUNGS: clear to auscultation and percussion with normal breathing effort HEART: regular rate & rhythm and no murmurs and no lower extremity edema ABDOMEN:abdomen soft, non-tender and normal bowel sounds Musculoskeletal:no cyanosis of digits and no clubbing  PSYCH: alert & oriented x 3 with fluent speech NEURO: no focal motor/sensory deficits BREAST: No palpable nodules in breast. No palpable axillary or supraclavicular lymphadenopathy (exam performed in the presence of a chaperone)   LABORATORY DATA:  I have reviewed the data as listed Lab Results  Component Value Date   WBC 11.0* 07/28/2014   HGB 11.3* 07/28/2014   HCT 32.2* 07/28/2014   MCV 89.9 07/28/2014   PLT 169 07/28/2014   Lab Results  Component Value Date   NA 135* 07/27/2014   K 4.3 07/27/2014   CL 100 07/27/2014   CO2 21 07/27/2014    RADIOGRAPHIC STUDIES: I have personally reviewed the radiological reports and agreed with the findings in the report. 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 After reviewing the pathology in detail, we proceeded to discuss the different treatment options between surgery, radiation, chemotherapy, antiestrogen therapies.  Recommendations: 1. Breast conserving surgery followed by 2. Oncotype DX testing to determine if chemotherapy would be of any benefit followed by 3. Adjuvant radiation therapy followed by 4. Adjuvant antiestrogen therapy  Oncotype counseling: I discussed Oncotype DX test. I explained to the patient that this is a 21 gene panel to evaluate patient tumors DNA to calculate recurrence score. This would help determine whether patient has high risk or intermediate risk or low risk breast cancer. She understands that if her tumor was found to be high risk, she would benefit from systemic chemotherapy. If low risk, no need of chemotherapy. If she was found to be intermediate risk, we would need to evaluate the score as well as other risk factors and determine if an abbreviated chemotherapy may be of benefit.  Von Willebrand  factor deficiency: I do not have any reports or records of this condition. I would like to obtain these results. I discussed with her that she may have type I von Williebrand disease which is a mild deficiency and does certainly cause increased risk of gum bleeding. But majority of patients can be asymptomatic as well. If she has type I von Willebrand disease we could consider treatment with desmopressin preoperatively.  Return to clinic after surgery to discuss final pathology report and then determine if Oncotype DX  testing will need to be sent.  All questions were answered. The patient knows to call the clinic with any problems, questions or concerns.    Rulon Eisenmenger, MD 3:59 PM

## 2015-03-30 NOTE — Telephone Encounter (Signed)
Gave asv & calendar for June.

## 2015-03-30 NOTE — Progress Notes (Signed)
Checked in new pt with no financial concerns. °

## 2015-03-30 NOTE — Progress Notes (Signed)
Met with patient today at her new patient visit with Dr. Lindi Adie.  Discussed navigation resources and contact information given.  POF generated for genetic counseling appointment.  Encouraged patient to call with any needs or concerns.

## 2015-03-31 NOTE — Progress Notes (Signed)
Note created by Dr. Gudena during office visit. Copy to patient, original to scan. 

## 2015-04-01 ENCOUNTER — Other Ambulatory Visit: Payer: Self-pay | Admitting: *Deleted

## 2015-04-01 ENCOUNTER — Telehealth: Payer: Self-pay | Admitting: *Deleted

## 2015-04-01 NOTE — Telephone Encounter (Signed)
Received office notes/lab results from [facility], sent to scan.

## 2015-04-01 NOTE — Addendum Note (Signed)
Addended by: Nicholas Lose on: 04/01/2015 08:38 AM   Modules accepted: Orders

## 2015-04-04 ENCOUNTER — Ambulatory Visit
Admission: RE | Admit: 2015-04-04 | Discharge: 2015-04-04 | Disposition: A | Discharge status: Home / Self Care | Payer: Commercial Managed Care - PPO | Source: Ambulatory Visit | Attending: General Surgery | Admitting: General Surgery

## 2015-04-04 DIAGNOSIS — C50411 Malignant neoplasm of upper-outer quadrant of right female breast: Secondary | ICD-10-CM

## 2015-04-04 MED ORDER — GADOBENATE DIMEGLUMINE 529 MG/ML IV SOLN
15.0000 mL | Freq: Once | INTRAVENOUS | Status: AC | PRN
  Administered 2015-04-04: 15 mL via INTRAVENOUS

## 2015-04-06 ENCOUNTER — Encounter: Payer: Self-pay | Admitting: Genetic Counselor

## 2015-04-06 ENCOUNTER — Ambulatory Visit (HOSPITAL_BASED_OUTPATIENT_CLINIC_OR_DEPARTMENT_OTHER): Payer: Commercial Managed Care - PPO | Admitting: Genetic Counselor

## 2015-04-06 ENCOUNTER — Other Ambulatory Visit (HOSPITAL_BASED_OUTPATIENT_CLINIC_OR_DEPARTMENT_OTHER): Payer: Commercial Managed Care - PPO

## 2015-04-06 DIAGNOSIS — C50919 Malignant neoplasm of unspecified site of unspecified female breast: Secondary | ICD-10-CM | POA: Insufficient documentation

## 2015-04-06 DIAGNOSIS — C50411 Malignant neoplasm of upper-outer quadrant of right female breast: Secondary | ICD-10-CM | POA: Diagnosis not present

## 2015-04-06 DIAGNOSIS — Z803 Family history of malignant neoplasm of breast: Secondary | ICD-10-CM

## 2015-04-06 NOTE — Progress Notes (Signed)
REFERRING PROVIDER: Carol Ada, MD [place], [place] [address]   [name], MD  Nicholas Lose, MD  PRIMARY PROVIDER:  Reginia Naas, MD  PRIMARY REASON FOR VISIT:  1. Breast cancer of upper-outer quadrant of right female breast   2. Family history of breast cancer      HISTORY OF PRESENT ILLNESS:   Ms. Grassel, a 44 y.o. female, was seen for a [facility] cancer genetics consultation at the request of Dr. Lindi Adie due to a personal and family history of cancer.  Ms. Basques presents to clinic today to discuss the possibility of a hereditary predisposition to cancer, genetic testing, and to further clarify her future cancer risks, as well as potential cancer risks for family members.   In 2016, at the age of 58, Ms. Klinge was diagnosed with invasive ductal carcinoma of the right breast. The tumor is ER+/PR+/Her2-. This was treated with will be treated with surgery and radiation and tamoxifen.  Chemotherapy will be determined by Oncotype.   CANCER HISTORY:    Breast cancer of upper-outer quadrant of right female breast   03/16/2015 Mammogram  mammogram and ultrasound : right breast 12:00 position 1.4 x 1.3 x 1.2 cm hypoechoic irregular mass , right axillary lymph node with smooth cortical thickness 3 mm   03/22/2015 Initial Diagnosis  left breast biopsy: Invasive ductal carcinoma with calcifications , ER 98%, PR 98%, Ki-67 6%, HER-2 negative ratio 1.35     HORMONAL RISK FACTORS:  Menarche was at age 35.  First live birth at age 60.  OCP use for approximately 10-15 years.  Ovaries intact: yes.  Hysterectomy: yes.  Menopausal status: premenopausal.  HRT use: 0 years. Colonoscopy: no; not examined. Mammogram within the last year: yes. Number of breast biopsies: 1. Up to date with pelvic exams:  yes. Any excessive radiation exposure in the past:  no  Past Medical History  Diagnosis Date  . Menorrhagia   . PCOS (polycystic ovarian syndrome)    . Hypertension   . Hypothyroidism   . Breast cancer 2016    er+/PR+/her2-  . Family history of breast cancer     Past Surgical History  Procedure Laterality Date  . Cesarean section  10-09-2000  &  1999  . Appendectomy  02-21-2009  . Tonsillectomy  age 15  . Achilles tendon repair Right 2002  . Hysteroscopy with novasure  11/ 2014  . Laparoscopic assisted vaginal hysterectomy N/A 07/27/2014    Procedure: LAPAROSCOPIC ASSISTED VAGINAL HYSTERECTOMY;  Surgeon: [name], MD;  Location: [facility];  Service: Gynecology;  Laterality: N/A;  . Bilateral salpingectomy Bilateral 07/27/2014    Procedure: BILATERAL SALPINGECTOMY;  Surgeon: [name], MD;  Location[facility];  Service: Gynecology;  Laterality: Bilateral;    History   Social History  . Marital Status: Married    Spouse Name: N/A  . Number of Children: 2  . Years of Education: N/A   Social History Main Topics  . Smoking status: Never Smoker   . Smokeless tobacco: Never Used  . Alcohol Use: 1.8 oz/week    3 Glasses of wine per week  . Drug Use: No  . Sexual Activity: Not on file   Other Topics Concern  . Not on file   Social History Narrative     FAMILY HISTORY:  We obtained a detailed, 4-generation family history.  Significant diagnoses are listed below: Family History  Problem Relation Age of Onset  .  Breast cancer Maternal Aunt 28  . Lung cancer Maternal Uncle     smoker  . Prostate cancer Paternal Uncle   . Colon cancer Maternal Grandfather   . ALS Paternal Grandfather   . Breast cancer Other     MGM's 2 sisters possibly had breast cancer   Ms. Seckinger has one younger sister and two children, all who are cancer free.  Her parents are alive and never been diagnosed with cancer.  Her mother has two sisters and a brother. One sister had breast cancer at age 51.  Her maternal grandparents are deceased.  Her grandfather had colon cancer and died at 39.  Her  grandmother died of heart failure at 52.  Her grandmother had two sisters who possibly had breast cancer.  Ms. Hoar father had four brothers and one sister.  Two brothers had prostate cancer.  No other cancer history was reported. Patient's maternal ancestors are of [place] and [place] descent, and paternal ancestors are of [place] descent. There is no reported Ashkenazi Jewish ancestry. There is no known consanguinity.  GENETIC COUNSELING ASSESSMENT: GLENYS SNADER is a 44 y.o. female with breast cancer and has a family history of breast cancer which somewhat suggestive of a hereditary cancer syndrome and predisposition to cancer. We, therefore, discussed and recommended the following at today's visit.   DISCUSSION: We reviewed the characteristics, features and inheritance patterns of hereditary cancer syndromes. We reviewed hereditary cancer syndromes that are consistent with the family history including BRCA mutations, and possibly CHEK2.  We also discussed genetic testing, including the appropriate family members to test, the process of testing, insurance coverage and turn-around-time for results. We discussed the implications of a negative, positive and/or variant of uncertain significant result. We recommended Ms. Mosso pursue genetic testing for the OvaNext gene panel. The OvaNext gene panel offered by [facility] and includes sequencing and rearrangement analysis for the following 24 genes: ATM, BARD1, BRCA1, BRCA2, BRIP1, CDH1, CHEK2, EPCAM, MLH1, MRE11A, MSH2, MSH6, MUTYH, NBN, NF1, PALB2, PMS2, PTEN, RAD50, RAD51C, RAD51D, SMARCA4, STK11, and TP53.   Based on Ms. Ksiazek's personal and family history of cancer, she meets medical criteria for genetic testing. Despite that she meets criteria, she may still have an out of pocket cost. We discussed that if her out of pocket cost for testing is over $100, the laboratory will call and confirm whether she wants to proceed with testing.  If the out  of pocket cost of testing is less than $100 she will be billed by the genetic testing laboratory.   PLAN: After considering the risks, benefits, and limitations, Ms. Forquer  provided informed consent to pursue genetic testing and the blood sample was sent to [facility] for analysis of the OvaNext panel testing. Results should be available within approximately 2-3 weeks' time, at which point they will be disclosed by telephone to Ms. Obi, as will any additional recommendations warranted by these results. Ms. Sieh will receive a summary of her genetic counseling visit and a copy of her results once available. This information will also be available in Epic. We encouraged Ms. Cafaro to remain in contact with cancer genetics annually so that we can continuously update the family history and inform her of any changes in cancer genetics and testing that may be of benefit for her family. Ms. Bechtel questions were answered to her satisfaction today. Our contact information was provided should additional questions or concerns arise.  Lastly, we encouraged Ms. Kleiman to remain  in contact with cancer genetics annually so that we can continuously update the family history and inform her of any changes in cancer genetics and testing that may be of benefit for this family.   Ms.  Claycomb questions were answered to her satisfaction today. Our contact information was provided should additional questions or concerns arise. Thank you for the referral and allowing [place] to share in the care of your patient.   Kalayna Noy P. Florene Glen, [place], [facility] Certified Genetic Counselor [email or web address] phone: [phone]  The patient was seen for a total of 60 minutes in face-to-face genetic counseling.  This patient was discussed with Drs. Magrinat, Lindi Adie and/or Burr Medico who agrees with the above.    _______________________________________________________________________ For Office Staff:  Number of people involved  in session: 1 Was an Intern/ student involved with case: yes

## 2015-04-08 ENCOUNTER — Other Ambulatory Visit: Payer: Self-pay | Admitting: General Surgery

## 2015-04-08 ENCOUNTER — Other Ambulatory Visit: Payer: Self-pay | Admitting: *Deleted

## 2015-04-08 DIAGNOSIS — C50411 Malignant neoplasm of upper-outer quadrant of right female breast: Secondary | ICD-10-CM

## 2015-04-08 DIAGNOSIS — C50911 Malignant neoplasm of unspecified site of right female breast: Secondary | ICD-10-CM

## 2015-04-11 ENCOUNTER — Telehealth: Payer: Self-pay | Admitting: Hematology and Oncology

## 2015-04-11 ENCOUNTER — Encounter: Payer: Self-pay | Admitting: *Deleted

## 2015-04-11 NOTE — Telephone Encounter (Signed)
Spoke with patient and she is aware of her follow up °

## 2015-04-13 ENCOUNTER — Ambulatory Visit: Payer: Self-pay

## 2015-04-13 ENCOUNTER — Ambulatory Visit: Payer: Self-pay | Admitting: Radiation Oncology

## 2015-04-13 ENCOUNTER — Encounter: Payer: Self-pay | Admitting: Radiation Oncology

## 2015-04-13 LAB — VON WILLEBRAND FACTOR MULTIMER
Factor-VIII Activity: 71 % (ref 50–180)
RISTOCETIN CO-FACTOR: 47 % (ref 42–200)
Von Willebrand Factor Ag: 51 % (ref 50–217)

## 2015-04-13 NOTE — Progress Notes (Addendum)
Location of Breast Cancer:Right Breast, Upper Outer Quadrant  Histology per Pathology Report:   Diagnosis 03/22/15 Breast, right, needle core biopsy, upper outer right - INVASIVE DUCTAL CARCINOMA, SEE COMMENT. - CALCIFICATIONS PRESENT  Receptor Status: ER(98%), PR (98%), Her2-neu (-), Ki-67(6%)  [name] "underwent a rountine screening mammogram with tomosynthesis that showed c density breasts.  MM shows a persistent spiculation at 12 O'Clock.   There is some right axillary node cortical thickness. Underwent a biospy that showed ductal Carcinoma likely tubular morphology".    Past/Anticipated interventions by surgeon, if any: Dr. Keitha Butte - Biopsy of the right breast. Surgery scheduled 04/28/15  Past/Anticipated interventions by medical oncology, if any: Chemotherapy : Dr Nicholas Lose - consult on on 03/30/15.    Recommendations: 1. Breast conserving surgery followed by 2. Oncotype DX testing to determine if chemotherapy would be of any benefit followed by 3. Adjuvant radiation therapy followed by 4. Adjuvant antiestrogen therapy  Lymphedema issues, if any:  None  Pain issues, if any:No   SAFETY ISSUES:  Prior radiation? No  Pacemaker/ICD? NO  Possible current pregnancy?No  Is the patient on methotrexate?No  Current Complaints / other details:    Menarche age 26,G2,P2,first child age 74.  Menopause(hysterectomy September 2015.No HRT.    Deirdre Evener, RN 04/13/2015,3:27 PM

## 2015-04-14 ENCOUNTER — Ambulatory Visit: Payer: Self-pay | Admitting: Radiation Oncology

## 2015-04-14 ENCOUNTER — Ambulatory Visit
Admission: RE | Admit: 2015-04-14 | Discharge: 2015-04-14 | Disposition: A | Discharge status: Home / Self Care | Payer: Commercial Managed Care - PPO | Source: Ambulatory Visit | Attending: Radiation Oncology | Admitting: Radiation Oncology

## 2015-04-14 ENCOUNTER — Encounter: Payer: Self-pay | Admitting: Radiation Oncology

## 2015-04-14 VITALS — BP 137/94 | HR 79 | Temp 98.2°F | Wt 162.9 lb

## 2015-04-14 DIAGNOSIS — Z510 Encounter for antineoplastic radiation therapy: Secondary | ICD-10-CM | POA: Insufficient documentation

## 2015-04-14 DIAGNOSIS — C50911 Malignant neoplasm of unspecified site of right female breast: Secondary | ICD-10-CM | POA: Insufficient documentation

## 2015-04-14 DIAGNOSIS — Z170 Estrogen receptor positive status [ER+]: Secondary | ICD-10-CM | POA: Insufficient documentation

## 2015-04-14 DIAGNOSIS — C50411 Malignant neoplasm of upper-outer quadrant of right female breast: Secondary | ICD-10-CM

## 2015-04-14 HISTORY — DX: Malignant neoplasm of unspecified site of right female breast: C50.911

## 2015-04-14 NOTE — Progress Notes (Signed)
Please see the Nurse Progress Note in the MD Initial Consult Encounter for this patient. 

## 2015-04-14 NOTE — Progress Notes (Signed)
  Radiation Oncology         [phone]) [phone] ________________________________  Initial outpatient Consultation - Date: 04/14/2015   Name: [name] MRN: [id number]   DOB: November 27, 1970  REFERRING PHYSICIAN: Carol Ada, MD  DIAGNOSIS AND STAGE: TIcN0 Invasive Ductal Carcinoma of the Right Breast  HISTORY OF PRESENT ILLNESS::[name] is a 44 y.o. female who had a screen mammogram that showed a right breast mass. On ultrasound, this measured 1.4cm. She had a biopsy on 5/24 that showed invasive ductal carcinoma, ER/PR positive and Her2 negative. She had a MRI of the bilateral breast on 6/6 which showed a 2cm hematoma in the right breast. There were no other abnormal findings. Lymph nodes were negative. She had genetic testing and is awaiting the results. She is interested in breast conservation if possible. She is scheduled for surgery on 6/30. She met with Dr. Lindi Adie who discussed oncotype testing on the final surgical specimen. Shis accompanied by her husband today. She is still mensturating.  She is GxP2.   PREVIOUS RADIATION THERAPY: No  Past medical, social and family history were reviewed in the electronic chart. Review of symptoms was reviewed in the electronic chart. Medications were reviewed in the electronic chart.   PHYSICAL EXAM:  Filed Vitals:   04/14/15 1446  BP: 137/94  Pulse: 79  Temp: 98.2 F (36.8 C)  .162 lb 14.4 oz (73.891 kg). Pleasant female alert and oriented times three. No palpable cervical, supraclavicular, or axillary adenopathy bilaterally. She has a palpable right breast mass in the upper outer quadrant consistent with her known seroma.  IMPRESSION: TIcN0 Invasive Ductal Carcinoma of the Right Breast  PLAN: I spoke to the patient today regarding her diagnosis and options for treatment. We discussed the equivalence in terms of survival and local failure between mastectomy and breast conservation. We discussed the role of radiation in decreasing local failures  in patients who undergo lumpectomy. We discussed the process of simulation and the placement tattoos. We discussed 4-6 weeks of treatment as an outpatient. We discussed the possibility of asymptomatic lung damage. We discussed the low likelihood of secondary malignancies. We discussed the possible side effects including but not limited to skin redness, fatigue, permanent skin darkening, and breast swelling. We discussed the process of simulation and the placement of tattoos. I will see her back after her Oncotype score. I did clarify with her that if she needed chemotherapy this would be performed prior to radiation. I will plan on seeing her back after her surgery.  I spent 40 minutes  face to face with the patient and more than 50% of that time was spent in counseling and/or coordination of care.  This document serves as a record of services personally performed by Thea Silversmith, MD. It was created on her behalf by Darcus Austin, a trained medical scribe. The creation of this record is based on the scribe's personal observations and the provider's statements to them. This document has been checked and approved by the attending provider.   ------------------------------------------------  Thea Silversmith, MD

## 2015-04-21 ENCOUNTER — Other Ambulatory Visit: Payer: Self-pay | Admitting: Hematology and Oncology

## 2015-04-21 DIAGNOSIS — C50411 Malignant neoplasm of upper-outer quadrant of right female breast: Secondary | ICD-10-CM

## 2015-04-21 MED ORDER — DESMOPRESSIN ACE REFRIGERATED 0.01 % NA SOLN
1.0000 [drp] | Freq: Two times a day (BID) | NASAL | Status: DC
Start: 2015-04-21 — End: 2015-04-28

## 2015-04-22 ENCOUNTER — Encounter: Payer: Self-pay | Admitting: *Deleted

## 2015-04-22 ENCOUNTER — Encounter (HOSPITAL_BASED_OUTPATIENT_CLINIC_OR_DEPARTMENT_OTHER): Payer: Self-pay | Admitting: *Deleted

## 2015-04-22 NOTE — Progress Notes (Addendum)
Patient is currently being evaluated for possible Von Willebrand disease. Is to bring DDAVP nasal drops to use prior to surgery. Discussed pt with Dr. Kalman Shan. No further evaluation or orders indicated. Pt coming 6/27 or 6/28 for labs. EKG in chart from 06/2014.

## 2015-04-22 NOTE — Progress Notes (Signed)
[place] Psychosocial Distress Screening Clinical Social Work  Clinical Social Work was referred by distress screening protocol.  The patient scored a 5 on the Psychosocial Distress Thermometer which indicates moderate distress. Clinical Social Worker phoned pt to assess for distress and other psychosocial needs. CSW spoke briefly with pt as she was returning from [place] when CSW called. CSW provided pt with CSW contact info and pt plans to return call next week.   ONCBCN DISTRESS SCREENING 04/14/2015  Screening Type Initial Screening  Distress experienced in past week (1-10) 5  Practical problem type Work/school  Emotional problem type Nervousness/Anxiety;Adjusting to illness  Physical Problem type Sleep/insomnia;Other (comment)  Other may call [phone]    Clinical Social Worker follow up needed: Yes.    If yes, follow up plan: See above Loren Racer, [place] Worker [place]  [facility] Phone: [phone] Fax: [phone]

## 2015-04-25 ENCOUNTER — Ambulatory Visit: Payer: Commercial Managed Care - PPO

## 2015-04-25 ENCOUNTER — Ambulatory Visit: Payer: Commercial Managed Care - PPO | Admitting: Hematology

## 2015-04-25 ENCOUNTER — Ambulatory Visit
Admission: RE | Admit: 2015-04-25 | Discharge: 2015-04-25 | Disposition: A | Discharge status: Home / Self Care | Payer: Commercial Managed Care - PPO | Source: Ambulatory Visit | Attending: General Surgery | Admitting: General Surgery

## 2015-04-25 DIAGNOSIS — C50911 Malignant neoplasm of unspecified site of right female breast: Secondary | ICD-10-CM

## 2015-04-26 ENCOUNTER — Ambulatory Visit
Admission: RE | Admit: 2015-04-26 | Discharge: 2015-04-26 | Disposition: A | Discharge status: Home / Self Care | Payer: Commercial Managed Care - PPO | Source: Ambulatory Visit | Attending: General Surgery | Admitting: General Surgery

## 2015-04-26 ENCOUNTER — Encounter (HOSPITAL_BASED_OUTPATIENT_CLINIC_OR_DEPARTMENT_OTHER)
Admission: RE | Admit: 2015-04-26 | Discharge: 2015-04-26 | Disposition: A | Discharge status: Home / Self Care | Payer: Commercial Managed Care - PPO | Source: Ambulatory Visit | Attending: General Surgery | Admitting: General Surgery

## 2015-04-26 DIAGNOSIS — C50911 Malignant neoplasm of unspecified site of right female breast: Secondary | ICD-10-CM | POA: Diagnosis present

## 2015-04-26 DIAGNOSIS — I10 Essential (primary) hypertension: Secondary | ICD-10-CM | POA: Diagnosis not present

## 2015-04-26 DIAGNOSIS — E079 Disorder of thyroid, unspecified: Secondary | ICD-10-CM | POA: Diagnosis not present

## 2015-04-26 DIAGNOSIS — D0511 Intraductal carcinoma in situ of right breast: Secondary | ICD-10-CM | POA: Diagnosis not present

## 2015-04-26 DIAGNOSIS — C50411 Malignant neoplasm of upper-outer quadrant of right female breast: Secondary | ICD-10-CM

## 2015-04-26 DIAGNOSIS — Z90710 Acquired absence of both cervix and uterus: Secondary | ICD-10-CM | POA: Diagnosis not present

## 2015-04-26 DIAGNOSIS — Z881 Allergy status to other antibiotic agents status: Secondary | ICD-10-CM | POA: Diagnosis not present

## 2015-04-26 DIAGNOSIS — Z882 Allergy status to sulfonamides status: Secondary | ICD-10-CM | POA: Diagnosis not present

## 2015-04-26 DIAGNOSIS — Z79899 Other long term (current) drug therapy: Secondary | ICD-10-CM | POA: Diagnosis not present

## 2015-04-26 LAB — CBC WITH DIFFERENTIAL/PLATELET
Basophils Absolute: 0.1 10*3/uL (ref 0.0–0.1)
Basophils Relative: 1 % (ref 0–1)
Eosinophils Absolute: 0 10*3/uL (ref 0.0–0.7)
Eosinophils Relative: 0 % (ref 0–5)
HCT: 45.6 % (ref 36.0–46.0)
Hemoglobin: 16.6 g/dL — ABNORMAL HIGH (ref 12.0–15.0)
LYMPHS ABS: 2.3 10*3/uL (ref 0.7–4.0)
Lymphocytes Relative: 29 % (ref 12–46)
MCH: 32 pg (ref 26.0–34.0)
MCHC: 36.4 g/dL — AB (ref 30.0–36.0)
MCV: 87.9 fL (ref 78.0–100.0)
MONO ABS: 0.3 10*3/uL (ref 0.1–1.0)
MONOS PCT: 4 % (ref 3–12)
NEUTROS ABS: 5.3 10*3/uL (ref 1.7–7.7)
NEUTROS PCT: 66 % (ref 43–77)
PLATELETS: 242 10*3/uL (ref 150–400)
RBC: 5.19 MIL/uL — ABNORMAL HIGH (ref 3.87–5.11)
RDW: 13 % (ref 11.5–15.5)
WBC: 8 10*3/uL (ref 4.0–10.5)

## 2015-04-26 LAB — COMPREHENSIVE METABOLIC PANEL
ALK PHOS: 67 U/L (ref 38–126)
ALT: 29 U/L (ref 14–54)
ANION GAP: 8 (ref 5–15)
AST: 24 U/L (ref 15–41)
Albumin: 4.9 g/dL (ref 3.5–5.0)
BILIRUBIN TOTAL: 0.9 mg/dL (ref 0.3–1.2)
BUN: 8 mg/dL (ref 6–20)
CHLORIDE: 101 mmol/L (ref 101–111)
CO2: 29 mmol/L (ref 22–32)
CREATININE: 0.67 mg/dL (ref 0.44–1.00)
Calcium: 9.6 mg/dL (ref 8.9–10.3)
GFR calc Af Amer: >60 mL/min (ref >60)
GFR calc non Af Amer: >60 mL/min (ref >60)
Glucose, Bld: 146 mg/dL — ABNORMAL HIGH (ref 65–99)
Potassium: 3.5 mmol/L (ref 3.5–5.1)
Sodium: 138 mmol/L (ref 135–145)
Total Protein: 7.8 g/dL (ref 6.5–8.1)

## 2015-04-27 ENCOUNTER — Encounter: Payer: Self-pay | Admitting: Genetic Counselor

## 2015-04-27 ENCOUNTER — Telehealth: Payer: Self-pay | Admitting: Genetic Counselor

## 2015-04-27 DIAGNOSIS — Z803 Family history of malignant neoplasm of breast: Secondary | ICD-10-CM

## 2015-04-27 DIAGNOSIS — C50411 Malignant neoplasm of upper-outer quadrant of right female breast: Secondary | ICD-10-CM

## 2015-04-27 DIAGNOSIS — Z1379 Encounter for other screening for genetic and chromosomal anomalies: Secondary | ICD-10-CM | POA: Insufficient documentation

## 2015-04-27 NOTE — Telephone Encounter (Signed)
Revealed negative genetic testing on the OvaNext panel test.  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  Send a copy of her test results via secure email portal.

## 2015-04-27 NOTE — Progress Notes (Signed)
HPI: [name] was previously seen in the [place] clinic due to a personal and family history of cancer and concerns regarding a hereditary predisposition to cancer. Please refer to our prior cancer genetics clinic note for more information regarding [name]'s medical, social and family histories, and our assessment and recommendations, at the time. [name] recent genetic test results were disclosed to her, as were recommendations warranted by these results. These results and recommendations are discussed in more detail below.  GENETIC TEST RESULTS: At the time of [name]'s visit, we recommended she pursue genetic testing of the OvaNext gene panel. The OvaNext gene panel offered by [facility] and includes sequencing and rearrangement analysis for the following 24 genes: ATM, BARD1, BRCA1, BRCA2, BRIP1, CDH1, CHEK2, EPCAM, MLH1, MRE11A, MSH2, MSH6, MUTYH, NBN, NF1, PALB2, PMS2, PTEN, RAD50, RAD51C, RAD51D, SMARCA4, STK11, and TP53. The report date is April 26, 2015.  Genetic testing was normal, and did not reveal a deleterious mutation in these genes. The test report has been scanned into EPIC and is located under the Media tab.   We discussed with [name] that since the current genetic testing is not perfect, it is possible there may be a gene mutation in one of these genes that current testing cannot detect, but that chance is small. We also discussed, that it is possible that another gene that has not yet been discovered, or that we have not yet tested, is responsible for the cancer diagnoses in the family, and it is, therefore, important to remain in touch with cancer genetics in the future so that we can continue to offer [name] the most up to date genetic testing.   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 At this time, it is unknown if this variant is associated with increased cancer risk or if this is a normal  finding, but most variants such as this get reclassified to being inconsequential. It should not be used to make medical management decisions. With time, we suspect the lab will determine the significance of this variant, if any. If we do learn more about it, we will try to contact [name] to discuss it further. However, it is important to stay in touch with [place] periodically and keep the address and phone number up to date.   CANCER SCREENING RECOMMENDATIONS: This result is reassuring and indicates that [name] likely does not have an increased risk for a future cancer due to a mutation in one of these genes. This normal test also suggests that [name]'s cancer was most likely not due to an inherited predisposition associated with one of these genes.  Most cancers happen by chance and this negative test suggests that her cancer falls into this category.  We, therefore, recommended she continue to follow the cancer management and screening guidelines provided by her oncology and primary healthcare provider.   RECOMMENDATIONS FOR FAMILY MEMBERS: Women in this family might be at some increased risk of developing cancer, over the general population risk, simply due to the family history of cancer. We recommended women in this family have a yearly mammogram beginning at age 27, or 19 years younger than the earliest onset of cancer, an an annual clinical breast exam, and perform monthly breast self-exams. Women in this family should also have a gynecological exam as recommended by their primary provider. All family members should have a colonoscopy by age 41.  Based on  [name] family history, we recommended her aunt, who was diagnosed with breast cancer at age 89, have genetic counseling and testing. [name] will let us know if we can be of any assistance in coordinating genetic counseling and/or testing for this family member.   FOLLOW-UP: Lastly, we discussed with [name] that cancer genetics is a  rapidly advancing field and it is possible that new genetic tests will be appropriate for her and/or her family members in the future. We encouraged her to remain in contact with cancer genetics on an annual basis so we can update her personal and family histories and let her know of advances in cancer genetics that may benefit this family.   Our contact number was provided. [name] questions were answered to her satisfaction, and she knows she is welcome to call us at anytime with additional questions or concerns.   Roma Kayser, MS, [facility] Certified Genetic Counselor [email or web address]

## 2015-04-28 ENCOUNTER — Ambulatory Visit (HOSPITAL_BASED_OUTPATIENT_CLINIC_OR_DEPARTMENT_OTHER)
Admission: RE | Admit: 2015-04-28 | Discharge: 2015-04-28 | Disposition: A | Discharge status: Home / Self Care | Payer: Commercial Managed Care - PPO | Source: Ambulatory Visit | Attending: General Surgery | Admitting: General Surgery

## 2015-04-28 ENCOUNTER — Encounter (HOSPITAL_BASED_OUTPATIENT_CLINIC_OR_DEPARTMENT_OTHER): Payer: Self-pay | Admitting: *Deleted

## 2015-04-28 ENCOUNTER — Ambulatory Visit (HOSPITAL_BASED_OUTPATIENT_CLINIC_OR_DEPARTMENT_OTHER): Payer: Commercial Managed Care - PPO | Admitting: Anesthesiology

## 2015-04-28 ENCOUNTER — Ambulatory Visit (HOSPITAL_COMMUNITY)
Admission: RE | Admit: 2015-04-28 | Discharge: 2015-04-28 | Disposition: A | Discharge status: Home / Self Care | Payer: Commercial Managed Care - PPO | Source: Ambulatory Visit | Attending: General Surgery | Admitting: General Surgery

## 2015-04-28 ENCOUNTER — Encounter (HOSPITAL_BASED_OUTPATIENT_CLINIC_OR_DEPARTMENT_OTHER)
Admission: RE | Disposition: A | Discharge status: Home / Self Care | Payer: Self-pay | Source: Ambulatory Visit | Attending: General Surgery

## 2015-04-28 ENCOUNTER — Ambulatory Visit
Admission: RE | Admit: 2015-04-28 | Discharge: 2015-04-28 | Disposition: A | Discharge status: Home / Self Care | Payer: Commercial Managed Care - PPO | Source: Ambulatory Visit | Attending: General Surgery | Admitting: General Surgery

## 2015-04-28 DIAGNOSIS — Z881 Allergy status to other antibiotic agents status: Secondary | ICD-10-CM | POA: Insufficient documentation

## 2015-04-28 DIAGNOSIS — Z79899 Other long term (current) drug therapy: Secondary | ICD-10-CM | POA: Insufficient documentation

## 2015-04-28 DIAGNOSIS — D0511 Intraductal carcinoma in situ of right breast: Secondary | ICD-10-CM | POA: Diagnosis not present

## 2015-04-28 DIAGNOSIS — C50411 Malignant neoplasm of upper-outer quadrant of right female breast: Secondary | ICD-10-CM

## 2015-04-28 DIAGNOSIS — Z882 Allergy status to sulfonamides status: Secondary | ICD-10-CM | POA: Insufficient documentation

## 2015-04-28 DIAGNOSIS — E079 Disorder of thyroid, unspecified: Secondary | ICD-10-CM | POA: Insufficient documentation

## 2015-04-28 DIAGNOSIS — Z90710 Acquired absence of both cervix and uterus: Secondary | ICD-10-CM | POA: Insufficient documentation

## 2015-04-28 DIAGNOSIS — I10 Essential (primary) hypertension: Secondary | ICD-10-CM | POA: Insufficient documentation

## 2015-04-28 HISTORY — PX: BREAST LUMPECTOMY: SHX2

## 2015-04-28 HISTORY — PX: RADIOACTIVE SEED GUIDED PARTIAL MASTECTOMY WITH AXILLARY SENTINEL LYMPH NODE BIOPSY: SHX6520

## 2015-04-28 SURGERY — RADIOACTIVE SEED GUIDED PARTIAL MASTECTOMY WITH AXILLARY SENTINEL LYMPH NODE BIOPSY
Anesthesia: General | Site: Breast | Laterality: Right

## 2015-04-28 MED ORDER — PROMETHAZINE HCL 25 MG/ML IJ SOLN
6.2500 mg | INTRAMUSCULAR | Status: DC | PRN
Start: 2015-04-28 — End: 2015-04-28

## 2015-04-28 MED ORDER — CEFAZOLIN SODIUM-DEXTROSE 2-3 GM-% IV SOLR
INTRAVENOUS | Status: AC
  Filled 2015-04-28: qty 50

## 2015-04-28 MED ORDER — DEXAMETHASONE SODIUM PHOSPHATE 4 MG/ML IJ SOLN
INTRAMUSCULAR | Status: DC | PRN
  Administered 2015-04-28: 10 mg via INTRAVENOUS

## 2015-04-28 MED ORDER — SUCCINYLCHOLINE CHLORIDE 20 MG/ML IJ SOLN
INTRAMUSCULAR | Status: AC
  Filled 2015-04-28: qty 2

## 2015-04-28 MED ORDER — OXYCODONE HCL 5 MG PO TABS
ORAL_TABLET | ORAL | Status: AC
  Filled 2015-04-28: qty 1

## 2015-04-28 MED ORDER — SUFENTANIL CITRATE 50 MCG/ML IV SOLN
INTRAVENOUS | Status: DC | PRN
  Administered 2015-04-28: 20 ug via INTRAVENOUS

## 2015-04-28 MED ORDER — ONDANSETRON HCL 4 MG/2ML IJ SOLN
INTRAMUSCULAR | Status: DC | PRN
  Administered 2015-04-28: 4 mg via INTRAVENOUS

## 2015-04-28 MED ORDER — MIDAZOLAM HCL 2 MG/2ML IJ SOLN
INTRAMUSCULAR | Status: AC
  Filled 2015-04-28: qty 2

## 2015-04-28 MED ORDER — SCOPOLAMINE 1 MG/3DAYS TD PT72
MEDICATED_PATCH | TRANSDERMAL | Status: AC
  Filled 2015-04-28: qty 1

## 2015-04-28 MED ORDER — FENTANYL CITRATE (PF) 100 MCG/2ML IJ SOLN
INTRAMUSCULAR | Status: AC
Start: 2015-04-28 — End: 2015-04-28
  Filled 2015-04-28: qty 2

## 2015-04-28 MED ORDER — HYDROMORPHONE HCL 1 MG/ML IJ SOLN
0.2500 mg | INTRAMUSCULAR | Status: DC | PRN
  Administered 2015-04-28 (×2): 0.5 mg via INTRAVENOUS

## 2015-04-28 MED ORDER — SUFENTANIL CITRATE 50 MCG/ML IV SOLN
INTRAVENOUS | Status: AC
  Filled 2015-04-28: qty 1

## 2015-04-28 MED ORDER — BUPIVACAINE-EPINEPHRINE (PF) 0.25% -1:200000 IJ SOLN
INTRAMUSCULAR | Status: AC
  Filled 2015-04-28: qty 30

## 2015-04-28 MED ORDER — LACTATED RINGERS IV SOLN
INTRAVENOUS | Status: DC
  Administered 2015-04-28: 07:00:00 via INTRAVENOUS

## 2015-04-28 MED ORDER — SCOPOLAMINE 1 MG/3DAYS TD PT72
1.0000 | MEDICATED_PATCH | Freq: Once | TRANSDERMAL | Status: DC | PRN
  Administered 2015-04-28: 1.5 mg via TRANSDERMAL

## 2015-04-28 MED ORDER — PROPOFOL 10 MG/ML IV BOLUS
INTRAVENOUS | Status: AC
  Filled 2015-04-28: qty 60

## 2015-04-28 MED ORDER — OXYCODONE HCL 5 MG PO TABS
5.0000 mg | ORAL_TABLET | Freq: Once | ORAL | Status: AC | PRN
  Administered 2015-04-28: 5 mg via ORAL

## 2015-04-28 MED ORDER — METHYLENE BLUE 1 % INJ SOLN
INTRAMUSCULAR | Status: AC
  Filled 2015-04-28: qty 10

## 2015-04-28 MED ORDER — HYDROMORPHONE HCL 1 MG/ML IJ SOLN
INTRAMUSCULAR | Status: AC
  Filled 2015-04-28: qty 1

## 2015-04-28 MED ORDER — SODIUM CHLORIDE 0.9 % IJ SOLN
INTRAMUSCULAR | Status: AC
  Filled 2015-04-28: qty 10

## 2015-04-28 MED ORDER — BUPIVACAINE HCL (PF) 0.25 % IJ SOLN
INTRAMUSCULAR | Status: AC
  Filled 2015-04-28: qty 30

## 2015-04-28 MED ORDER — BUPIVACAINE HCL (PF) 0.25 % IJ SOLN
INTRAMUSCULAR | Status: DC | PRN
  Administered 2015-04-28: 8 mL

## 2015-04-28 MED ORDER — CEFAZOLIN SODIUM-DEXTROSE 2-3 GM-% IV SOLR
2.0000 g | INTRAVENOUS | Status: AC
  Administered 2015-04-28: 2 g via INTRAVENOUS

## 2015-04-28 MED ORDER — OXYCODONE-ACETAMINOPHEN 10-325 MG PO TABS: 1.0000 | ORAL_TABLET | Freq: Four times a day (QID) | ORAL | Status: DC | PRN

## 2015-04-28 MED ORDER — TECHNETIUM TC 99M SULFUR COLLOID FILTERED
1.0000 | Freq: Once | INTRAVENOUS | Status: AC | PRN
  Administered 2015-04-28: 1 via INTRADERMAL

## 2015-04-28 MED ORDER — GLYCOPYRROLATE 0.2 MG/ML IJ SOLN: 0.2000 mg | Freq: Once | INTRAMUSCULAR | Status: DC | PRN

## 2015-04-28 MED ORDER — BUPIVACAINE-EPINEPHRINE (PF) 0.5% -1:200000 IJ SOLN
INTRAMUSCULAR | Status: DC | PRN
  Administered 2015-04-28: 30 mL

## 2015-04-28 MED ORDER — FENTANYL CITRATE (PF) 100 MCG/2ML IJ SOLN
50.0000 ug | INTRAMUSCULAR | Status: DC | PRN
  Administered 2015-04-28: 100 ug via INTRAVENOUS

## 2015-04-28 MED ORDER — MIDAZOLAM HCL 2 MG/2ML IJ SOLN
1.0000 mg | INTRAMUSCULAR | Status: DC | PRN
  Administered 2015-04-28 (×2): 2 mg via INTRAVENOUS

## 2015-04-28 MED ORDER — PROPOFOL 500 MG/50ML IV EMUL
INTRAVENOUS | Status: AC
  Filled 2015-04-28: qty 50

## 2015-04-28 MED ORDER — PROPOFOL 10 MG/ML IV BOLUS
INTRAVENOUS | Status: DC | PRN
  Administered 2015-04-28: 200 mg via INTRAVENOUS

## 2015-04-28 SURGICAL SUPPLY — 61 items
APPLIER CLIP 9.375 MED OPEN (MISCELLANEOUS) ×2
APR CLP MED 9.3 20 MLT OPN (MISCELLANEOUS) ×1
BINDER BREAST LRG (GAUZE/BANDAGES/DRESSINGS) ×1 IMPLANT
BINDER BREAST MEDIUM (GAUZE/BANDAGES/DRESSINGS) IMPLANT
BINDER BREAST XLRG (GAUZE/BANDAGES/DRESSINGS) IMPLANT
BINDER BREAST XXLRG (GAUZE/BANDAGES/DRESSINGS) IMPLANT
BLADE SURG 15 STRL LF DISP TIS (BLADE) ×1 IMPLANT
BLADE SURG 15 STRL SS (BLADE) ×2
CANISTER SUC SOCK COL 7IN (MISCELLANEOUS) IMPLANT
CANISTER SUCT 1200ML W/VALVE (MISCELLANEOUS) IMPLANT
CHLORAPREP W/TINT 26ML (MISCELLANEOUS) ×2 IMPLANT
CLIP APPLIE 9.375 MED OPEN (MISCELLANEOUS) ×1 IMPLANT
COVER BACK TABLE 60X90IN (DRAPES) ×2 IMPLANT
COVER MAYO STAND STRL (DRAPES) ×2 IMPLANT
COVER PROBE W GEL 5X96 (DRAPES) ×2 IMPLANT
DECANTER SPIKE VIAL GLASS SM (MISCELLANEOUS) IMPLANT
DEVICE DUBIN W/COMP PLATE 8390 (MISCELLANEOUS) ×2 IMPLANT
DRAPE LAPAROSCOPIC ABDOMINAL (DRAPES) ×2 IMPLANT
DRSG TEGADERM 4X4.75 (GAUZE/BANDAGES/DRESSINGS) IMPLANT
ELECT COATED BLADE 2.86 ST (ELECTRODE) ×2 IMPLANT
ELECT REM PT RETURN 9FT ADLT (ELECTROSURGICAL) ×2
ELECTRODE REM PT RTRN 9FT ADLT (ELECTROSURGICAL) ×1 IMPLANT
GLOVE BIO SURGEON STRL SZ7 (GLOVE) ×8 IMPLANT
GLOVE BIOGEL PI IND STRL 7.5 (GLOVE) ×1 IMPLANT
GLOVE BIOGEL PI INDICATOR 7.5 (GLOVE) ×1
GLOVE ECLIPSE 6.5 STRL STRAW (GLOVE) ×1 IMPLANT
GLOVE SURG SS PI 7.0 STRL IVOR (GLOVE) ×1 IMPLANT
GOWN STRL REUS W/ TWL LRG LVL3 (GOWN DISPOSABLE) ×2 IMPLANT
GOWN STRL REUS W/TWL LRG LVL3 (GOWN DISPOSABLE) ×6
ILLUMINATOR WAVEGUIDE N/F (MISCELLANEOUS) ×1 IMPLANT
KIT MARKER MARGIN INK (KITS) ×2 IMPLANT
LIQUID BAND (GAUZE/BANDAGES/DRESSINGS) ×2 IMPLANT
MARKER SKIN DUAL TIP RULER LAB (MISCELLANEOUS) ×4 IMPLANT
NDL HYPO 25X1 1.5 SAFETY (NEEDLE) ×1 IMPLANT
NDL SAFETY ECLIPSE 18X1.5 (NEEDLE) IMPLANT
NEEDLE HYPO 18GX1.5 SHARP (NEEDLE)
NEEDLE HYPO 25X1 1.5 SAFETY (NEEDLE) ×2 IMPLANT
NS IRRIG 1000ML POUR BTL (IV SOLUTION) IMPLANT
PACK BASIN DAY SURGERY FS (CUSTOM PROCEDURE TRAY) ×2 IMPLANT
PENCIL BUTTON HOLSTER BLD 10FT (ELECTRODE) ×2 IMPLANT
SHEET MEDIUM DRAPE 40X70 STRL (DRAPES) IMPLANT
SLEEVE SCD COMPRESS KNEE MED (MISCELLANEOUS) ×2 IMPLANT
SPONGE GAUZE 4X4 12PLY STER LF (GAUZE/BANDAGES/DRESSINGS) ×2 IMPLANT
SPONGE LAP 4X18 X RAY DECT (DISPOSABLE) ×2 IMPLANT
STAPLER VISISTAT 35W (STAPLE) ×2 IMPLANT
STOCKINETTE IMPERVIOUS LG (DRAPES) IMPLANT
STRIP CLOSURE SKIN 1/2X4 (GAUZE/BANDAGES/DRESSINGS) ×2 IMPLANT
SUT ETHILON 2 0 FS 18 (SUTURE) IMPLANT
SUT MNCRL AB 4-0 PS2 18 (SUTURE) ×2 IMPLANT
SUT MON AB 5-0 PS2 18 (SUTURE) ×1 IMPLANT
SUT SILK 2 0 SH (SUTURE) IMPLANT
SUT VIC AB 2-0 SH 27 (SUTURE) ×6
SUT VIC AB 2-0 SH 27XBRD (SUTURE) ×1 IMPLANT
SUT VIC AB 3-0 SH 27 (SUTURE) ×4
SUT VIC AB 3-0 SH 27X BRD (SUTURE) ×1 IMPLANT
SUT VIC AB 5-0 PS2 18 (SUTURE) IMPLANT
SYR CONTROL 10ML LL (SYRINGE) ×2 IMPLANT
TOWEL OR 17X24 6PK STRL BLUE (TOWEL DISPOSABLE) ×2 IMPLANT
TOWEL OR NON WOVEN STRL DISP B (DISPOSABLE) ×2 IMPLANT
TUBE CONNECTING 20X1/4 (TUBING) ×2 IMPLANT
YANKAUER SUCT BULB TIP NO VENT (SUCTIONS) ×1 IMPLANT

## 2015-04-28 NOTE — H&P (Signed)
44 yof who underwent routine screening mm (no discharge, no masses) with tomosynthesis that showed c density breasts. MM shows a persistent spiculation at 12 oclock. there is some right axillary node cortical prominence. On [place] there is a mass 12 oclock 4 cm from nac that measures 1.4x1.3x1.2 cm. the right axillary nodes have smooth cortical thickness. 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 er/pr is pos at 90%. this had Ki67 of 10%. she is here today with her husband Bart to discuss options. she also was reported by her dentist to have bleeding during a procedure and he recommended eval. she didnt have this during her recent hysterectomy by Dr Helane Rima. she has seen Dr Lindi Adie of med onc who will await path and is evaluating for vWD. Marland Kitchen She also had MRI that only really shows seroma and clip without any other abnormalities. nothing else has changed.   Other Problems Illene Regulus, CMA; 03/29/2015 4:48 PM) Breast Cancer High blood pressure Thyroid Disease  Past Surgical History Illene Regulus, CMA; 03/29/2015 4:48 PM) Appendectomy Breast Biopsy Right. Cesarean Section - Multiple Foot Surgery Right. Hysterectomy (not due to cancer) - Partial Oral Surgery Tonsillectomy  Diagnostic Studies History Illene Regulus, CMA; 03/29/2015 4:48 PM) Colonoscopy never Mammogram within last year Pap Smear 1-5 years ago  Allergies Illene Regulus, CMA; 03/29/2015 4:49 PM) Cipro *FLUOROQUINOLONES* Sulfabenzamide *CHEMICALS*  Medication History (Alisha Spillers, CMA; 03/29/2015 4:50 PM) Hydrochlorothiazide (25MG Tablet, Oral) Active. Levothyroxine Sodium (50MCG Tablet, Oral) Active. MetFORMIN HCl ER (500MG Tablet ER 24HR, Oral) Active. Medications Reconciled  Social History Illene Regulus, CMA; 03/29/2015 4:48 PM) Alcohol use Occasional alcohol use. Caffeine use Coffee, Tea. No drug use Tobacco use Never  smoker.  Family History Illene Regulus, [place]; 03/29/2015 4:48 PM) Arthritis Mother. Breast Cancer Family Members In General. Colon Cancer Family Members In General. Depression Father. Hypertension Father, Mother.  Pregnancy / Birth History Illene Regulus, CMA; 03/29/2015 4:48 PM) Age at menarche 18 years. Contraceptive History Oral contraceptives. Gravida 2 Maternal age 34-30 Para 2  Review of Systems Illene Regulus CMA; 03/29/2015 4:48 PM) General Not Present- Appetite Loss, Chills, Fatigue, Fever, Night Sweats, Weight Gain and Weight Loss. Skin Not Present- Change in Wart/Mole, Dryness, Hives, Jaundice, New Lesions, Non-Healing Wounds, Rash and Ulcer. HEENT Present- Seasonal Allergies and Wears glasses/contact lenses. Not Present- Earache, Hearing Loss, Hoarseness, Nose Bleed, Oral Ulcers, Ringing in the Ears, Sinus Pain, Sore Throat, Visual Disturbances and Yellow Eyes. Respiratory Present- Snoring. Not Present- Bloody sputum, Chronic Cough, Difficulty Breathing and Wheezing. Breast Present- Breast Mass. Not Present- Breast Pain, Nipple Discharge and Skin Changes. Cardiovascular Not Present- Chest Pain, Difficulty Breathing Lying Down, Leg Cramps, Palpitations, Rapid Heart Rate, Shortness of Breath and Swelling of Extremities. Gastrointestinal Not Present- Abdominal Pain, Bloating, Bloody Stool, Change in Bowel Habits, Chronic diarrhea, Constipation, Difficulty Swallowing, Excessive gas, Gets full quickly at meals, Hemorrhoids, Indigestion, Nausea, Rectal Pain and Vomiting. Female Genitourinary Not Present- Frequency, Nocturia, Painful Urination, Pelvic Pain and Urgency. Musculoskeletal Not Present- Back Pain, Joint Pain, Joint Stiffness, Muscle Pain, Muscle Weakness and Swelling of Extremities. Neurological Not Present- Decreased Memory, Fainting, Headaches, Numbness, Seizures, Tingling, Tremor, Trouble walking and Weakness. Psychiatric Not Present- Anxiety, Bipolar, Change  in Sleep Pattern, Depression, Fearful and Frequent crying. Endocrine Not Present- Cold Intolerance, Excessive Hunger, Hair Changes, Heat Intolerance, Hot flashes and New Diabetes. Hematology Present- Excessive bleeding. Not Present- Easy Bruising, Gland problems, HIV and Persistent Infections.   Vitals Lars Mage Spillers CMA; 03/29/2015 4:49 PM) 03/29/2015 4:48 PM  Weight: 162 lb Height: 66in Body Surface Area: 1.85 m Body Mass Index: 26.15 kg/m Pulse: 90 (Regular)  BP: 124/76 (Sitting, Left Arm, Standard)    Physical Exam [name] MD; 03/29/2015 8:17 PM) General Mental Status-Alert. Orientation-Oriented X3.  Chest and Lung Exam Chest and lung exam reveals -on auscultation, normal breath sounds, no adventitious sounds and normal vocal resonance.  Breast Nipples-No Discharge. Breast Lump-No Palpable Breast Mass.   Cardiovascular Cardiovascular examination reveals -normal heart sounds, regular rate and rhythm with no murmurs.  Lymphatic Head & Neck  General Head & Neck Lymphatics: Bilateral - Description - Normal. Axillary  General Axillary Region: Bilateral - Description - Normal. Note: no Pioneer adenopathy     Assessment & Plan [name] MD; 03/29/2015 8:27 PM) CARCINOMA OF BREAST UPPER OUTER QUADRANT, RIGHT (174.4  C50.411) Right breast seed guided lumpectomy, right axillary sn biopsy We discussed a sentinel lymph node biopsy as she does not appear to having lymph node involvement right now. We discussed the performance of that with injection of radioactive tracer. We discussed that she would have an incision underneath her axillary hairline. We discussed that there is a chance of having a positive node with a sentinel lymph node biopsy and we will await the permanent pathology to make any other first further decisions in terms of her treatment. One of these options might be to return to the operating room to perform an axillary lymph  node dissection. We discussed up to a 5% risk lifetime of chronic shoulder pain as well as lymphedema associated with a sentinel lymph node biopsy. We discussed the options for treatment of the breast cancer which included lumpectomy versus a mastectomy. We discussed the performance of the lumpectomy. We also discussed that she will need radiation therapy (this is usually 5-7 weeks) if she undergoes lumpectomy. We discussed the mastectomy (removal of whole breast) and the postoperative care for that as well. Mastectomy can be followed by reconstruction. This is a more extensive surgery and requires more recovery. The decision for lumpectomy vs mastectomy has no impact on decision for chemotherapy. Most mastectomy patients will not need radiation therapy. We discussed that there is no difference in her survival whether she undergoes lumpectomy with radiation therapy or antiestrogen therapy versus a mastectomy. There is also no real difference between her recurrence in the breast.

## 2015-04-28 NOTE — Anesthesia Preprocedure Evaluation (Addendum)
Anesthesia Evaluation  Patient identified by MRN, date of birth, ID band Patient awake    Reviewed: Allergy & Precautions, H&P , NPO status , Patient's Chart, lab work & pertinent test results  History of Anesthesia Complications Negative for: history of anesthetic complications  Airway Mallampati: II  TM Distance: >3 FB Neck ROM: Full    Dental no notable dental hx. (+) Caps,    Pulmonary neg pulmonary ROS,  breath sounds clear to auscultation  Pulmonary exam normal       Cardiovascular Exercise Tolerance: Good hypertension, Pt. on medications Normal cardiovascular examRhythm:Regular Rate:Normal     Neuro/Psych negative neurological ROS  negative psych ROS   GI/Hepatic negative GI ROS, Neg liver ROS,   Endo/Other  neg diabetesHypothyroidism Takes metformin for PCOS  Renal/GU negative Renal ROS  negative genitourinary   Musculoskeletal negative musculoskeletal ROS (+)   Abdominal   Peds negative pediatric ROS (+)  Hematology negative hematology ROS (+)   Anesthesia Other Findings   Reproductive/Obstetrics negative OB ROS                            Anesthesia Physical  Anesthesia Plan  ASA: II  Anesthesia Plan: General   Post-op Pain Management:    Induction: Intravenous  Airway Management Planned: LMA  Additional Equipment:   Intra-op Plan:   Post-operative Plan: Extubation in OR  Informed Consent: I have reviewed the patients History and Physical, chart, labs and discussed the procedure including the risks, benefits and alternatives for the proposed anesthesia with the patient or authorized representative who has indicated his/her understanding and acceptance.   Dental advisory given  Plan Discussed with: CRNA  Anesthesia Plan Comments:         Anesthesia Quick Evaluation

## 2015-04-28 NOTE — Discharge Instructions (Signed)
[facility] °Office Phone Number [phone] ° °POST OP INSTRUCTIONS ° °Always review your discharge instruction sheet given to you by the facility where your surgery was performed. ° °IF YOU HAVE DISABILITY OR FAMILY LEAVE FORMS, YOU MUST BRING THEM TO THE OFFICE FOR PROCESSING.  DO NOT GIVE THEM TO YOUR DOCTOR. ° °1. A prescription for pain medication may be given to you upon discharge.  Take your pain medication as prescribed, if needed.  If narcotic pain medicine is not needed, then you may take acetaminophen (Tylenol), naprosyn (Alleve) or ibuprofen (Advil) as needed. °2. Take your usually prescribed medications unless otherwise directed °3. If you need a refill on your pain medication, please contact your pharmacy.  They will contact our office to request authorization.  Prescriptions will not be filled after 5pm or on week-ends. °4. You should eat very light the first 24 hours after surgery, such as soup, crackers, pudding, etc.  Resume your normal diet the day after surgery. °5. Most patients will experience some swelling and bruising in the breast.  Ice packs and a good support bra will help.  Wear the breast binder provided or a sports bra for 72 hours day and night.  After that wear a sports bra during the day until you return to the office. Swelling and bruising can take several days to resolve.  °6. It is common to experience some constipation if taking pain medication after surgery.  Increasing fluid intake and taking a stool softener will usually help or prevent this problem from occurring.  A mild laxative (Milk of Magnesia or Miralax) should be taken according to package directions if there are no bowel movements after 48 hours. °7. Unless discharge instructions indicate otherwise, you may remove your bandages 48 hours after surgery and you may shower at that time.  You may have steri-strips (small skin tapes) in place directly over the incision.  These strips should be left on the  skin for 7-10 days and will come off on their own.  If your surgeon used skin glue on the incision, you may shower in 24 hours.  The glue will flake off over the next 2-3 weeks.  Any sutures or staples will be removed at the office during your follow-up visit. °8. ACTIVITIES:  You may resume regular daily activities (gradually increasing) beginning the next day.  Wearing a good support bra or sports bra minimizes pain and swelling.  You may have sexual intercourse when it is comfortable. °a. You may drive when you no longer are taking prescription pain medication, you can comfortably wear a seatbelt, and you can safely maneuver your car and apply brakes. °b. RETURN TO WORK:  ______________________________________________________________________________________ °9. You should see your doctor in the office for a follow-up appointment approximately two weeks after your surgery.  Your doctor’s nurse will typically make your follow-up appointment when she calls you with your pathology report.  Expect your pathology report 3-4 business days after your surgery.  You may call to check if you do not hear from us after three days. °10. OTHER INSTRUCTIONS: _______________________________________________________________________________________________ _____________________________________________________________________________________________________________________________________ °_____________________________________________________________________________________________________________________________________ °_____________________________________________________________________________________________________________________________________ ° °WHEN TO CALL DR [name]: °1. Fever over 101.0 °2. Nausea and/or vomiting. °3. Extreme swelling or bruising. °4. Continued bleeding from incision. °5. Increased pain, redness, or drainage from the incision. ° °The clinic staff is available to answer your questions during regular  business hours.  Please don’t hesitate to call and ask to speak to one of the nurses for clinical concerns.  If   you have a medical emergency, go to the nearest emergency room or call 911.  A surgeon from [facility] is always on call at the hospital.  For further questions, please visit [email or web address]    Post Anesthesia Home Care Instructions  Activity: Get plenty of rest for the remainder of the day. A responsible adult should stay with you for 24 hours following the procedure.  For the next 24 hours, DO NOT: -Drive a car -Paediatric nurse -Drink alcoholic beverages -Take any medication unless instructed by your physician -Make any legal decisions or sign important papers.  Meals: Start with liquid foods such as gelatin or soup. Progress to regular foods as tolerated. Avoid greasy, spicy, heavy foods. If nausea and/or vomiting occur, drink only clear liquids until the nausea and/or vomiting subsides. Call your physician if vomiting continues.  Special Instructions/Symptoms: Your throat may feel dry or sore from the anesthesia or the breathing tube placed in your throat during surgery. If this causes discomfort, gargle with warm salt water. The discomfort should disappear within 24 hours.  If you had a scopolamine patch placed behind your ear for the management of post- operative nausea and/or vomiting:  1. The medication in the patch is effective for 72 hours, after which it should be removed.  Wrap patch in a tissue and discard in the trash. Wash hands thoroughly with soap and water. 2. You may remove the patch earlier than 72 hours if you experience unpleasant side effects which may include dry mouth, dizziness or visual disturbances. 3. Avoid touching the patch. Wash your hands with soap and water after contact with the patch.   Call your surgeon if you experience:   1.  Fever over 101.0. 2.  Inability to urinate. 3.  Nausea and/or vomiting. 4.   Extreme swelling or bruising at the surgical site. 5.  Continued bleeding from the incision. 6.  Increased pain, redness or drainage from the incision. 7.  Problems related to your pain medication. 8. Any change in color, movement and/or sensation 9. Any problems and/or concerns

## 2015-04-28 NOTE — Anesthesia Postprocedure Evaluation (Signed)
Anesthesia Post Note  Patient: [name]  Procedure(s) Performed: Procedure(s) (LRB): RADIOACTIVE SEED GUIDED RIGHT BREAST PARTIAL MASTECTOMY WITH RIGHT AXILLARY SENTINEL LYMPH NODE BIOPSY (Right)  Anesthesia type: general  Patient location: PACU  Post pain: Pain level controlled  Post assessment: Patient's Cardiovascular Status Stable  Last Vitals:  Filed Vitals:   04/28/15 1045  BP: 129/90  Pulse: 71  Temp: 36.6 C  Resp: 16    Post vital signs: Reviewed and stable  Level of consciousness: sedated  Complications: No apparent anesthesia complications

## 2015-04-28 NOTE — Anesthesia Procedure Notes (Addendum)
Anesthesia Regional Block:  Pectoralis block  Pre-Anesthetic Checklist: ,, timeout performed, Correct Patient, Correct Site, Correct Laterality, Correct Procedure, Correct Position, site marked, Risks and benefits discussed,  Surgical consent,  Pre-op evaluation,  At surgeon's request and post-op pain management  Laterality: Right  Prep: chloraprep       Needles:  Injection technique: Single-shot  Needle Type: Echogenic Stimulator Needle     Needle Length: 10cm 10 cm Needle Gauge: 21 and 21 G    Additional Needles:  Procedures: ultrasound guided (picture in chart) Pectoralis block Narrative:  Start time: 04/28/2015 7:11 AM End time: 04/28/2015 7:21 AM Injection made incrementally with aspirations every 5 mL.  Performed by: Personally    Procedure Name: LMA Insertion Date/Time: 04/28/2015 7:44 AM Performed by: Melynda Ripple D Pre-anesthesia Checklist: Patient identified, Emergency Drugs available, Suction available and Patient being monitored Patient Re-evaluated:Patient Re-evaluated prior to inductionOxygen Delivery Method: Circle System Utilized Preoxygenation: Pre-oxygenation with 100% oxygen Intubation Type: IV induction Ventilation: Mask ventilation without difficulty LMA: LMA inserted LMA Size: 4.0 Number of attempts: 1 Airway Equipment and Method: Bite block Placement Confirmation: positive ETCO2 Tube secured with: Tape Dental Injury: Teeth and Oropharynx as per pre-operative assessment

## 2015-04-28 NOTE — Transfer of Care (Signed)
Immediate Anesthesia Transfer of Care Note  Patient: [name]  Procedure(s) Performed: Procedure(s): RADIOACTIVE SEED GUIDED RIGHT BREAST PARTIAL MASTECTOMY WITH RIGHT AXILLARY SENTINEL LYMPH NODE BIOPSY (Right)  Patient Location: PACU  Anesthesia Type:General  Level of Consciousness: awake, alert  and oriented  Airway & Oxygen Therapy: Patient Spontanous Breathing and Patient connected to face mask oxygen  Post-op Assessment: Report given to RN and Post -op Vital signs reviewed and stable  Post vital signs: Reviewed and stable  Last Vitals:  Filed Vitals:   04/28/15 0915  BP:   Pulse: 99  Temp:   Resp: 15    Complications: No apparent anesthesia complications

## 2015-04-28 NOTE — Progress Notes (Signed)
Assisted Dr. Singer with right, ultrasound guided, pectoralis block. Side rails up, monitors on throughout procedure. See vital signs in flow sheet. Tolerated Procedure well. °

## 2015-04-28 NOTE — Op Note (Signed)
Preoperative diagnosis: stage I right breast cancer Postoperative diagnosis: same as above Procedure: 1. Right breast seed guided lumpectomy 2. Right axillary sn biopsy Surgeon Dr Serita Grammes Anes general with pectoral block EBL: minimal Comps none Specimen:  1. Right breast marked with paint 2. Sentinel node x3 with extra tissue  (counts range from [phone]) 3. Additional posterior and lateral margins Sponge count correct at completion dispo to recovery stable  Indications: this is a 14 yof with newly diagnosed stage I right breast cancer. We discussed proceeding with lumpectomy/sn biopsy. She had radioactive seed placed prior to beginning. I had these mm in the OR.   Procedure: After informed consent was obtained the patient was taken to the OR. She was injected with technetium in the standard periareolar fashion. She underwent a pectoral block. She was given ancef. SCDs were in place. She was prepped and draped in the standard sterile surgical fashion. A timeout was performed. I infiltrated marcaine in the skin.I made a periareolar incision and used the neoprobe to guide my way to the superior tumor. There was still a hematoma which I entered during the dissection. The clip immediately came out and the seed later came out of this cavity as they both just floated out.   I did confirm removal of both the seed and the clip with mammography.  I then had pathology look at the specimen and the entire old biopsy cavity was removed but abutted the posterior and lateral margins. I excised more at both of these margins.  These were marked short superior, long lateral and double deep.  I then obtained hemostasis. Clips were placed in cavity. I then closed with 2-0 vicryl, 3-0 vicryl and 5-0 monocryl. Glue and steristrips were applied.  I then located the sentinel nodes. I made an incision below the axillary hairline. I went through the axillary fascia. I was able to locate what appear to be 3  sentinel nodes with counts listed above. There was some nonsentinel node tissue that was sent also.  There were no more palpable or radioactive nodes present. I obtained hemostasis. I then closed the fascia with 2-0 vicryl The skin was closed with 3-0 vicryl and 4-0 monocryl. Glue and steristrips were applied. A breast binder was placed. She was extubated and transferred to recovery stable

## 2015-04-29 ENCOUNTER — Encounter (HOSPITAL_BASED_OUTPATIENT_CLINIC_OR_DEPARTMENT_OTHER): Payer: Self-pay | Admitting: General Surgery

## 2015-04-29 ENCOUNTER — Telehealth: Payer: Self-pay | Admitting: *Deleted

## 2015-04-29 NOTE — Telephone Encounter (Signed)
Received oncotype order from Dr. Lindi Adie. Requisition sent to pathology. Received by Alyse Low.

## 2015-05-04 ENCOUNTER — Ambulatory Visit (HOSPITAL_BASED_OUTPATIENT_CLINIC_OR_DEPARTMENT_OTHER): Payer: Commercial Managed Care - PPO | Admitting: Hematology and Oncology

## 2015-05-04 ENCOUNTER — Encounter: Payer: Self-pay | Admitting: *Deleted

## 2015-05-04 ENCOUNTER — Encounter: Payer: Self-pay | Admitting: Hematology and Oncology

## 2015-05-04 VITALS — BP 132/86 | HR 103 | Temp 98.1°F | Resp 18 | Ht 66.0 in | Wt 164.1 lb

## 2015-05-04 DIAGNOSIS — C50411 Malignant neoplasm of upper-outer quadrant of right female breast: Secondary | ICD-10-CM | POA: Diagnosis not present

## 2015-05-04 NOTE — Progress Notes (Signed)
Oncology Nurse Navigator Documentation  Oncology Nurse Navigator Flowsheets 05/04/2015  Navigator Encounter Type Other  Patient Visit Type Medonc  Treatment Phase Other  Barriers/Navigation Needs No barriers at this time  Time Spent with Patient 14   Spoke with patient at her post op visit with Dr. Lindi Adie.  She is doing well with some discomfort from her surgery.   Encouraged her to call with any needs or concerns.

## 2015-05-04 NOTE — Assessment & Plan Note (Addendum)
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 I discussed the staging as well as the significance of ER/PR and HER-2 receptors.  Recommendation: 1. Oncotype DX testing to determine if chemotherapy would be of any benefit 2. Followed by radiation therapy 3. Followed by antiestrogen therapy with tamoxifen  Oncotype Dx counseling: I discussed Oncotype DX test. I explained to the patient that this is a 21 gene panel to evaluate patient tumors DNA to calculate recurrence score. This would help determine whether patient has high risk or intermediate risk or low risk breast cancer. She understands that if her tumor was found to be high risk, she would benefit from systemic chemotherapy. If low risk, no need of chemotherapy. If she was found to be intermediate risk, we would need to evaluate the score as well as other risk factors and determine if an abbreviated chemotherapy may be of benefit.  Return to clinic based Oncotype DX test result in 2 weeks

## 2015-05-04 NOTE — Progress Notes (Signed)
Patient Care Team: Carol Ada, MD as PCP - General (Family Medicine)  DIAGNOSIS: No matching staging information was found for the patient.  SUMMARY OF ONCOLOGIC HISTORY:   Breast cancer of upper-outer quadrant of right female breast   03/16/2015 Mammogram  mammogram and ultrasound : right breast 12:00 position 1.4 x 1.3 x 1.2 cm hypoechoic irregular mass , right axillary lymph node with smooth cortical thickness 3 mm   03/22/2015 Initial Diagnosis Right breast biopsy: Invasive ductal carcinoma with calcifications , ER 98%, PR 98%, Ki-67 6%, HER-2 negative ratio 1.35   04/28/2015 Surgery Right lumpectomy: Invasive ductal carcinoma grade 1, 0.9 cm, low-grade DCIS, margins negative, 0/6 sentinel nodes negative, ER 98%, PR 98%, HER-2 negative, Ki-67 6%, T1 BN 0 stage IA    CHIEF COMPLIANT: Follow-up after right lumpectomy  INTERVAL HISTORY: [name] is a 67 year with above-mentioned history of right breast cancer treated with lumpectomy and is here today to discuss the pathology report. The final specimen revealed a tumor that was 9 mm in size low-grade 6 lymph nodes were negative. ER/PR positive HER-2 negative. She is still recovering from the surgery with discomfort underneath the arm. Prior to breast surgery she had a skin lesion taken out on her thigh which is still bruised.  REVIEW OF SYSTEMS:   Constitutional: Denies fevers, chills or abnormal weight loss Eyes: Denies blurriness of vision Ears, nose, mouth, throat, and face: Denies mucositis or sore throat Respiratory: Denies cough, dyspnea or wheezes Cardiovascular: Denies palpitation, chest discomfort or lower extremity swelling Gastrointestinal:  Denies nausea, heartburn or change in bowel habits Skin: Denies abnormal skin rashes Lymphatics: Denies new lymphadenopathy or easy bruising Neurological:Denies numbness, tingling or new weaknesses Behavioral/Psych: Mood is stable, no new changes  Breast:  Pain and discomfort from  recent surgery All other systems were reviewed with the patient and are negative.  I have reviewed the past medical history, past surgical history, social history and family history with the patient and they are unchanged from previous note.  ALLERGIES:  is allergic to ciprofloxacin and sulfa antibiotics.  MEDICATIONS:  Current Outpatient Prescriptions  Medication Sig Dispense Refill  . FeFum-FePoly-FA-B Cmp-C-Biot (INTEGRA PLUS PO) Take by mouth daily.    . hydrochlorothiazide (HYDRODIURIL) 25 MG tablet Take 25 mg by mouth every morning.    Marland Kitchen levothyroxine (SYNTHROID, LEVOTHROID) 50 MCG tablet Take 50 mcg by mouth daily before breakfast.    . metFORMIN (GLUCOPHAGE-XR) 500 MG 24 hr tablet Take 500 mg by mouth daily with breakfast.    . oxyCODONE-acetaminophen (PERCOCET) 10-325 MG per tablet Take 1 tablet by mouth every 6 (six) hours as needed for pain. 20 tablet 0   No current facility-administered medications for this visit.    PHYSICAL EXAMINATION: ECOG PERFORMANCE STATUS: 1 - Symptomatic but completely ambulatory  Filed Vitals:   05/04/15 1412  BP: 132/86  Pulse: 103  Temp: 98.1 F (36.7 C)  Resp: 18   Filed Weights   05/04/15 1412  Weight: 164 lb 1.6 oz (74.435 kg)    GENERAL:alert, no distress and comfortable SKIN: skin color, texture, turgor are normal, no rashes or significant lesions EYES: normal, Conjunctiva are pink and non-injected, sclera clear OROPHARYNX:no exudate, no erythema and lips, buccal mucosa, and tongue normal  NECK: supple, thyroid normal size, non-tender, without nodularity LYMPH:  no palpable lymphadenopathy in the cervical, axillary or inguinal LUNGS: clear to auscultation and percussion with normal breathing effort HEART: regular rate & rhythm and no murmurs and no lower  extremity edema ABDOMEN:abdomen soft, non-tender and normal bowel sounds Musculoskeletal:no cyanosis of digits and no clubbing  NEURO: alert & oriented x 3 with fluent speech,  no focal motor/sensory deficits  LABORATORY DATA:  I have reviewed the data as listed   Chemistry      Component Value Date/Time   NA 138 04/26/2015 1405   K 3.5 04/26/2015 1405   CL 101 04/26/2015 1405   CO2 29 04/26/2015 1405   BUN 8 04/26/2015 1405   CREATININE 0.67 04/26/2015 1405      Component Value Date/Time   CALCIUM 9.6 04/26/2015 1405   ALKPHOS 67 04/26/2015 1405   AST 24 04/26/2015 1405   ALT 29 04/26/2015 1405   BILITOT 0.9 04/26/2015 1405       Lab Results  Component Value Date   WBC 8.0 04/26/2015   HGB 16.6* 04/26/2015   HCT 45.6 04/26/2015   MCV 87.9 04/26/2015   PLT 242 04/26/2015   NEUTROABS 5.3 04/26/2015     ASSESSMENT & PLAN:  Breast cancer of upper-outer quadrant of right female breast Right lumpectomy 04/28/2015: Invasive ductal carcinoma grade 1, 0.9 cm, low-grade DCIS, margins negative, 0/6 sentinel nodes negative, ER 98%, PR 98%, HER-2 negative, Ki-67 6%, T1 BN 0 stage IA  Pathology review: I discussed the final pathology report the patient provided with a copy of this report. I discussed the staging as well as the significance of ER/PR and HER-2 receptors.  Recommendation: 1. Oncotype DX testing to determine if chemotherapy would be of any benefit 2. Followed by radiation therapy 3. Followed by antiestrogen therapy with tamoxifen  Oncotype Dx counseling: I discussed Oncotype DX test. I explained to the patient that this is a 21 gene panel to evaluate patient tumors DNA to calculate recurrence score. This would help determine whether patient has high risk or intermediate risk or low risk breast cancer. She understands that if her tumor was found to be high risk, she would benefit from systemic chemotherapy. If low risk, no need of chemotherapy. If she was found to be intermediate risk, we would need to evaluate the score as well as other risk factors and determine if an abbreviated chemotherapy may be of benefit.  Upper abdominal wall mole: We  just changing color and she will see dermatology to get it out.  Return to clinic based Oncotype DX test result in 2 weeks. If she does not need chemotherapy will make follow up with radiation oncology.    No orders of the defined types were placed in this encounter.   The patient has a good understanding of the overall plan. she agrees with it. she will call with any problems that may develop before the next visit here.   Rulon Eisenmenger, MD

## 2015-05-19 ENCOUNTER — Ambulatory Visit: Payer: Commercial Managed Care - PPO | Attending: General Surgery | Admitting: Physical Therapy

## 2015-05-19 DIAGNOSIS — Z9189 Other specified personal risk factors, not elsewhere classified: Secondary | ICD-10-CM | POA: Diagnosis present

## 2015-05-19 DIAGNOSIS — M25611 Stiffness of right shoulder, not elsewhere classified: Secondary | ICD-10-CM | POA: Diagnosis present

## 2015-05-19 NOTE — Patient Instructions (Addendum)
[email or web address] Courses Online Strength ABC Right of page lymphedema education session   Cane Overhead - Supine  Hold cane at thighs with both hands, extend arms straight over head. Hold _1-2__ seconds. Repeat _5__ times. Do __3_ times per day.

## 2015-05-19 NOTE — Therapy (Signed)
[place], [place], [address] Phone: [phone]   Fax:  [phone]  Physical Therapy Evaluation  Patient Details  Name: [name] MRN: [id number] Date of Birth: [birth year] Referring Provider:  [name], MD  Encounter Date: 05/19/2015      PT End of Session - 05/19/15 1159    Visit Number 1   Number of Visits 9   Date for PT Re-Evaluation 06/26/15   PT Start Time 1105   PT Stop Time 1155   PT Time Calculation (min) 50 min   Activity Tolerance Patient tolerated treatment well   Behavior During Therapy [facility] for tasks assessed/performed      Past Medical History  Diagnosis Date  . Menorrhagia   . PCOS (polycystic ovarian syndrome)   . Hypertension   . Hypothyroidism   . Invasive ductal carcinoma of right breast in female 03/22/2015    er+/PR+/her2-  . Family history of breast cancer     Past Surgical History  Procedure Laterality Date  . Cesarean section  10-09-2000  &  1999  . Appendectomy  02-21-2009  . Tonsillectomy  age 55  . Achilles tendon repair Right 2002  . Hysteroscopy with novasure  11/ 2014  . Laparoscopic assisted vaginal hysterectomy N/A 07/27/2014    Procedure: LAPAROSCOPIC ASSISTED VAGINAL HYSTERECTOMY;  Surgeon: [name], MD;  Location: [facility];  Service: Gynecology;  Laterality: N/A;  . Bilateral salpingectomy Bilateral 07/27/2014    Procedure: BILATERAL SALPINGECTOMY;  Surgeon: [name], MD;  Location: [facility];  Service: Gynecology;  Laterality: Bilateral;  . Breast, right needle core biospy Right 03/22/15  . Radioactive seed guided mastectomy with axillary sentinel lymph node biopsy Right 04/28/2015    Procedure: RADIOACTIVE SEED GUIDED RIGHT BREAST PARTIAL MASTECTOMY WITH RIGHT AXILLARY SENTINEL LYMPH NODE BIOPSY;  Surgeon: [name], MD;  Location: [place];  Service: General;   Laterality: Right;    There were no vitals filed for this visit.  Visit Diagnosis:  At risk for lymphedema  Stiffness of joint, shoulder region, right      Subjective Assessment - 05/19/15 1116    Subjective I was able to dry my hair.. that was my goal    Pertinent History lumpectomy with  6 nodes removed on june 30.  plan to have radation mid to late august . does not know if she will have to have chemo    Patient Stated Goals to keep hand over her head long enough to have radiation    Currently in Pain? Yes   Pain Score 2    Pain Location Axilla   Pain Orientation Right   Pain Descriptors / Indicators Discomfort   Pain Type Surgical pain   Aggravating Factors  gets worse as day goes on            [facility] PT Assessment - 05/19/15 0001    Assessment   Medical Diagnosis breast cancer   Hand Dominance Left   Precautions   Precautions Other (comment)   Precaution Comments cancer    Restrictions   Weight Bearing Restrictions No   Balance Screen   Has the patient fallen in the past 6 months No   Has the patient had a decrease in activity level because of a fear of falling?  No   Is the patient reluctant to leave their home because of a fear of falling?  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    Leisure wants to return to walking   Cognition   Overall Cognitive Status Within Functional Limits for tasks assessed   Observation/Other Assessments   Observations well approximated, healing incisions in axilla and right breast    Sensation   Light Touch Appears Intact   Coordination   Gross Motor Movements are Fluid and Coordinated Yes   Posture/Postural Control    Posture/Postural Control Postural limitations   Postural Limitations Rounded Shoulders;Forward head   AROM   Right Shoulder Flexion 124 Degrees   Right Shoulder ABduction 108 Degrees   Right Shoulder Internal Rotation 80 Degrees   Right Shoulder External Rotation 88 Degrees   Left Shoulder Flexion 164 Degrees   Left Shoulder ABduction 155 Degrees   Strength   Overall Strength Comments right shoulder 3-/5 pt report that her arm just gets very fatigued    Palpation   Palpation comment no firmness noted around incision inaxilla, no evidence of axillary cording            LYMPHEDEMA/ONCOLOGY QUESTIONNAIRE - 05/19/15 1134    Right Upper Extremity Lymphedema   15 cm Proximal to Olecranon Process 30.5 cm   Olecranon Process 25.5 cm   10 cm Proximal to Ulnar Styloid Process 22.8 cm   Just Proximal to Ulnar Styloid Process 15.8 cm   Across Hand at [employer] 20.2 cm   At [place] of 2nd Digit 6.2 cm   Left Upper Extremity Lymphedema   15 cm Proximal to Olecranon Process 30.5 cm   Olecranon Process 26 cm   10 cm Proximal to Ulnar Styloid Process 23 cm   Just Proximal to Ulnar Styloid Process 16 cm   Across Hand at [employer] 20 cm   At [place] of 2nd Digit 6.1 cm           Quick Dash - 05/19/15 0001    Open a tight or new jar Mild difficulty   Do heavy household chores (wash walls, wash floors) Unable   Carry a shopping bag or briefcase Unable   Wash your back Moderate difficulty   Use a knife to cut food No difficulty   Recreational activities in which you take some force or impact through your arm, shoulder, or hand (golf, hammering, tennis) Unable   During the past week, to what extent has your arm, shoulder or hand problem interfered with your normal social activities with family, friends, neighbors, or groups? Modererately   During the past week, to what extent has your arm, shoulder or hand problem limited your work or other regular daily activities Modererately   Arm,  shoulder, or hand pain. Moderate   Tingling (pins and needles) in your arm, shoulder, or hand Mild   Difficulty Sleeping Moderate difficulty   DASH Score 54.55 %             [facility] Adult PT Treatment/Exercise - 05/19/15 0001    Self-Care   Self-Care Other Self-Care Comments   Other Self-Care Comments  began insturctionfor lymphedema risk reduction    Shoulder Exercises: Supine   Other Supine Exercises dowel rod flexion    Other Supine Exercises radiation position stretch with  pillow supporting elbow                PT Education - 05/19/15 1230    Education provided Yes   Education Details weblink for klosetraining lymphedema education session, supine cane exercise    Person(s) Educated Patient   Methods Explanation;Demonstration   Comprehension Verbalized understanding                [facility] Goals - 05/19/15 1221    CC Long Term Goal  #1   Title Patient with verbalize an understanding of lymphedema risk reduction precautions   Time 4   Period Weeks   Status New   CC Long Term Goal  #2   Title Patient will be independent in a  home exercise program   Time 4   Period Weeks   Status New   CC Long Term Goal  #3   Title Patient will be know how to obtain and use compression garments for maintenance phase of treatment   Time 4   Period Weeks   Status New   CC Long Term Goal  #4   Title Patient will improve right  shoulder abduction to 120 degrees so that she can achieve position needed for radiation therapy.   Time 4   Period Weeks   Status New   CC Long Term Goal  #5   Title Patient will decrease the DASH score to < 20   to demonstrate increased functional use of upper extremity   Baseline 54.55   Time 4   Period Weeks   Status New            Plan - 05/19/15 1200    Clinical Impression Statement 3 weeks post lumpectomy and 6 node dissection who is preparing for radiation therapy. She is at risk for lymphedema    Pt will benefit from  skilled therapeutic intervention in order to improve on the following deficits Decreased range of motion;Pain;Impaired UE functional use;Decreased knowledge of use of DME;Decreased strength;Postural dysfunction   Rehab Potential Excellent   Clinical Impairments Affecting Rehab Potential recent surgery    PT Frequency 2x / week   PT Duration 4 weeks   PT Treatment/Interventions ADLs/Self Care Home Management;DME Instruction;Therapeutic activities;Therapeutic exercise;Patient/family education;Manual techniques;Passive range of motion   PT Next Visit Plan make sure pt is signed up for ABC class progress shoulder exercises to pulleys and wall stretches (Please  re-issue instruction sheet from 05-19-2015 as i did not give it to patient to take home)   PT Home Exercise Plan strength ABC program eventually    Recommended Other Services ABC class, Pilates session closer to discharge    Consulted and Agree with Plan of Care Patient         Problem List Patient Active Problem List   Diagnosis Date Noted  . Genetic testing 04/27/2015  . Breast cancer   . Family history of breast cancer   . Breast cancer of upper-outer quadrant of right female breast 03/29/2015  . S/P laparoscopic assisted vaginal hysterectomy (LAVH) 07/27/2014   Donato Heinz. Owens Shark, PT   05/19/2015, 12:32 PM  [place], [place], [address] Phone: [phone]   Fax:  [phone]

## 2015-05-24 ENCOUNTER — Encounter (HOSPITAL_COMMUNITY): Payer: Self-pay

## 2015-05-24 ENCOUNTER — Telehealth: Payer: Self-pay | Admitting: *Deleted

## 2015-05-24 NOTE — Telephone Encounter (Signed)
Spoke with patient regarding her oncotype results.  Her score is 10/7%.  She will not need chemo.  Informed her I would let Dr. [name] and the team know and get her scheduled back for radiation.  Dr. Lindi Adie will see her towards the end of radiation.  Encouraged her to call with any needs or concerns.

## 2015-05-31 NOTE — Progress Notes (Signed)
Location of Breast Cancer:right upper outer quadrant.  04/28/15:Diagnosis 1. Breast, lumpectomy, Right - INVASIVE DUCTAL CARCINOMA, GRADE 1/3, SPANNING 0.9 CM. - DUCTAL CARCINOMA IN SITU, LOW GRADE. - THE SURGICAL RESECTION MARGINS ARE NEGATIVE FOR CARCINOMA. - SEE ONCOLOGY TABLE BELOW. 2. Lymph node, sentinel, biopsy, right axillary - THERE IS NO EVIDENCE OF CARCINOMA IN 1 OF 1 LYMPH NODE (0/1). 3. Lymph node, sentinel, biopsy, right axillary - THERE IS NO EVIDENCE OF CARCINOMA IN 1 OF 1 LYMPH NODE (0/1). 4. Lymph node, sentinel, biopsy, right axillary - THERE IS NO EVIDENCE OF CARCINOMA IN 1 OF 1 LYMPH NODE (0/1).  FINAL for ILEY, DEIGNAN ([id number]) Diagnosis(continued) 5. Lymph node, sentinel, biopsy, right axillary - THERE IS NO EVIDENCE OF CARCINOMA IN 1 OF 1 LYMPH NODE (0/1). 6. Lymph node, sentinel, biopsy, right axillary - THERE IS NO EVIDENCE OF CARCINOMA IN 1 OF 1 LYMPH NODE (0/1). 7. Lymph node, sentinel, biopsy, right axillary - THERE IS NO EVIDENCE OF CARCINOMA IN 1 OF 1 LYMPH NODE (0/1). 8. Breast, excision, right posterior margin - FIBROCYSTIC CHANGES WITH ADENOSIS AND CALCIFICATIONS. - THERE IS NO EVIDENCE OF MALIGNANCY. - SEE COMMENT. 9. Breast, excision, right lateral margin - FIBROCYSTIC CHANGES. - THERE IS NO EVIDENCE OF MALIGNANCY. - SEE COMMENT. 10. Medical device, removal, radioactive seed and clip - GROSS DIAGNOSIS ONLY: RADIOACTIVE SEED AND COIL SHAPED CLIP. Receptor Status: ER+), PR (+), Her2-neu (-)  Did patient present with symptoms (if so, please note symptoms) or was this found on screening mammography?: 04/28/15 RADIOACTIVE SEED GUIDED RIGHT BREAST PARTIAL MASTECTOMY WITH RIGHT AXILLARY SENTINEL LYMPH NODE BIOPSY (Right Breast)  Past/Anticipated interventions by surgeon, if any:04/28/2015 RADIOACTIVE SEED GUIDED PARTIAL MASTECTOMY WITH AXILLARY SENTINEL LYMPH NODE BIOPSY  Past/Anticipated interventions by medical oncology, if any: Chemotherapy is  not required. Oncotype:10/7%  Lymphedema issues, if any: No. Patient has started physical therapy and will go for second appointment today.  Pain issues, if any: Has some tenderness of right breast, axilla and shoulder.  SAFETY ISSUES:  Prior radiation? No  Pacemaker/ICD? No  Possible current pregnancy?No  Is the patient on methotrexate?No  Current Complaints / other details: follow up new consult post surgery.    Arlyss Repress, RN 05/31/2015,10:48 AM   Allergies:cipro and sulfa BP 132/96 mmHg  Pulse 73  Temp(Src) 98.2 F (36.8 C)  Wt 166 lb 1.6 oz (75.342 kg)  LMP 07/14/2014

## 2015-06-02 ENCOUNTER — Ambulatory Visit
Admission: RE | Admit: 2015-06-02 | Discharge: 2015-06-02 | Disposition: A | Discharge status: Home / Self Care | Payer: Commercial Managed Care - PPO | Source: Ambulatory Visit | Attending: Radiation Oncology | Admitting: Radiation Oncology

## 2015-06-02 ENCOUNTER — Ambulatory Visit: Payer: Commercial Managed Care - PPO | Attending: General Surgery | Admitting: Physical Therapy

## 2015-06-02 ENCOUNTER — Encounter: Payer: Self-pay | Admitting: *Deleted

## 2015-06-02 VITALS — BP 132/96 | HR 73 | Temp 98.2°F | Wt 166.1 lb

## 2015-06-02 DIAGNOSIS — M25611 Stiffness of right shoulder, not elsewhere classified: Secondary | ICD-10-CM | POA: Diagnosis present

## 2015-06-02 DIAGNOSIS — Z9189 Other specified personal risk factors, not elsewhere classified: Secondary | ICD-10-CM | POA: Diagnosis present

## 2015-06-02 DIAGNOSIS — C50411 Malignant neoplasm of upper-outer quadrant of right female breast: Secondary | ICD-10-CM

## 2015-06-02 DIAGNOSIS — Z510 Encounter for antineoplastic radiation therapy: Secondary | ICD-10-CM | POA: Diagnosis not present

## 2015-06-02 NOTE — Therapy (Signed)
[place], [place], [address] Phone: [phone]   Fax:  [phone]  Physical Therapy Treatment  Patient Details  Name: [name] MRN: [id number] Date of Birth: 10/03/1971 Referring Provider:  [name], MD  Encounter Date: 06/02/2015      PT End of Session - 06/02/15 1543    Visit Number 2   Number of Visits 9   Date for PT Re-Evaluation 06/26/15   PT Start Time 1540   PT Stop Time 1534   PT Time Calculation (min) 49 min   Activity Tolerance Patient tolerated treatment well   Behavior During Therapy [facility] for tasks assessed/performed      Past Medical History  Diagnosis Date  . Menorrhagia   . PCOS (polycystic ovarian syndrome)   . Hypertension   . Hypothyroidism   . Invasive ductal carcinoma of right breast in female 03/22/2015    er+/PR+/her2-  . Family history of breast cancer     Past Surgical History  Procedure Laterality Date  . Cesarean section  10-09-2000  &  1999  . Appendectomy  02-21-2009  . Tonsillectomy  age 89  . Achilles tendon repair Right 2002  . Hysteroscopy with novasure  11/ 2014  . Laparoscopic assisted vaginal hysterectomy N/A 07/27/2014    Procedure: LAPAROSCOPIC ASSISTED VAGINAL HYSTERECTOMY;  Surgeon: [name], MD;  Location: [facility];  Service: Gynecology;  Laterality: N/A;  . Bilateral salpingectomy Bilateral 07/27/2014    Procedure: BILATERAL SALPINGECTOMY;  Surgeon: [name], MD;  [facility];  Service: Gynecology;  Laterality: Bilateral;  . Breast, right needle core biospy Right 03/22/15  . Radioactive seed guided mastectomy with axillary sentinel lymph node biopsy Right 04/28/2015    Procedure: RADIOACTIVE SEED GUIDED RIGHT BREAST PARTIAL MASTECTOMY WITH RIGHT AXILLARY SENTINEL LYMPH NODE BIOPSY;  Surgeon: [name], MD;  Location: [place];  Service: General;   Laterality: Right;    There were no vitals filed for this visit.  Visit Diagnosis:  At risk for lymphedema  Stiffness of joint, shoulder region, right      Subjective Assessment - 06/02/15 1447    Subjective when i wake up in the morning I'm really tight in front of shoulder Simulation is next Tuesday and radation will start the following week.    Pertinent History lumpectomy with  6 nodes removed on june 30.  plan to have radation mid to late august . does not know if she will have to have chemo    Patient Stated Goals to keep hand over her head long enough to have radiation    Currently in Pain? Yes   Pain Score 5    Pain Location Axilla   Pain Orientation Right   Pain Descriptors / Indicators Aching;Tightness   Pain Type Surgical pain   Aggravating Factors  worse in the morning and at the end of the day             [facility] PT Assessment - 06/02/15 0001    AROM   Right Shoulder Flexion 154 Degrees   Right Shoulder ABduction 143 Degrees                     [facility] Adult PT Treatment/Exercise - 06/02/15 0001    Self-Care   Other Self-Care Comments  issued small tg soft for symptomatic relief with instruction to keep it folded down at top and not rolled  Shoulder Exercises: Supine   Protraction AROM;10 reps  pain in anterior chest   Other Supine Exercises small circles in each direction   Shoulder Exercises: Seated   Other Seated Exercises table stretch in flexion and abduction   Shoulder Exercises: Standing   Other Standing Exercises wall wash    Other Standing Exercises modified downward dog    Shoulder Exercises: Pulleys   Flexion 2 minutes   Manual Therapy   Manual Lymphatic Drainage (MLD) brief, short neck diaphragmatic breathing, left axillary nodes, anterior interaxiallary anastamosis with instruciton in same all along. pt to try this to help with tightness at anterior shoulder                PT Education - 06/02/15 1542    Education  provided Yes   Education Details weblink for lymphatic flow series, klosetraining education session, shoulder stretching   Person(s) Educated Patient   Methods Explanation;Demonstration;Handout   Comprehension Verbalized understanding;Returned demonstration                [facility] Goals - 06/02/15 1552    CC Long Term Goal  #1   Title Patient with verbalize an understanding of lymphedema risk reduction precautions   Status On-going   CC Long Term Goal  #2   Title Patient will be independent in a  home exercise program   Status On-going   CC Long Term Goal  #3   Title Patient will be know how to obtain and use compression garments for maintenance phase of treatment   Status On-going   CC Long Term Goal  #4   Title Patient will improve right  shoulder abduction to 120 degrees so that she can achieve position needed for radiation therapy.   Status Achieved            Plan - 06/02/15 1552    Clinical Impression Statement pt has made great gains in range of motion so far with goal #4 achieved.  Needs to continue to work on end range of motion and then progress to strength ABC.  She signed up for ABC class on Aug 15    Clinical Impairments Affecting Rehab Potential recent surgery    PT Next Visit Plan  assess for anterior chest swelling. continue with shoulder range of motion and exercise.  consider strengthABC program.    Consulted and Agree with Plan of Care Patient        Problem List Patient Active Problem List   Diagnosis Date Noted  . Genetic testing 04/27/2015  . Breast cancer   . Family history of breast cancer   . Breast cancer of upper-outer quadrant of right female breast 03/29/2015  . S/P laparoscopic assisted vaginal hysterectomy (LAVH) 07/27/2014   Donato Heinz. Owens Shark, PT  06/02/2015, 3:56 PM  [place], [place], [address] Phone: [phone]   Fax:   [phone]

## 2015-06-02 NOTE — Patient Instructions (Addendum)
youtube  Lymphatic flow series with shoosh lettick crotzer    [employer]ww[employer] Courses Online Strength ABC  right of page lymphedema education session    Wall wash 5 repetition 3 times a day Wall downward dog for 2 breaths  Table stretch forward and side 1 minute each

## 2015-06-02 NOTE — Progress Notes (Signed)
   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  Interval History: [name] presents today for routine follow-up appointment with radiation oncology after her most recent surgery. The patient's lumpectomy was completed on 04/28/2015 with negative margins and 6 negative lymph nodes. The oncotype score was low so she will not require chemotherapy. Her tumor was ER positive at 98% and as a result, anti-estrogen therapy has been recommended. This will take place once radiation therapy is complete. The patient is currently participating in physical therapy to improve the range of motion to her right arm. The patient projects a healthy mental disposition and was accompanied by her husband for today's radiation oncology appointment.  Physical Exam: The patient is alert and oriented x3. Filed Vitals:   06/02/15 0940  BP: 132/96  Pulse: 73  Temp: 98.2 F (36.8 C)  Weight: 166 lb 1.6 oz (75.342 kg)    IMPRESSION: [name] is a 44 y.o. 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 The patient is recovering well from the effects of her most recent surgery. The patient's progress with her physical therapist Helene Kelp) in regards to the improvement to the range of motion to her right arm was discussed.  PLAN: Results of the patient's most recent surgery (lumpectomy completed 04/28/2015) were reviewed. The patient and her husband have been advised of the CT scan and planning session to take place next Tuesday at 2:00pm in August of 2016. Following the CT scan and planning session, 4-6 weeks of radiation treatment is to be scheduled. The traditional process in which patients  experience when undergoing radiation therapy and common symptoms to expect were fully reviewed and discussed. Healthy management methods to address these common symptoms if they are too occur was discussed. All questions and concerns vocalized were fully addressed. The patient is aware of her follow up appointment with radiation oncology to take place as scheduled. Consent paperwork was fully reviewed and signed. If the patient develops any additional questions or concerns, she has been encouraged to contact Dr. [name].   This document serves as a record of services personally performed by Thea Silversmith , MD. It was created on her behalf by Lenn Cal, a trained medical scribe. The creation of this record is based on the scribe's personal observations and the provider's statements to them. This document has been checked and approved by the attending provider.   ------------------------------------------------  Thea Silversmith, MD

## 2015-06-02 NOTE — Progress Notes (Signed)
Please see the Nurse Progress Note in the MD Initial Consult Encounter for this patient. 

## 2015-06-03 ENCOUNTER — Ambulatory Visit: Payer: Commercial Managed Care - PPO | Admitting: Physical Therapy

## 2015-06-07 ENCOUNTER — Ambulatory Visit
Admission: RE | Admit: 2015-06-07 | Discharge: 2015-06-07 | Disposition: A | Discharge status: Home / Self Care | Payer: Commercial Managed Care - PPO | Source: Ambulatory Visit | Attending: Radiation Oncology | Admitting: Radiation Oncology

## 2015-06-07 ENCOUNTER — Ambulatory Visit: Payer: Commercial Managed Care - PPO | Admitting: Physical Therapy

## 2015-06-07 DIAGNOSIS — Z9189 Other specified personal risk factors, not elsewhere classified: Secondary | ICD-10-CM | POA: Diagnosis not present

## 2015-06-07 DIAGNOSIS — M25611 Stiffness of right shoulder, not elsewhere classified: Secondary | ICD-10-CM

## 2015-06-07 DIAGNOSIS — C50411 Malignant neoplasm of upper-outer quadrant of right female breast: Secondary | ICD-10-CM

## 2015-06-07 DIAGNOSIS — Z510 Encounter for antineoplastic radiation therapy: Secondary | ICD-10-CM | POA: Diagnosis not present

## 2015-06-07 NOTE — Therapy (Signed)
[place], [place], [address] Phone: [phone]   Fax:  [phone]  Physical Therapy Treatment  Patient Details  Name: [name] MRN: [id number] Date of Birth: September 25, 1971 Referring Provider:  [name], MD  Encounter Date: 06/07/2015      PT End of Session - 06/07/15 1409    Visit Number 3   Number of Visits 9   Date for PT Re-Evaluation 06/26/15   PT Start Time 1300   PT Stop Time 1346   PT Time Calculation (min) 46 min      Past Medical History  Diagnosis Date  . Menorrhagia   . PCOS (polycystic ovarian syndrome)   . Hypertension   . Hypothyroidism   . Invasive ductal carcinoma of right breast in female 03/22/2015    er+/PR+/her2-  . Family history of breast cancer     Past Surgical History  Procedure Laterality Date  . Cesarean section  10-09-2000  &  1999  . Appendectomy  02-21-2009  . Tonsillectomy  age 77  . Achilles tendon repair Right 2002  . Hysteroscopy with novasure  11/ 2014  . Laparoscopic assisted vaginal hysterectomy N/A 07/27/2014    Procedure: LAPAROSCOPIC ASSISTED VAGINAL HYSTERECTOMY;  Surgeon: [name], MD;  Location: [facility];  Service: Gynecology;  Laterality: N/A;  . Bilateral salpingectomy Bilateral 07/27/2014    Procedure: BILATERAL SALPINGECTOMY;  Surgeon: [name], MD;  Location: [facility];  Service: Gynecology;  Laterality: Bilateral;  . Breast, right needle core biospy Right 03/22/15  . Radioactive seed guided mastectomy with axillary sentinel lymph node biopsy Right 04/28/2015    Procedure: RADIOACTIVE SEED GUIDED RIGHT BREAST PARTIAL MASTECTOMY WITH RIGHT AXILLARY SENTINEL LYMPH NODE BIOPSY;  Surgeon: [name], MD;  Location: [place];  Service: General;  Laterality: Right;    There were no vitals filed for this visit.  Visit Diagnosis:  At risk for  lymphedema  Stiffness of joint, shoulder region, right      Subjective Assessment - 06/07/15 1303    Subjective pt says she has a twingy pain especially in the evening    Pertinent History lumpectomy with  6 nodes removed on june 30.  plan to have radation mid to late august . does not know if she will have to have chemo    Patient Stated Goals to keep hand over her head long enough to have radiation    Currently in Pain? Yes   Pain Score 6    Pain Location Axilla   Pain Orientation Right   Pain Descriptors / Indicators Aching;Tightness   Pain Type Surgical pain   Pain Onset 1 to 4 weeks ago   Aggravating Factors  worst at the end the day    Effect of Pain on Daily Activities limits activites especially in the evening                          [facility] Adult PT Treatment/Exercise - 06/07/15 0001    Shoulder Exercises: Supine   Other Supine Exercises small circles in each direction in sidelying    Shoulder Exercises: Sidelying   Other Sidelying Exercises small circles in each direction   Other Sidelying Exercises arm stretch overhead    Shoulder Exercises: Standing   Flexion AROM;5 reps   Flexion Limitations yellow ball up the wall to stretch foward and up toward right and up toward left with  deep breath into stretch    Shoulder Exercises: Pulleys   Flexion 2 minutes   ABduction 2 minutes   Manual Therapy   Manual therapy comments instructed pt during treament and how to instruct her husband to help her in sidelying for back    Manual Lymphatic Drainage (MLD) brief, short neck diaphragmatic breathing, left axillary nodes, anterior interaxiallary anastamosis with instruciton in same all along. pt to try this to help with tightness at anterior shoulder the to sidelying for posterior anatamosis and lateral trunk                        Long Term Clinic Goals - 06/02/15 1552    CC Long Term Goal  #1   Title Patient with verbalize an understanding of  lymphedema risk reduction precautions   Status On-going   CC Long Term Goal  #2   Title Patient will be independent in a  home exercise program   Status On-going   CC Long Term Goal  #3   Title Patient will be know how to obtain and use compression garments for maintenance phase of treatment   Status On-going   CC Long Term Goal  #4   Title Patient will improve right  shoulder abduction to 120 degrees so that she can achieve position needed for radiation therapy.   Status Achieved            Plan - 06/07/15 1308    Clinical Impression Statement signed up for ABC class on aug 15 . she has had some benefit from intermittent use of Tg soft.  suggested she wear it at night if she is feeling fullness and achiness.  Pt has some fullness in right anterior chest and upper lateral trunk that seemed to decrease with manual lymph drainage.  she said she felt "less full" after treatment. Issed patch of 1/4 inch foam for pt to wear under compression bra to see if it will help with decreasing fluid here. .    PT Next Visit Plan  assess for anterior and lateral  chest swelling and effectiveness of Tg soft and foam. If needed consider axillary chip pack  continue with shoulder range of motion and exercise.     Consulted and Agree with Plan of Care Patient        Problem List Patient Active Problem List   Diagnosis Date Noted  . Genetic testing 04/27/2015  . Breast cancer   . Family history of breast cancer   . Breast cancer of upper-outer quadrant of right female breast 03/29/2015  . S/P laparoscopic assisted vaginal hysterectomy (LAVH) 07/27/2014   Donato Heinz. Owens Shark, PT  06/07/2015, 2:10 PM  [place], [place], [address] Phone: [phone]   Fax:  [phone]

## 2015-06-07 NOTE — Progress Notes (Signed)
Name: [name]   MRN: [id number]  Date:  06/07/2015  DOB: 12/25/1970  Status:outpatient    DIAGNOSIS: Breast cancer.  CONSENT VERIFIED: yes   SET UP: Patient is setup supine   IMMOBILIZATION:  The following immobilization was used:Custom Moldable Pillow, breast board.   NARRATIVE: Ms. Mcgraw was brought to the [place].  Identity was confirmed.  All relevant records and images related to the planned course of therapy were reviewed.  Then, the patient was positioned in a stable reproducible clinical set-up for radiation therapy.  Wires were placed to delineate the clinical extent of breast tissue. A wire was placed on the scar as well.  CT images were obtained.  An isocenter was placed. Skin markings were placed.  The CT images were loaded into the planning software where the target and avoidance structures were contoured.  The radiation prescription was entered and confirmed. The patient was discharged in stable condition and tolerated simulation well.    TREATMENT PLANNING NOTE:  Treatment planning then occurred. I have requested : MLC's, isodose plan, basic dose calculation  I personally designed and supervised the construction of 3 medically necessary complex treatment devices for the protection of critical normal structures including the lungs and contralateral breast as well as the immobilization device which is necessary for set up certainty.   3D simulation occurred. I requested and analyzed a dose volume histogram of the heart, lungs and lumpectomy cavity.

## 2015-06-10 ENCOUNTER — Ambulatory Visit: Payer: Commercial Managed Care - PPO | Admitting: Physical Therapy

## 2015-06-10 ENCOUNTER — Telehealth: Payer: Self-pay | Admitting: Hematology and Oncology

## 2015-06-10 ENCOUNTER — Other Ambulatory Visit: Payer: Self-pay

## 2015-06-10 NOTE — Telephone Encounter (Signed)
9/15 appointment made and placed a note for patient to get a new schedule 8/17

## 2015-06-13 ENCOUNTER — Ambulatory Visit: Payer: Commercial Managed Care - PPO | Admitting: Physical Therapy

## 2015-06-13 DIAGNOSIS — Z9189 Other specified personal risk factors, not elsewhere classified: Secondary | ICD-10-CM | POA: Diagnosis not present

## 2015-06-13 DIAGNOSIS — M25611 Stiffness of right shoulder, not elsewhere classified: Secondary | ICD-10-CM

## 2015-06-13 NOTE — Patient Instructions (Signed)
Do once a day:  Stand with one end of red Theraband in each hand.  Hold left hand down near hip.  With right elbow straight, raise arm up and out to the side in a diagonal, like unsheathing a sword.  Do 10 repetitions.    Do the same with the other arm.   Stand with Theraband around a doorknob and step outside the door, shutting the door on the Theraband.  Pull Theraband back, bending your elbows and pulling back as far as possible.  Should feel resistance that makes you work.  Do 10 repetitions.  With each exercise, increase repetitions gradually up to 20 total.

## 2015-06-13 NOTE — Therapy (Signed)
[place], [place], [address] Phone: [phone]   Fax:  [phone]  Physical Therapy Treatment  Patient Details  Name: [name] MRN: [id number] Date of Birth: Oct 20, 1971 Referring Provider:  [name], MD  Encounter Date: 06/13/2015      PT End of Session - 06/13/15 1717    Visit Number 4   Number of Visits 9   Date for PT Re-Evaluation 06/26/15   PT Start Time 1525   PT Stop Time 1610   PT Time Calculation (min) 45 min   Activity Tolerance Patient tolerated treatment well   Behavior During Therapy [facility] for tasks assessed/performed      Past Medical History  Diagnosis Date  . Menorrhagia   . PCOS (polycystic ovarian syndrome)   . Hypertension   . Hypothyroidism   . Invasive ductal carcinoma of right breast in female 03/22/2015    er+/PR+/her2-  . Family history of breast cancer     Past Surgical History  Procedure Laterality Date  . Cesarean section  10-09-2000  &  1999  . Appendectomy  02-21-2009  . Tonsillectomy  age 53  . Achilles tendon repair Right 2002  . Hysteroscopy with novasure  11/ 2014  . Laparoscopic assisted vaginal hysterectomy N/A 07/27/2014    Procedure: LAPAROSCOPIC ASSISTED VAGINAL HYSTERECTOMY;  Surgeon: [name], MD;  Location: [facility];  Service: Gynecology;  Laterality: N/A;  . Bilateral salpingectomy Bilateral 07/27/2014    Procedure: BILATERAL SALPINGECTOMY;  Surgeon: [name], MD;  Location: [facility];  Service: Gynecology;  Laterality: Bilateral;  . Breast, right needle core biospy Right 03/22/15  . Radioactive seed guided mastectomy with axillary sentinel lymph node biopsy Right 04/28/2015    Procedure: RADIOACTIVE SEED GUIDED RIGHT BREAST PARTIAL MASTECTOMY WITH RIGHT AXILLARY SENTINEL LYMPH NODE BIOPSY;  Surgeon: [name], MD;  Location: [place];  Service: General;   Laterality: Right;    There were no vitals filed for this visit.  Visit Diagnosis:  At risk for lymphedema  Stiffness of joint, shoulder region, right      Subjective Assessment - 06/13/15 1527    Subjective A little more sore at right anterior chest wall lately.  Did okay with CT simulation except for my pre-existing shoulder problems.   Currently in Pain? Yes   Pain Score 4    Pain Location Chest   Pain Orientation Right;Upper   Pain Descriptors / Indicators Sore   Aggravating Factors  worse as the day goes on   Pain Relieving Factors using small pillow under arm                         [facility] Adult PT Treatment/Exercise - 06/13/15 0001    Shoulder Exercises: Standing   Other Standing Exercises roll yellow ball up wall x 10; finger ladder for right shoulder abduction x 7   Other Standing Exercises 3-way arm raises with 1 lb. weight 1# x 10 each direction; overhead press alternating left and right arm 1# x 10; shoulder D2 vs. red Theraband x 10 each side;   Shoulder Exercises: Pulleys   Flexion 2 minutes   ABduction 2 minutes   Manual Therapy   Manual Lymphatic Drainage (MLD) diaphragmatic breathing, short neck, left axilla and anterior interaxillary anastomosis, right groin in supine; in left sidelying, right axillo-inguinal anastomosis and posterior interaxillary anastomosis  PT Education - 06/13/15 1716    Education provided Yes   Education Details red Theraband D2 exercises and bent-elbow scapular retraction   Person(s) Educated Patient   Methods Explanation;Demonstration;Handout   Comprehension Verbalized understanding;Returned demonstration                Long Term Clinic Goals - 06/02/15 1552    CC Long Term Goal  #1   Title Patient with verbalize an understanding of lymphedema risk reduction precautions   Status On-going   CC Long Term Goal  #2   Title Patient will be independent in a  home exercise program    Status On-going   CC Long Term Goal  #3   Title Patient will be know how to obtain and use compression garments for maintenance phase of treatment   Status On-going   CC Long Term Goal  #4   Title Patient will improve right  shoulder abduction to 120 degrees so that she can achieve position needed for radiation therapy.   Status Achieved            Plan - 06/13/15 1717    Clinical Impression Statement Couldn't come to ABC class today so will try to cover that information during her therapy sessions, since otherwise it will be a month before the next class.  Patient seems to be doing well, as she has tolerated radiation simulation.  She reports continued benefit of manual lymph drainage, partly for makin her more comfortable.   Pt will benefit from skilled therapeutic intervention in order to improve on the following deficits Decreased range of motion;Pain;Impaired UE functional use;Decreased knowledge of use of DME;Decreased strength;Postural dysfunction   Rehab Potential Excellent   PT Frequency 2x / week   PT Duration 4 weeks   PT Treatment/Interventions Therapeutic exercise;Manual techniques;Manual lymph drainage;Patient/family education   PT Next Visit Plan Reassess.  Continue therapeutic exercise and manual techniques.   PT Home Exercise Plan strength ABC program eventually    Consulted and Agree with Plan of Care Patient        Problem List Patient Active Problem List   Diagnosis Date Noted  . Genetic testing 04/27/2015  . Breast cancer   . Family history of breast cancer   . Breast cancer of upper-outer quadrant of right female breast 03/29/2015  . S/P laparoscopic assisted vaginal hysterectomy (LAVH) 07/27/2014    SALISBURY,DONNA 06/13/2015, 5:21 PM  [facility] [address] [place], [place], [address] Phone: [phone]   Fax:  [phone]     Donna Salisbury, PT 06/13/2015 5:21 PM  

## 2015-06-14 ENCOUNTER — Ambulatory Visit
Admission: RE | Admit: 2015-06-14 | Discharge: 2015-06-14 | Disposition: A | Discharge status: Home / Self Care | Payer: Commercial Managed Care - PPO | Source: Ambulatory Visit | Attending: Radiation Oncology | Admitting: Radiation Oncology

## 2015-06-14 DIAGNOSIS — Z510 Encounter for antineoplastic radiation therapy: Secondary | ICD-10-CM | POA: Diagnosis not present

## 2015-06-15 ENCOUNTER — Ambulatory Visit
Admission: RE | Admit: 2015-06-15 | Discharge: 2015-06-15 | Disposition: A | Discharge status: Home / Self Care | Payer: Commercial Managed Care - PPO | Source: Ambulatory Visit | Attending: Radiation Oncology | Admitting: Radiation Oncology

## 2015-06-15 ENCOUNTER — Ambulatory Visit: Payer: Commercial Managed Care - PPO | Admitting: Physical Therapy

## 2015-06-15 DIAGNOSIS — C50411 Malignant neoplasm of upper-outer quadrant of right female breast: Secondary | ICD-10-CM

## 2015-06-15 DIAGNOSIS — Z510 Encounter for antineoplastic radiation therapy: Secondary | ICD-10-CM | POA: Diagnosis not present

## 2015-06-15 MED ORDER — RADIAPLEXRX EX GEL
Freq: Once | CUTANEOUS | Status: AC
  Administered 2015-06-15: 18:00:00 via TOPICAL

## 2015-06-15 MED ORDER — ALRA NON-METALLIC DEODORANT (RAD-ONC)
1.0000 "application " | Freq: Once | TOPICAL | Status: AC
  Administered 2015-06-15: 1 via TOPICAL

## 2015-06-16 ENCOUNTER — Ambulatory Visit
Admission: RE | Admit: 2015-06-16 | Discharge: 2015-06-16 | Disposition: A | Discharge status: Home / Self Care | Payer: Commercial Managed Care - PPO | Source: Ambulatory Visit | Attending: Radiation Oncology | Admitting: Radiation Oncology

## 2015-06-16 DIAGNOSIS — Z510 Encounter for antineoplastic radiation therapy: Secondary | ICD-10-CM | POA: Diagnosis not present

## 2015-06-17 ENCOUNTER — Ambulatory Visit
Admission: RE | Admit: 2015-06-17 | Discharge: 2015-06-17 | Disposition: A | Discharge status: Home / Self Care | Payer: Commercial Managed Care - PPO | Source: Ambulatory Visit | Attending: Radiation Oncology | Admitting: Radiation Oncology

## 2015-06-17 ENCOUNTER — Ambulatory Visit: Payer: Commercial Managed Care - PPO | Admitting: Physical Therapy

## 2015-06-17 DIAGNOSIS — M25611 Stiffness of right shoulder, not elsewhere classified: Secondary | ICD-10-CM

## 2015-06-17 DIAGNOSIS — Z510 Encounter for antineoplastic radiation therapy: Secondary | ICD-10-CM | POA: Diagnosis not present

## 2015-06-17 DIAGNOSIS — Z9189 Other specified personal risk factors, not elsewhere classified: Secondary | ICD-10-CM

## 2015-06-17 NOTE — Therapy (Addendum)
[place], [place], [address] Phone: [phone]   Fax:  [phone]  Physical Therapy Treatment  Patient Details  Name: [name] MRN: [id number] Date of Birth: [birth year] Referring Provider:  [name], MD  Encounter Date: 06/17/2015    Past Medical History  Diagnosis Date  . Menorrhagia   . PCOS (polycystic ovarian syndrome)   . Hypertension   . Hypothyroidism   . Invasive ductal carcinoma of right breast in female [facility]) 03/22/2015    er+/PR+/her2-  . Family history of breast cancer   . Radiation 06/15/15-07/14/15    Right Breast ductal ca    Past Surgical History  Procedure Laterality Date  . Cesarean section  10-09-2000  &  1999  . Appendectomy  02-21-2009  . Tonsillectomy  age 64  . Achilles tendon repair Right 2002  . Hysteroscopy with novasure  11/ 2014  . Laparoscopic assisted vaginal hysterectomy N/A 07/27/2014    Procedure: LAPAROSCOPIC ASSISTED VAGINAL HYSTERECTOMY;  Surgeon: [name], MD;  Location: [facility];  Service: Gynecology;  Laterality: N/A;  . Bilateral salpingectomy Bilateral 07/27/2014    Procedure: BILATERAL SALPINGECTOMY;  Surgeon: [name], MD;  Location[facility];  Service: Gynecology;  Laterality: Bilateral;  . Breast, right needle core biospy Right 03/22/15  . Radioactive seed guided mastectomy with axillary sentinel lymph node biopsy Right 04/28/2015    Procedure: RADIOACTIVE SEED GUIDED RIGHT BREAST PARTIAL MASTECTOMY WITH RIGHT AXILLARY SENTINEL LYMPH NODE BIOPSY;  Surgeon: [name], MD;  Location: [place];  Service: General;  Laterality: Right;    There were no vitals filed for this visit.  Visit Diagnosis:  At risk for lymphedema  Stiffness of joint, shoulder region, right                                       Long Term Clinic Goals -  06/17/15 1002    CC Long Term Goal  #1   Title Patient with verbalize an understanding of lymphedema risk reduction precautions   Status Achieved   CC Long Term Goal  #2   Title Patient will be independent in a  home exercise program   Status Partially Met   CC Long Term Goal  #3   Title Patient will be know how to obtain and use compression garments for maintenance phase of treatment   Status Partially Met   CC Long Term Goal  #4   Title Patient will improve right  shoulder abduction to 120 degrees so that she can achieve position needed for radiation therapy.   Status Achieved   CC Long Term Goal  #5   Title Patient will decrease the DASH score to < 20   to demonstrate increased functional use of upper extremity   Status Partially Met            Problem List Patient Active Problem List   Diagnosis Date Noted  . Right upper quadrant abdominal pain 12/26/2015  . Genetic testing 04/27/2015  . Family history of breast cancer   . Breast cancer of upper-outer quadrant of right female breast ([place]) 03/29/2015  . S/P laparoscopic assisted vaginal hysterectomy (LAVH) 07/27/2014    [name] 12/29/2015, 3:22 PM  [place], [place], [address] Phone: [phone]   Fax:  [phone]  [name], PT 12/29/2015 3:22 PM   PHYSICAL THERAPY DISCHARGE SUMMARY  Visits from Start of Care: 5  Current functional level related to goals / functional outcomes: Goals partially met as noted above.   Remaining deficits: Unknown: patient did not return for follow-up as planned.   Education / Equipment: HEP, education  Plan: Patient agrees to discharge.  Patient goals were partially met. Patient is being discharged due to not returning since the last visit.  ?????    [name], PT 12/29/2015 3:22 PM

## 2015-06-20 ENCOUNTER — Ambulatory Visit
Admission: RE | Admit: 2015-06-20 | Discharge: 2015-06-20 | Disposition: A | Discharge status: Home / Self Care | Payer: Commercial Managed Care - PPO | Source: Ambulatory Visit | Attending: Radiation Oncology | Admitting: Radiation Oncology

## 2015-06-20 DIAGNOSIS — Z510 Encounter for antineoplastic radiation therapy: Secondary | ICD-10-CM | POA: Diagnosis not present

## 2015-06-21 ENCOUNTER — Ambulatory Visit
Admission: RE | Admit: 2015-06-21 | Discharge: 2015-06-21 | Disposition: A | Discharge status: Home / Self Care | Payer: Commercial Managed Care - PPO | Source: Ambulatory Visit | Attending: Radiation Oncology | Admitting: Radiation Oncology

## 2015-06-21 ENCOUNTER — Encounter: Payer: Self-pay | Admitting: Radiation Oncology

## 2015-06-21 ENCOUNTER — Ambulatory Visit: Payer: Commercial Managed Care - PPO | Admitting: Physical Therapy

## 2015-06-21 ENCOUNTER — Encounter: Payer: Self-pay | Admitting: *Deleted

## 2015-06-21 VITALS — BP 128/93 | HR 82 | Temp 97.9°F | Resp 20 | Wt 165.2 lb

## 2015-06-21 DIAGNOSIS — C50411 Malignant neoplasm of upper-outer quadrant of right female breast: Secondary | ICD-10-CM

## 2015-06-21 DIAGNOSIS — Z510 Encounter for antineoplastic radiation therapy: Secondary | ICD-10-CM | POA: Diagnosis not present

## 2015-06-21 NOTE — Progress Notes (Addendum)
Weekly rad txs  Right breast  5/21 completd, slight skin pink color under axilla and on breast,skin intact, slight discomfort, using radiaplex bid, very fatigued, takes an hour nap after rad txs  Daily stated,appetite good,drinks plenty water, no pain in breast  1:40 PM Wt Readings from Last 3 Encounters:  06/02/15 166 lb 1.6 oz (75.342 kg)  05/04/15 164 lb 1.6 oz (74.435 kg)  04/28/15 164 lb (74.39 kg)

## 2015-06-21 NOTE — Progress Notes (Signed)
Weekly Management Note Current Dose: 2.67  Gy  Projected Dose: 52.72 Gy   Narrative:  The patient presents for routine under treatment assessment.  CBCT/MVCT images/Port film x-rays were reviewed.  The chart was checked. Fatigue. No skin changes.   Physical Findings: Weight: 165 lb 3.2 oz (74.934 kg). Unchanged  Impression:  The patient is tolerating radiation.  Plan:  Continue treatment as planned. RN education performed. Discussed fatigue management.

## 2015-06-22 ENCOUNTER — Ambulatory Visit
Admission: RE | Admit: 2015-06-22 | Discharge: 2015-06-22 | Disposition: A | Discharge status: Home / Self Care | Payer: Commercial Managed Care - PPO | Source: Ambulatory Visit | Attending: Radiation Oncology | Admitting: Radiation Oncology

## 2015-06-22 DIAGNOSIS — Z510 Encounter for antineoplastic radiation therapy: Secondary | ICD-10-CM | POA: Diagnosis not present

## 2015-06-23 ENCOUNTER — Ambulatory Visit
Admission: RE | Admit: 2015-06-23 | Discharge: 2015-06-23 | Disposition: A | Discharge status: Home / Self Care | Payer: Commercial Managed Care - PPO | Source: Ambulatory Visit | Attending: Radiation Oncology | Admitting: Radiation Oncology

## 2015-06-23 DIAGNOSIS — Z510 Encounter for antineoplastic radiation therapy: Secondary | ICD-10-CM | POA: Diagnosis not present

## 2015-06-24 ENCOUNTER — Ambulatory Visit
Admission: RE | Admit: 2015-06-24 | Discharge: 2015-06-24 | Disposition: A | Discharge status: Home / Self Care | Payer: Commercial Managed Care - PPO | Source: Ambulatory Visit | Attending: Radiation Oncology | Admitting: Radiation Oncology

## 2015-06-24 DIAGNOSIS — Z510 Encounter for antineoplastic radiation therapy: Secondary | ICD-10-CM | POA: Diagnosis not present

## 2015-06-27 ENCOUNTER — Ambulatory Visit
Admission: RE | Admit: 2015-06-27 | Discharge: 2015-06-27 | Disposition: A | Discharge status: Home / Self Care | Payer: Commercial Managed Care - PPO | Source: Ambulatory Visit | Attending: Radiation Oncology | Admitting: Radiation Oncology

## 2015-06-27 DIAGNOSIS — Z510 Encounter for antineoplastic radiation therapy: Secondary | ICD-10-CM | POA: Diagnosis not present

## 2015-06-28 ENCOUNTER — Ambulatory Visit
Admission: RE | Admit: 2015-06-28 | Discharge: 2015-06-28 | Disposition: A | Discharge status: Home / Self Care | Payer: Commercial Managed Care - PPO | Source: Ambulatory Visit | Attending: Radiation Oncology | Admitting: Radiation Oncology

## 2015-06-28 VITALS — BP 117/89 | HR 90 | Temp 98.2°F | Wt 165.3 lb

## 2015-06-28 DIAGNOSIS — C50411 Malignant neoplasm of upper-outer quadrant of right female breast: Secondary | ICD-10-CM

## 2015-06-28 DIAGNOSIS — Z510 Encounter for antineoplastic radiation therapy: Secondary | ICD-10-CM | POA: Diagnosis not present

## 2015-06-28 NOTE — Progress Notes (Signed)
Weekly Management Note Current Dose: 26.7  Gy  Projected Dose: 52.72 Gy   Narrative:  The patient presents for routine under treatment assessment.  CBCT/MVCT images/Port film x-rays were reviewed.  The chart was checked. Fatigue/sore throat/lowgrade fever. No skin changes.   Physical Findings: Weight: 165 lb 4.8 oz (74.98 kg). Unchanged  Impression:  The patient is tolerating radiation.  Plan:  Continue treatment as planned. RN education performed. Discussed fatigue management.

## 2015-06-28 NOTE — Progress Notes (Signed)
Weekly assessment of radiation to right breast.Completed 10 of 21 treatments.Nipple soreness.Skin pink right chest wall.Having some cold symptoms .Instructed to take multivitamin and exercise.States she notices body achiness a few hours after treatment.

## 2015-06-29 ENCOUNTER — Ambulatory Visit
Admission: RE | Admit: 2015-06-29 | Discharge: 2015-06-29 | Disposition: A | Discharge status: Home / Self Care | Payer: Commercial Managed Care - PPO | Source: Ambulatory Visit | Attending: Radiation Oncology | Admitting: Radiation Oncology

## 2015-06-29 DIAGNOSIS — Z510 Encounter for antineoplastic radiation therapy: Secondary | ICD-10-CM | POA: Diagnosis not present

## 2015-06-30 ENCOUNTER — Ambulatory Visit
Admission: RE | Admit: 2015-06-30 | Discharge: 2015-06-30 | Disposition: A | Discharge status: Home / Self Care | Payer: Commercial Managed Care - PPO | Source: Ambulatory Visit | Attending: Radiation Oncology | Admitting: Radiation Oncology

## 2015-06-30 DIAGNOSIS — Z510 Encounter for antineoplastic radiation therapy: Secondary | ICD-10-CM | POA: Diagnosis not present

## 2015-07-01 ENCOUNTER — Ambulatory Visit
Admission: RE | Admit: 2015-07-01 | Discharge: 2015-07-01 | Disposition: A | Discharge status: Home / Self Care | Payer: Commercial Managed Care - PPO | Source: Ambulatory Visit | Attending: Radiation Oncology | Admitting: Radiation Oncology

## 2015-07-01 DIAGNOSIS — Z510 Encounter for antineoplastic radiation therapy: Secondary | ICD-10-CM | POA: Diagnosis not present

## 2015-07-05 ENCOUNTER — Ambulatory Visit
Admission: RE | Admit: 2015-07-05 | Discharge: 2015-07-05 | Disposition: A | Discharge status: Home / Self Care | Payer: Commercial Managed Care - PPO | Source: Ambulatory Visit | Attending: Radiation Oncology | Admitting: Radiation Oncology

## 2015-07-05 DIAGNOSIS — Z510 Encounter for antineoplastic radiation therapy: Secondary | ICD-10-CM | POA: Diagnosis not present

## 2015-07-05 DIAGNOSIS — C50411 Malignant neoplasm of upper-outer quadrant of right female breast: Secondary | ICD-10-CM

## 2015-07-05 NOTE — Progress Notes (Signed)
Weekly Management Note Current Dose: 37.38 Gy  Projected Dose: 52.72 Gy   Narrative:  The patient presents for routine under treatment assessment.  CBCT/MVCT images/Port film x-rays were reviewed.  The chart was checked. Irritation in axilla. Verified eelctron set up on treatment machine. .   Physical Findings:  Dry desquamation in axilla.   Impression:  The patient is tolerating radiation.  Plan:  Continue treatment as planned. Add hydrogel pads and switch to biafene.

## 2015-07-06 ENCOUNTER — Ambulatory Visit
Admission: RE | Admit: 2015-07-06 | Discharge: 2015-07-06 | Disposition: A | Discharge status: Home / Self Care | Payer: Commercial Managed Care - PPO | Source: Ambulatory Visit | Attending: Radiation Oncology | Admitting: Radiation Oncology

## 2015-07-06 DIAGNOSIS — Z510 Encounter for antineoplastic radiation therapy: Secondary | ICD-10-CM | POA: Diagnosis not present

## 2015-07-07 ENCOUNTER — Ambulatory Visit
Admission: RE | Admit: 2015-07-07 | Discharge: 2015-07-07 | Disposition: A | Discharge status: Home / Self Care | Payer: Commercial Managed Care - PPO | Source: Ambulatory Visit | Attending: Radiation Oncology | Admitting: Radiation Oncology

## 2015-07-07 DIAGNOSIS — Z510 Encounter for antineoplastic radiation therapy: Secondary | ICD-10-CM | POA: Diagnosis not present

## 2015-07-08 ENCOUNTER — Ambulatory Visit
Admission: RE | Admit: 2015-07-08 | Discharge: 2015-07-08 | Disposition: A | Discharge status: Home / Self Care | Payer: Commercial Managed Care - PPO | Source: Ambulatory Visit | Attending: Radiation Oncology | Admitting: Radiation Oncology

## 2015-07-08 DIAGNOSIS — Z510 Encounter for antineoplastic radiation therapy: Secondary | ICD-10-CM | POA: Diagnosis not present

## 2015-07-11 ENCOUNTER — Ambulatory Visit
Admission: RE | Admit: 2015-07-11 | Discharge: 2015-07-11 | Disposition: A | Discharge status: Home / Self Care | Payer: Commercial Managed Care - PPO | Source: Ambulatory Visit | Attending: Radiation Oncology | Admitting: Radiation Oncology

## 2015-07-11 DIAGNOSIS — Z510 Encounter for antineoplastic radiation therapy: Secondary | ICD-10-CM | POA: Diagnosis not present

## 2015-07-12 ENCOUNTER — Ambulatory Visit
Admission: RE | Admit: 2015-07-12 | Discharge: 2015-07-12 | Disposition: A | Discharge status: Home / Self Care | Payer: Commercial Managed Care - PPO | Source: Ambulatory Visit | Attending: Radiation Oncology | Admitting: Radiation Oncology

## 2015-07-12 DIAGNOSIS — Z510 Encounter for antineoplastic radiation therapy: Secondary | ICD-10-CM | POA: Diagnosis not present

## 2015-07-13 ENCOUNTER — Ambulatory Visit
Admission: RE | Admit: 2015-07-13 | Discharge: 2015-07-13 | Disposition: A | Discharge status: Home / Self Care | Payer: Commercial Managed Care - PPO | Source: Ambulatory Visit | Attending: Radiation Oncology | Admitting: Radiation Oncology

## 2015-07-13 ENCOUNTER — Other Ambulatory Visit: Payer: Self-pay

## 2015-07-13 ENCOUNTER — Ambulatory Visit
Admission: RE | Admit: 2015-07-13 | Discharge: 2015-07-13 | Disposition: A | Discharge status: Home / Self Care | Payer: Commercial Managed Care - PPO | Source: Ambulatory Visit | Admitting: Radiation Oncology

## 2015-07-13 VITALS — BP 135/90 | HR 82 | Temp 97.8°F | Wt 165.1 lb

## 2015-07-13 DIAGNOSIS — C50411 Malignant neoplasm of upper-outer quadrant of right female breast: Secondary | ICD-10-CM

## 2015-07-13 DIAGNOSIS — Z510 Encounter for antineoplastic radiation therapy: Secondary | ICD-10-CM | POA: Diagnosis not present

## 2015-07-13 MED ORDER — BIAFINE EX EMUL
CUTANEOUS | Status: DC | PRN
  Administered 2015-07-13: 10:00:00 via TOPICAL

## 2015-07-13 NOTE — Progress Notes (Signed)
Weekly Management Note:  Site: right breast Current Dose:   5072  cGy Projected Dose:  5272  cGy  Narrative: The patient is seen today for routine under treatment assessment. CBCT/MVCT images/port films were reviewed. The chart was reviewed.    She is into her final week of hypofractionated radiation therapy to her right breast. She is doing doing well, however she does have slight discomfort and pruritus along her axilla, upper inner quadrant of the right breast and also nipple regions. She will finish her treatment tomorrow.  Physical Examination:  Filed Vitals:   07/13/15 1003  BP: 135/90  Pulse: 82  Temp: 97.8 F (36.6 C)  .  Weight: 165 lb 1.6 oz (74.889 kg).  There is moderate erythema and hyperpigmentation the skin along the right breast with a  small area of dry desquamation along the axilla.  No areas of moist desquamation.  Impression: Tolerating radiation therapy well.  She will finish her radiation therapy tomorrow.  Plan: Continue radiation therapy as planned.

## 2015-07-13 NOTE — Addendum Note (Signed)
Encounter addended by: Norm Salt, RN on: 07/13/2015 10:29 AM<BR>     Documentation filed: Inpatient MAR

## 2015-07-13 NOTE — Progress Notes (Signed)
Weekly assessment of radiation to right breast.Completed 20 of 21 treatments.Has follicular rash.will give biafine to continue to apply over the next 2 to 3 weeks then change to lotion with vitamin e. BP 135/90 mmHg  Pulse 82  Temp(Src) 97.8 F (36.6 C)  Wt 165 lb 1.6 oz (74.889 kg)  LMP 07/14/2014

## 2015-07-14 ENCOUNTER — Encounter: Payer: Self-pay | Admitting: *Deleted

## 2015-07-14 ENCOUNTER — Ambulatory Visit
Admission: RE | Admit: 2015-07-14 | Discharge: 2015-07-14 | Disposition: A | Discharge status: Home / Self Care | Payer: Commercial Managed Care - PPO | Source: Ambulatory Visit | Attending: Radiation Oncology | Admitting: Radiation Oncology

## 2015-07-14 ENCOUNTER — Telehealth: Payer: Self-pay | Admitting: Hematology and Oncology

## 2015-07-14 ENCOUNTER — Encounter: Payer: Self-pay | Admitting: Hematology and Oncology

## 2015-07-14 ENCOUNTER — Ambulatory Visit (HOSPITAL_BASED_OUTPATIENT_CLINIC_OR_DEPARTMENT_OTHER): Payer: Commercial Managed Care - PPO | Admitting: Hematology and Oncology

## 2015-07-14 ENCOUNTER — Encounter: Payer: Self-pay | Admitting: Radiation Oncology

## 2015-07-14 ENCOUNTER — Other Ambulatory Visit (HOSPITAL_BASED_OUTPATIENT_CLINIC_OR_DEPARTMENT_OTHER): Payer: Commercial Managed Care - PPO

## 2015-07-14 VITALS — BP 137/91 | HR 91 | Temp 98.7°F | Resp 18 | Ht 66.0 in | Wt 165.7 lb

## 2015-07-14 DIAGNOSIS — C50411 Malignant neoplasm of upper-outer quadrant of right female breast: Secondary | ICD-10-CM | POA: Diagnosis not present

## 2015-07-14 DIAGNOSIS — Z510 Encounter for antineoplastic radiation therapy: Secondary | ICD-10-CM | POA: Diagnosis not present

## 2015-07-14 LAB — CBC WITH DIFFERENTIAL/PLATELET
BASO%: 0.8 % (ref 0.0–2.0)
BASOS ABS: 0 10*3/uL (ref 0.0–0.1)
EOS%: 2.1 % (ref 0.0–7.0)
Eosinophils Absolute: 0.1 10*3/uL (ref 0.0–0.5)
HCT: 40.7 % (ref 34.8–46.6)
HGB: 14.8 g/dL (ref 11.6–15.9)
LYMPH#: 1 10*3/uL (ref 0.9–3.3)
LYMPH%: 21 % (ref 14.0–49.7)
MCH: 31.5 pg (ref 25.1–34.0)
MCHC: 36.4 g/dL — AB (ref 31.5–36.0)
MCV: 86.6 fL (ref 79.5–101.0)
MONO#: 0.3 10*3/uL (ref 0.1–0.9)
MONO%: 5.9 % (ref 0.0–14.0)
NEUT#: 3.3 10*3/uL (ref 1.5–6.5)
NEUT%: 70.2 % (ref 38.4–76.8)
Platelets: 189 10*3/uL (ref 145–400)
RBC: 4.7 10*6/uL (ref 3.70–5.45)
RDW: 12.5 % (ref 11.2–14.5)
WBC: 4.7 10*3/uL (ref 3.9–10.3)

## 2015-07-14 LAB — COMPREHENSIVE METABOLIC PANEL (CC13)
ALT: 20 U/L (ref 0–55)
AST: 14 U/L (ref 5–34)
Albumin: 4.5 g/dL (ref 3.5–5.0)
Alkaline Phosphatase: 60 U/L (ref 40–150)
Anion Gap: 10 mEq/L (ref 3–11)
BUN: 12.9 mg/dL (ref 7.0–26.0)
CHLORIDE: 106 meq/L (ref 98–109)
CO2: 24 meq/L (ref 22–29)
CREATININE: 0.8 mg/dL (ref 0.6–1.1)
Calcium: 9.8 mg/dL (ref 8.4–10.4)
EGFR: 85 mL/min/{1.73_m2} — ABNORMAL LOW (ref >90)
Glucose: 131 mg/dl (ref 70–140)
POTASSIUM: 3.1 meq/L — AB (ref 3.5–5.1)
SODIUM: 140 meq/L (ref 136–145)
Total Bilirubin: 0.76 mg/dL (ref 0.20–1.20)
Total Protein: 7.6 g/dL (ref 6.4–8.3)

## 2015-07-14 MED ORDER — TAMOXIFEN CITRATE 20 MG PO TABS: 20.0000 mg | ORAL_TABLET | Freq: Every day | ORAL | Status: DC

## 2015-07-14 NOTE — Progress Notes (Signed)
EOT , no assessment done by nursing, was seen yesterday by MD Dr. Valere Dross, Dr. [name] to see patient patient has f/u appt card to make  9:57 AM

## 2015-07-14 NOTE — Telephone Encounter (Signed)
Gave avs & calendar for December. °

## 2015-07-14 NOTE — Progress Notes (Signed)
  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  Right breast boost/ 10 Gy at 2 Gy per fraction x 5 fractions  Beams/energy:  Opposed tangents with reduced fields / 6 MV photons Enface electrons / 12 MeV  Narrative: The patient tolerated radiation treatment relatively well.   She had nipple irritation and peeling in her axilla. This was treated with radiaplex and biafene.   Plan: The patient has completed radiation treatment. The patient will return to radiation oncology clinic for routine followup in one month. I advised them to call or return sooner if they have any questions or concerns related to their recovery or treatment.  ------------------------------------------------  Thea Silversmith, MD

## 2015-07-14 NOTE — Progress Notes (Signed)
Patient Care Team: Carol Ada, MD as PCP - General (Family Medicine)  DIAGNOSIS: No matching staging information was found for the patient.  SUMMARY OF ONCOLOGIC HISTORY:   Breast cancer of upper-outer quadrant of right female breast   03/16/2015 Mammogram  mammogram and ultrasound : right breast 12:00 position 1.4 x 1.3 x 1.2 cm hypoechoic irregular mass , right axillary lymph node with smooth cortical thickness 3 mm   03/22/2015 Initial Diagnosis Right breast biopsy: Invasive ductal carcinoma with calcifications , ER 98%, PR 98%, Ki-67 6%, HER-2 negative ratio 1.35   04/28/2015 Surgery Right lumpectomy: Invasive ductal carcinoma grade 1, 0.9 cm, low-grade DCIS, margins negative, 0/6 sentinel nodes negative, ER 98%, PR 98%, HER-2 negative, Ki-67 6%, T1 BN 0 stage IA Oncotype DX score 10 (7% ROR)   06/15/2015 - 07/14/2015 Radiation Therapy Adjuvant radiation therapy    CHIEF COMPLIANT: Follow-up after radiation  INTERVAL HISTORY: [name] is a 44 year old with above-mentioned history of right breast cancer who finished adjuvant radiation therapy. She is fairly sore from the effects of radiation as well as fatigue. She is here to discuss the next step in the treatment which would be with anti-estrogen therapy. She is accompanied by her husband today.  REVIEW OF SYSTEMS:   Constitutional: Denies fevers, chills or abnormal weight loss Eyes: Denies blurriness of vision Ears, nose, mouth, throat, and face: Denies mucositis or sore throat Respiratory: Denies cough, dyspnea or wheezes Cardiovascular: Denies palpitation, chest discomfort or lower extremity swelling Gastrointestinal:  Denies nausea, heartburn or change in bowel habits Skin: Denies abnormal skin rashes Lymphatics: Denies new lymphadenopathy or easy bruising Neurological:Denies numbness, tingling or new weaknesses Behavioral/Psych: Mood is stable, no new changes  Breast: Soreness from the effects of radiation All other systems  were reviewed with the patient and are negative.  I have reviewed the past medical history, past surgical history, social history and family history with the patient and they are unchanged from previous note.  ALLERGIES:  is allergic to ciprofloxacin and sulfa antibiotics.  MEDICATIONS:  Current Outpatient Prescriptions  Medication Sig Dispense Refill  . FeFum-FePoly-FA-B Cmp-C-Biot (INTEGRA PLUS PO) Take by mouth daily.    . hydrochlorothiazide (HYDRODIURIL) 25 MG tablet Take 25 mg by mouth every morning.    Marland Kitchen levothyroxine (SYNTHROID, LEVOTHROID) 50 MCG tablet Take 50 mcg by mouth daily before breakfast.    . metFORMIN (GLUCOPHAGE-XR) 500 MG 24 hr tablet Take 500 mg by mouth daily with breakfast.    . oxyCODONE-acetaminophen (PERCOCET) 10-325 MG per tablet Take 1 tablet by mouth every 6 (six) hours as needed for pain. (Patient not taking: Reported on 06/28/2015) 20 tablet 0  . tamoxifen (NOLVADEX) 20 MG tablet Take 1 tablet (20 mg total) by mouth daily. 90 tablet 3   No current facility-administered medications for this visit.    PHYSICAL EXAMINATION: ECOG PERFORMANCE STATUS: 1 - Symptomatic but completely ambulatory  Filed Vitals:   07/14/15 0836  BP: 137/91  Pulse: 91  Temp: 98.7 F (37.1 C)  Resp: 18   Filed Weights   07/14/15 0836  Weight: 165 lb 11.2 oz (75.161 kg)    GENERAL:alert, no distress and comfortable SKIN: skin color, texture, turgor are normal, no rashes or significant lesions EYES: normal, Conjunctiva are pink and non-injected, sclera clear OROPHARYNX:no exudate, no erythema and lips, buccal mucosa, and tongue normal  NECK: supple, thyroid normal size, non-tender, without nodularity LYMPH:  no palpable lymphadenopathy in the cervical, axillary or inguinal LUNGS: clear to auscultation and  percussion with normal breathing effort HEART: regular rate & rhythm and no murmurs and no lower extremity edema ABDOMEN:abdomen soft, non-tender and normal bowel  sounds Musculoskeletal:no cyanosis of digits and no clubbing  NEURO: alert & oriented x 3 with fluent speech, no focal motor/sensory deficits LABORATORY DATA:  I have reviewed the data as listed   Chemistry      Component Value Date/Time   NA 140 07/14/2015 0818   NA 138 04/26/2015 1405   K 3.1* 07/14/2015 0818   K 3.5 04/26/2015 1405   CL 101 04/26/2015 1405   CO2 24 07/14/2015 0818   CO2 29 04/26/2015 1405   BUN 12.9 07/14/2015 0818   BUN 8 04/26/2015 1405   CREATININE 0.8 07/14/2015 0818   CREATININE 0.67 04/26/2015 1405      Component Value Date/Time   CALCIUM 9.8 07/14/2015 0818   CALCIUM 9.6 04/26/2015 1405   ALKPHOS 60 07/14/2015 0818   ALKPHOS 67 04/26/2015 1405   AST 14 07/14/2015 0818   AST 24 04/26/2015 1405   ALT 20 07/14/2015 0818   ALT 29 04/26/2015 1405   BILITOT 0.76 07/14/2015 0818   BILITOT 0.9 04/26/2015 1405       Lab Results  Component Value Date   WBC 4.7 07/14/2015   HGB 14.8 07/14/2015   HCT 40.7 07/14/2015   MCV 86.6 07/14/2015   PLT 189 07/14/2015   NEUTROABS 3.3 07/14/2015   ASSESSMENT & PLAN:  Breast cancer of upper-outer quadrant of right female breast Right lumpectomy 04/28/2015: Invasive ductal carcinoma grade 1, 0.9 cm, low-grade DCIS, margins negative, 0/6 sentinel nodes negative, ER 98%, PR 98%, HER-2 negative, Ki-67 6%, T1 BN 0 stage IA, Oncotype DX score 10, 7% risk of recurrence, started radiation therapy 06/15/2015  Treatment plan: Since she completed adjuvant radiation, patient will start on tamoxifen therapy.  Tamoxifen counseling: I discussed with her about the risks and benefits of tamoxifen. These include but not limited to insomnia, hot flashes, mood changes, vaginal dryness, and weight gain. Although rare, serious side effects including  risk of blood clots were also discussed. We strongly believe that the benefits far outweigh the risks. Patient understands these risks and consented to starting treatment. Planned  treatment duration is 5-10 years.  I encouraged her to stay active doing yoga exercises and aerobics.  Return to clinic in 3 months for toxicity evaluation after starting tamoxifen therapy.   No orders of the defined types were placed in this encounter.   The patient has a good understanding of the overall plan. she agrees with it. she will call with any problems that may develop before the next visit here.   Gudena, Vinay K, MD      

## 2015-07-14 NOTE — Assessment & Plan Note (Signed)
Right lumpectomy 04/28/2015: Invasive ductal carcinoma grade 1, 0.9 cm, low-grade DCIS, margins negative, 0/6 sentinel nodes negative, ER 98%, PR 98%, HER-2 negative, Ki-67 6%, T1 BN 0 stage IA, Oncotype DX score 10, 7% risk of recurrence, started radiation therapy 06/15/2015  Treatment plan: Following completion of adjuvant radiation, patient will start on tamoxifen therapy. Tamoxifen counseling: I discussed with her about the risks and benefits of tamoxifen. These include but not limited to insomnia, hot flashes, mood changes, vaginal dryness, and weight gain. Although rare, serious side effects including endometrial cancer, risk of blood clots were also discussed. We strongly believe that the benefits far outweigh the risks. Patient understands these risks and consented to starting treatment. Planned treatment duration is 5-10 years.  Return to clinic in 3 months for toxicity evaluation after starting tamoxifen therapy.

## 2015-07-19 ENCOUNTER — Other Ambulatory Visit: Payer: Self-pay | Admitting: Radiation Oncology

## 2015-07-19 DIAGNOSIS — C50411 Malignant neoplasm of upper-outer quadrant of right female breast: Secondary | ICD-10-CM

## 2015-07-20 ENCOUNTER — Telehealth: Payer: Self-pay | Admitting: Hematology and Oncology

## 2015-07-20 NOTE — Telephone Encounter (Signed)
Spoke with patient and she is aware of her survivorship appointments

## 2015-07-28 ENCOUNTER — Encounter: Payer: Self-pay | Admitting: Radiation Oncology

## 2015-07-28 NOTE — Progress Notes (Signed)
Radiation Oncology         [phone]) [phone] ________________________________  Name: SHANTORIA ELLWOOD      MRN: [id number]          Date: 06/07/2015             DOB: Aug 29, 1971  Optical Surface Tracking Plan:  Since intensity modulated radiotherapy (IMRT) and 3D conformal radiation treatment methods are predicated on accurate and precise positioning for treatment, intrafraction motion monitoring is medically necessary to ensure accurate and safe treatment delivery.  The ability to quantify intrafraction motion without excessive ionizing radiation dose can only be performed with optical surface tracking. Accordingly, surface imaging offers the opportunity to obtain 3D measurements of patient position throughout IMRT and 3D treatments without excessive radiation exposure.  I am ordering optical surface tracking for this patient's upcoming course of radiotherapy. ________________________________ Signature   Reference:   Ursula Alert, J, et al. Surface imaging-based analysis of intrafraction motion for breast radiotherapy patients.Journal of [place], n. 6, nov. 2014. ISSN [id number].   Available at: <http://www.jacmp.org/index.php/jacmp/article/view/4957>.   This document serves as a record of services personally performed by Thea Silversmith , MD. It was created on her behalf by Lenn Cal, a trained medical scribe. The creation of this record is based on the scribe's personal observations and the provider's statements to them. This document has been checked and approved by the attending provider.   ------------------------------------------------  Thea Silversmith, MD

## 2015-07-28 NOTE — Progress Notes (Signed)
Name: [name]   MRN: [id number]  Date:  06/30/2015   DOB: February 22, 1971  Status:outpatient    DIAGNOSIS:  Stage I right breast cancer  CONSENT VERIFIED: yes   SET UP: Patient is setup supine   IMMOBILIZATION:  The following immobilization was used:Custom Moldable Pillow, breast board.   NARRATIVE: [name] underwent complex simulation and treatment planning for her boost treatment today.  Her tumor volume was outlined on the planning CT scan. The depth of her cavity was felt to be appropriate for treatment with electrons    12  MeV electrons will be prescribed to the 100%  isodose line.   I personally oversaw and approved the construction of a unique block which will be used for beam modification purposes.  An isodose plan is requested.    This document serves as a record of services personally performed by Thea Silversmith , MD. It was created on her behalf by Lenn Cal, a trained medical scribe. The creation of this record is based on the scribe's personal observations and the provider's statements to them. This document has been checked and approved by the attending provider.   ------------------------------------------------  Thea Silversmith, MD

## 2015-08-03 ENCOUNTER — Telehealth: Payer: Self-pay | Admitting: Nurse Practitioner

## 2015-08-03 NOTE — Telephone Encounter (Signed)
Routed message regarding pt's call of needing to reschedule survivorship care plan appointment in November 2017.  Called pt to reschedule; left message for return call.

## 2015-08-18 ENCOUNTER — Encounter: Payer: Self-pay | Admitting: Radiation Oncology

## 2015-08-18 ENCOUNTER — Ambulatory Visit
Admission: RE | Admit: 2015-08-18 | Discharge: 2015-08-18 | Disposition: A | Discharge status: Home / Self Care | Payer: Commercial Managed Care - PPO | Source: Ambulatory Visit | Attending: Radiation Oncology | Admitting: Radiation Oncology

## 2015-08-18 VITALS — BP 143/94 | HR 108 | Temp 98.4°F | Resp 20 | Ht 66.0 in | Wt 168.1 lb

## 2015-08-18 DIAGNOSIS — C50411 Malignant neoplasm of upper-outer quadrant of right female breast: Secondary | ICD-10-CM

## 2015-08-18 NOTE — Progress Notes (Signed)
Follow up s/p rd tx 06/15/15-07/14/15  Breast well healed  Under axilla tanning still,  Has occasional shootiong pains still but easing off stated, started Tamoxifen 07/30/15, had hot flashes just 2 days ago, appetite good, energy level better Survivorship 09/01/15 and f/u Dr. Lindi Adie 10/13/15, past 3 nights  C/o h/a back of head in the evenings, woke her up took advilprn 1:49 PM BP 143/94 mmHg  Pulse 108  Temp(Src) 98.4 F (36.9 C) (Oral)  Resp 20  Ht 5\' 6"  (1.676 m)  Wt 168 lb 1.6 oz (76.25 kg)  BMI 27.15 kg/m2  LMP 07/14/2014  Wt Readings from Last 3 Encounters:  08/18/15 168 lb 1.6 oz (76.25 kg)  07/14/15 165 lb 11.2 oz (75.161 kg)  07/13/15 165 lb 1.6 oz (74.889 kg)    1:48 PM

## 2015-08-18 NOTE — Progress Notes (Signed)
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 The site and does included the right breast at 42.72 Gy at 2.67 Gy per fraction x 21 fractions and the right breast boost at 10 Gy at 2 Gy per fraction x 5 fractions. The beams and energy used includes opposed tangents with reduced fields at 6 MV photons and enface electrons at 12 MeV.  Interval History: [name] presents today for routine follow-up appointment with radiation oncology. She reports occasional shootiong pains in the breast area, but that these shooting pains are not as frequent recently. She reports starting Tamoxifen on 07/30/2015 and takes this in the morning. Other symptoms reported include hot flashes, a healthy appetite, and improved energy level. She was told about survivorship on 09/01/2015 and has a follow up appointment with Dr. Lindi Adie, MD on 10/13/2015.   She complains of headaches. "It's in the back of my head the past three nights. One night it did wake me up, but it's dull," the patient reports about her head pain, confused of what triggers this. Advil alleviated the head pain the night that it woke her up. She also reports occasional blood when she has a bowel movement. For this reason, she is requesting a colonoscopy. The patient projected a healthy mental status and was not accompanied by her husband or children for today's visit. Her best friend was recently diagnoses with cancer of the left breast.   Physical Exam:  Filed Vitals:   08/18/15 1344  BP: 143/94  Pulse: 108  Temp: 98.4 F (36.9 C)  TempSrc: Oral  Resp: 20  Height: 5\' 6"  (1.676 m)  Weight: 168 lb 1.6 oz (76.25 kg)  The patient is alert and  oriented. There is no significant changes to the status of overall health to be noted at this time. Breast: Skin is slightly tan and volume loss over the right breast   IMPRESSION: [name] is a 44 y.o. female presenting to clinic in regards to her T1bN0 Invasive Ductal Carcinoma of the right female breast. She is recovering from the effects radiation well and appropriately managing reported symptoms. She is administering medications appropriately. The patient is also requesting a colonoscopy due to reported symptoms and her Grandfather passing away from colon cancer. She understands the importance of skin protection, application of vitamin E lotion, and completing an annual mammogram. She also understands the importance of waiting six months before pursing a symmetry procedure. The patient understands that she can access her appointments and medical records via [facility].   PLAN:  She is doing well. We discussed the need for follow up every 4-6 months which she has scheduled.  We discussed the need for yearly mammograms which she can schedule with her OBGYN or with medical oncology. We discussed the need for sun protection in the treated area.  She can always call me with questions.  I will follow up with her on an as needed basis. Healthy methods of management in regards to reported symptoms were reviewed in detail. She will contact her PCP and communicate her request for a colonoscopy, whom she is seeing next week. She will attend Survivorship in November of 2016 and follow-up with Dr. Lindi Adie, MD in  December of 2016. If she is interested in a symmetry procedure she has been advised of who to contact to pursue this option. All vocalized questions and concerns have been addressed. If the patient develops any further questions or concerns in regards to her treatment and recovery, she has been encouraged to contact Dr. [name], MD.   This document serves as a record of services personally performed by Thea Silversmith , MD. It was created on her behalf by Lenn Cal, a trained medical scribe. The creation of this record is based on the scribe's personal observations and the provider's statements to them. This document has been checked and approved by the attending provider.  ------------------------------------------------  Thea Silversmith, MD

## 2015-09-01 ENCOUNTER — Ambulatory Visit (HOSPITAL_BASED_OUTPATIENT_CLINIC_OR_DEPARTMENT_OTHER): Payer: Commercial Managed Care - PPO | Admitting: Nurse Practitioner

## 2015-09-01 ENCOUNTER — Encounter: Payer: Self-pay | Admitting: Nurse Practitioner

## 2015-09-01 VITALS — BP 132/92 | HR 76 | Temp 98.5°F | Resp 18 | Ht 66.0 in | Wt 168.8 lb

## 2015-09-01 DIAGNOSIS — C50411 Malignant neoplasm of upper-outer quadrant of right female breast: Secondary | ICD-10-CM | POA: Diagnosis not present

## 2015-09-01 DIAGNOSIS — G4700 Insomnia, unspecified: Secondary | ICD-10-CM | POA: Diagnosis not present

## 2015-09-01 NOTE — Progress Notes (Signed)
CLINIC:  Cancer Survivorship   REASON FOR VISIT:  Routine follow-up post-treatment for [name] recent history of breast cancer.  BRIEF ONCOLOGIC HISTORY:    Breast cancer of upper-outer quadrant of right female breast ([place])   03/16/2015 Mammogram Right breast: mass   03/16/2015 Breast US Right breast: hypoechoic irregular shadowing mass at 12 o'clock, 4 cm from the nipple measuring 1.4 x 1.3 x 1.2 cm. Right axillary lymph nodes with smooth cortical thickness   03/22/2015 Initial Biopsy Right breast biopsy: Invasive ductal carcinoma with calcifications; ER+ (98%), PR+ (98%), Ki-67 6%, HER-2 negative (ratio 1.35)   04/04/2015 Breast MRI Right breast: 2.0 cm hematoma/ seroma within the upper central portion of the breast following recent stereotactic guided core biopsy   04/05/2015 Clinical Stage Stage IA: T1c N0   04/06/2015 Procedure Genetics: OvaNext panel revealed  VUTS at [place] (c.2164G>[name]) otherwise negative at ATM, BARD1, BRCA1, BRCA2, BRIP1, CDH1, CHEK2, EPCAM, MLH1, MRE11A, MSH6, MUTYH, NBN, NF1, PALB2, PMS2, PTEN, RAD50, RAD51C, RAD51D, SMARCA4, STK11, and TP53.    04/28/2015 Definitive Surgery Right lumpectomy/SLNB [name]): Invasive ductal carcinoma grade 1, 0.9 cm, low-grade DCIS, margins negative, 0/6 sentinel nodes negative, ER 98%, PR 98%, HER-2 negative (ratio 1.29), Ki-67 6%,  Oncotype DX score 10 (7% ROR)   04/28/2015 Pathologic Stage Stage IA (T1b N0)    04/28/2015 Oncotype testing Score 10 (7% ROR)   06/15/2015 - 07/14/2015 Radiation Therapy Adjuvant RT [name]): Right breast 42.72 Gy over 21 fractions; right breast boost 10 G over 5 fractions   07/14/2015 -  Anti-estrogen oral therapy Tamoxifen 20 mg daily. Planned duration of therapy 5-10 years.    INTERVAL HISTORY:  April [name] presents to the Survivorship Clinic today for our initial meeting to review her survivorship care plan detailing her treatment course for breast cancer, as well as monitoring long-term side effects of that  treatment, education regarding health maintenance, screening, and overall wellness and health promotion.     Overall, April [name] reports feeling quite well since completing her radiation therapy approximately one month ago.  She reports that the skin overlying her breast is returning to normal and she is still experiencing fatigue.  She reports developing of some thickening throughout her right breast since completion of radiation, but denies any mass or nodularity.  She does report intermittent shooting pains along her right breast and in her right axilla, which are self limiting.  She also has some residual numbness along her right axilla without extension into her arm or shoulder.  April [name] has begun her tamoxifen and has minimal hot flashes, but they are bearable.  She does have mild insomnia but denies night sweats.  She has no headache, cough, or bone pain.  She has no headache.  April [name] does complain of "brain fog" and forgetfulness that she feels is improving.   REVIEW OF SYSTEMS:  General: Minimal hot flashes as above. Denies fever, chills, or night sweats.  HEENT: Denies visual changes, hearing loss, mouth sores, or trouble swallowing.  Cardiac: Denies palpitations, chest pain, and lower extremity edema.  Respiratory: Denies dyspnea on exertion.  GI: Denies abdominal pain, constipation, diarrhea, nausea, or vomiting.  GU: Denies dysuria, hematuria, vaginal bleeding, vaginal discharge, or vaginal dryness.  Musculoskeletal: Denies joint or bone pain.  Neuro: Denies recent falls or peripheral neuropathy. Skin: Denies rash, pruritis, or open wounds.  Breast: Denies any new nodularity, masses, tenderness, nipple changes, or nipple discharge.  Psych: Mild insomnia and "brain fog". Denies depression, anxiety, or memory  loss.   [name] 14-point review of systems was completed and was negative, except as noted above.   ONCOLOGY TREATMENT TEAM:  1. Surgeon:  Dr. [name] at [facility]   2. Medical Oncologist: Dr. Lindi Adie 3. Radiation Oncologist: Dr. [name]    PAST MEDICAL/SURGICAL HISTORY:  Past Medical History  Diagnosis Date  . Menorrhagia   . PCOS (polycystic ovarian syndrome)   . Hypertension   . Hypothyroidism   . 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 Family history of breast cancer   . Radiation 06/15/15-07/14/15    Right Breast ductal ca   Past Surgical History  Procedure Laterality Date  . Cesarean section  10-09-2000  &  1999  . Appendectomy  02-21-2009  . Tonsillectomy  age 89  . Achilles tendon repair Right 2002  . Hysteroscopy with novasure  11/ 2014  . Laparoscopic assisted vaginal hysterectomy N/[name] 07/27/2014    Procedure: LAPAROSCOPIC ASSISTED VAGINAL HYSTERECTOMY;  Surgeon: [name], MD;  Location: [facility];  Service: Gynecology;  Laterality: N/[name];  . Bilateral salpingectomy Bilateral 07/27/2014    Procedure: BILATERAL SALPINGECTOMY;  Surgeon: [name], MD;  Location: [facility];  Service: Gynecology;  Laterality: Bilateral;  . Breast, right needle core biospy Right 03/22/15  . Radioactive seed guided mastectomy with axillary sentinel lymph node biopsy Right 04/28/2015    Procedure: RADIOACTIVE SEED GUIDED RIGHT BREAST PARTIAL MASTECTOMY WITH RIGHT AXILLARY SENTINEL LYMPH NODE BIOPSY;  Surgeon: [name], MD;  Location: [facility];  Service: General;  Laterality: Right;     ALLERGIES:  Allergies  Allergen Reactions  . Ciprofloxacin Rash  . Sulfa Antibiotics Rash     CURRENT MEDICATIONS:  Current Outpatient Prescriptions on File Prior to Visit  Medication Sig Dispense Refill  . FeFum-FePoly-FA-B Cmp-C-Biot (INTEGRA PLUS PO) Take by mouth daily.    Marland Kitchen levothyroxine (SYNTHROID, LEVOTHROID) 50 MCG tablet Take 50 mcg by mouth daily before breakfast.    . metFORMIN (GLUCOPHAGE-XR) 500 MG 24 hr tablet Take 500 mg by mouth  daily with breakfast.    . tamoxifen (NOLVADEX) 20 MG tablet Take 1 tablet (20 mg total) by mouth daily. 90 tablet 3  . oxyCODONE-acetaminophen (PERCOCET) 10-325 MG per tablet Take 1 tablet by mouth every 6 (six) hours as needed for pain. (Patient not taking: Reported on 08/18/2015) 20 tablet 0   No current facility-administered medications on file prior to visit.     ONCOLOGIC FAMILY HISTORY:  Family History  Problem Relation Age of Onset  . Breast cancer Maternal Aunt 54  . Lung cancer Maternal Uncle     smoker  . Prostate cancer Paternal Uncle   . Colon cancer Maternal Grandfather   . ALS Paternal Grandfather   . Breast cancer Other     MGM's 2 sisters possibly had breast cancer     GENETIC COUNSELING/TESTING: OvaNext panel performed 04/06/2015 revealed variant of unknown significance at [facility]2 (c.2164G>[name]) otherwise negative at ATM, BARD1, BRCA1, BRCA2, BRIP1, CDH1, CHEK2, EPCAM, MLH1, MRE11A, MSH6, MUTYH, NBN, NF1, PALB2, PMS2, PTEN, RAD50, RAD51C, RAD51D, SMARCA4, STK11, and TP53.   SOCIAL HISTORY:  April [name] is married and lives with her family in [place], [place].  She has 2 children who are in high school.  Ms. Forrey is currently not working.  She denies any current or history of tobacco or illicit drug use. She uses alcohol socially.   PHYSICAL EXAMINATION:  Vital Signs:   Filed Vitals:   09/01/15 1332  BP: 132/92  Pulse: 76  Temp: 98.5 F (36.9 C)  Resp: 18   General: Well-nourished, well-appearing female in no acute distress.  She is unaccompanied in clinic today.   HEENT: Head is atraumatic and normocephalic.  Pupils equal and reactive to light and accomodation. Conjunctivae clear without exudate.  Sclerae anicteric. Oral mucosa is pink, moist, and intact without lesions.  Oropharynx is pink without lesions or erythema.  Lymph: No cervical, supraclavicular, infraclavicular, or axillary lymphadenopathy noted on palpation.  Cardiovascular: Regular rate  and rhythm without murmurs, rubs, or gallops. Respiratory: Clear to auscultation bilaterally. Chest expansion symmetric without accessory muscle use on inspiration or expiration.  GI: Abdomen soft and round. No tenderness to palpation. Bowel sounds normoactive in 4 quadrants. GU: Deferred.  Neuro: No focal deficits. Steady gait.  Psych: Mood and affect normal and appropriate for situation.  Extremities: No edema, cyanosis, or clubbing.  Skin: Warm and dry. No open lesions noted.   LABORATORY DATA:  None for this visit.  DIAGNOSTIC IMAGING:  None for this visit.     ASSESSMENT AND PLAN:   1. History of breast cancer: Stage IA invasive ductal carcinoma of the right breast, ER/PR positive, HER-2/neu negative, DCIS, status post lumpectomy followed by radiation therapy, now on adjuvant endocrine therapy with tamoxifen with planned duration of therapy 5-10 years.  April [name] is doing well without clinical evidence of recurrence at this time. She is experiencing mild side effects related to the tamoxifen, which she feels are bearable at this time.  We will continue to monitor her.  I think her brain fog / forgetfulness that she describes is related to her processing her diagnosis and will continue to improve. April [name] will report if it does not.  She will follow-up with her medical oncologist,  Dr. Lindi Adie, in December 2016 with history and physical exam per surveillance protocol.  She will continue her anti-estrogen therapy with tamoxifen as prescribed by  Dr. Lindi Adie. She was instructed to make Dr. Lindi Adie or myself aware if she begins to experience any worsening side effects of the medication and I could see her back in clinic to help manage those side effects, as needed. Side effects of Tamoxifen were again reviewed with her as well. [name] comprehensive survivorship care plan and treatment summary was reviewed with the patient today detailing her breast cancer diagnosis, treatment course, potential  late/long-term effects of treatment, appropriate follow-up care with recommendations for the future, and patient education resources.  [name] copy of this summary, along with [name] letter will be sent to the patient's primary care provider via in basket message after today's visit.  April [name] is welcome to return to the Survivorship Clinic in the future, as needed; no follow-up will be scheduled at this time.    2. Cancer screening:  Due to April [name]'s history and her age, she should receive screening for skin cancers and colon cancer (starting at age 95).  The information and recommendations are listed on the patient's comprehensive care plan/treatment summary and were reviewed in detail with the patient.    3. Health maintenance and wellness promotion: April [name] was encouraged to consume 5-7 servings of fruits and vegetables per day. We reviewed the "Nutrition Rainbow" handout, as well as discussing recommendations for health promotion including limiting intake of red meat and processed foods. She was also encouraged to engage in moderate to vigorous exercise for 30 minutes per day most days  of the week. She has just been cleared to resume walking after spraining her ankle three weeks ago.  We discussed the [employer] fitness program, which is designed for cancer survivors to help them become more physically fit after cancer treatments.  She was instructed to limit her alcohol consumption and continue to abstain from tobacco use.     4. Support services/counseling: It is not uncommon for this period of the patient's cancer care trajectory to be one of many emotions and stressors.  We discussed an opportunity for her to participate in the next session of [facility] ("Finding Your New Normal") support group series designed for patients after they have completed treatment.  April [name] was encouraged to take advantage of our many other support services programs, support groups, and/or counseling in coping with her new life  as [name] cancer survivor after completing anti-cancer treatment.  She was offered support today through active listening and expressive supportive counseling.  She was given information regarding our available services and encouraged to contact me with any questions or for help enrolling in any of our support group/programs.    [name] total of 50 minutes of face-to-face time was spent with this patient with greater than 50% of that time in counseling and care-coordination.   Sylvan Cheese, NP  Survivorship Program [facility] [phone]   Note: PRIMARY CARE PROVIDER Reginia Naas, [place] [phone]

## 2015-09-15 ENCOUNTER — Encounter: Payer: Commercial Managed Care - PPO | Admitting: Nurse Practitioner

## 2015-10-04 ENCOUNTER — Other Ambulatory Visit: Payer: Self-pay | Admitting: Gastroenterology

## 2015-10-13 ENCOUNTER — Telehealth: Payer: Self-pay | Admitting: Hematology and Oncology

## 2015-10-13 ENCOUNTER — Encounter: Payer: Self-pay | Admitting: Hematology and Oncology

## 2015-10-13 ENCOUNTER — Ambulatory Visit (HOSPITAL_BASED_OUTPATIENT_CLINIC_OR_DEPARTMENT_OTHER): Payer: Commercial Managed Care - PPO | Admitting: Hematology and Oncology

## 2015-10-13 VITALS — BP 128/81 | HR 92 | Temp 98.0°F | Resp 18 | Ht 66.0 in | Wt 166.6 lb

## 2015-10-13 DIAGNOSIS — N951 Menopausal and female climacteric states: Secondary | ICD-10-CM

## 2015-10-13 DIAGNOSIS — C50411 Malignant neoplasm of upper-outer quadrant of right female breast: Secondary | ICD-10-CM

## 2015-10-13 DIAGNOSIS — R4586 Emotional lability: Secondary | ICD-10-CM

## 2015-10-13 MED ORDER — VENLAFAXINE HCL ER 37.5 MG PO CP24: 37.5000 mg | ORAL_CAPSULE | Freq: Every day | ORAL | Status: DC

## 2015-10-13 NOTE — Progress Notes (Signed)
Patient Care Team: Carol Ada, MD as PCP - General (Family Medicine) Nicholas Lose, MD as Consulting Physician (Hematology and Oncology) Thea Silversmith, MD as Consulting Physician (Radiation Oncology) [name], MD as Consulting Physician (General Surgery) Sylvan Cheese, NP as Nurse Practitioner (Hematology and Oncology)  DIAGNOSIS: Breast cancer of upper-outer quadrant of right female breast [facility])   Staging form: Breast, AJCC 7th Edition     Clinical stage from 03/23/2015: Stage IA (T1c, N0, M0) - Unsigned     Pathologic stage from 04/28/2015: Stage IA (T1b, N0, cM0) - Unsigned   SUMMARY OF ONCOLOGIC HISTORY:   Breast cancer of upper-outer quadrant of right female breast ([place])   03/16/2015 Mammogram Right breast: mass   03/16/2015 Breast US Right breast: hypoechoic irregular shadowing mass at 12 o'clock, 4 cm from the nipple measuring 1.4 x 1.3 x 1.2 cm. Right axillary lymph nodes with smooth cortical thickness   03/22/2015 Initial Biopsy Right breast biopsy: Invasive ductal carcinoma with calcifications; ER+ (98%), PR+ (98%), Ki-67 6%, HER-2 negative (ratio 1.35)   04/04/2015 Breast MRI Right breast: 2.0 cm hematoma/ seroma within the upper central portion of the breast following recent stereotactic guided core biopsy   04/05/2015 Clinical Stage Stage IA: T1c N0   04/06/2015 Procedure Genetics: OvaNext panel revealed  VUTS at [place] (c.2164G>A) otherwise negative at ATM, BARD1, BRCA1, BRCA2, BRIP1, CDH1, CHEK2, EPCAM, MLH1, MRE11A, MSH6, MUTYH, NBN, NF1, PALB2, PMS2, PTEN, RAD50, RAD51C, RAD51D, SMARCA4, STK11, and TP53.    04/28/2015 Definitive Surgery Right lumpectomy/SLNB [name]): Invasive ductal carcinoma grade 1, 0.9 cm, low-grade DCIS, margins negative, 0/6 sentinel nodes negative, ER 98%, PR 98%, HER-2 negative (ratio 1.29), Ki-67 6%,  Oncotype DX score 10 (7% ROR)   04/28/2015 Pathologic Stage Stage IA (T1b N0)    04/28/2015 Oncotype testing Score 10 (7% ROR)   06/15/2015 - 07/14/2015 Radiation Therapy Adjuvant RT [name]): Right breast 42.72 Gy over 21 fractions; right breast boost 10 G over 5 fractions   07/14/2015 -  Anti-estrogen oral therapy Tamoxifen 20 mg daily. Planned duration of therapy 5-10 years.   09/01/2015 Survivorship Survivorship care plan completed and given to patient.    CHIEF COMPLIANT: follow-up on tamoxifen  INTERVAL HISTORY: [name] is a 44 year old with above-mentioned history of right breast cancer currently on adjuvant tamoxifen therapy and appears to be tolerating it moderately well. She does have occasional hot flashes as well as emotional outbursts. She tells me she feels anxious a lot of times. This could be because of having 2 teenagers at home but could also be because of tamoxifen therapy.  REVIEW OF SYSTEMS:   Constitutional: Denies fevers, chills or abnormal weight loss Eyes: Denies blurriness of vision Ears, nose, mouth, throat, and face: Denies mucositis or sore throat Respiratory: Denies cough, dyspnea or wheezes Cardiovascular: Denies palpitation, chest discomfort or lower extremity swelling Gastrointestinal:  Denies nausea, heartburn or change in bowel habits Skin: Denies abnormal skin rashes Lymphatics: Denies new lymphadenopathy or easy bruising Neurological:Denies numbness, tingling or new weaknesses Behavioral/Psych: emotional outbursts and anxiety Breast:  denies any pain or lumps or nodules in either breasts All other systems were reviewed with the patient and are negative.  I have reviewed the past medical history, past surgical history, social history and family history with the patient and they are unchanged from previous note.  ALLERGIES:  is allergic to ciprofloxacin and sulfa antibiotics.  MEDICATIONS:  Current Outpatient Prescriptions  Medication Sig Dispense Refill  . FeFum-FePoly-FA-B Cmp-C-Biot (INTEGRA PLUS PO)  Take by mouth daily.    Marland Kitchen levothyroxine (SYNTHROID, LEVOTHROID) 50 MCG  tablet Take 50 mcg by mouth daily before breakfast.    . lisinopril-hydrochlorothiazide (PRINZIDE,ZESTORETIC) 10-12.5 MG tablet Take 1 tablet by mouth daily.    . metFORMIN (GLUCOPHAGE-XR) 500 MG 24 hr tablet Take 500 mg by mouth daily with breakfast.    . oxyCODONE-acetaminophen (PERCOCET) 10-325 MG per tablet Take 1 tablet by mouth every 6 (six) hours as needed for pain. (Patient not taking: Reported on 08/18/2015) 20 tablet 0  . tamoxifen (NOLVADEX) 20 MG tablet Take 1 tablet (20 mg total) by mouth daily. 90 tablet 3   No current facility-administered medications for this visit.    PHYSICAL EXAMINATION: ECOG PERFORMANCE STATUS: 1 - Symptomatic but completely ambulatory  Filed Vitals:   10/13/15 0940  BP: 128/81  Pulse: 92  Temp: 98 F (36.7 C)  Resp: 18   Filed Weights   10/13/15 0940  Weight: 166 lb 9.6 oz (75.569 kg)    GENERAL:alert, no distress and comfortable SKIN: skin color, texture, turgor are normal, no rashes or significant lesions EYES: normal, Conjunctiva are pink and non-injected, sclera clear OROPHARYNX:no exudate, no erythema and lips, buccal mucosa, and tongue normal  NECK: supple, thyroid normal size, non-tender, without nodularity LYMPH:  no palpable lymphadenopathy in the cervical, axillary or inguinal LUNGS: clear to auscultation and percussion with normal breathing effort HEART: regular rate & rhythm and no murmurs and no lower extremity edema ABDOMEN:abdomen soft, non-tender and normal bowel sounds Musculoskeletal:no cyanosis of digits and no clubbing  NEURO: alert & oriented x 3 with fluent speech, no focal motor/sensory deficits  LABORATORY DATA:  I have reviewed the data as listed   Chemistry      Component Value Date/Time   NA 140 07/14/2015 0818   NA 138 04/26/2015 1405   K 3.1* 07/14/2015 0818   K 3.5 04/26/2015 1405   CL 101 04/26/2015 1405   CO2 24 07/14/2015 0818   CO2 29 04/26/2015 1405   BUN 12.9 07/14/2015 0818   BUN 8 04/26/2015  1405   CREATININE 0.8 07/14/2015 0818   CREATININE 0.67 04/26/2015 1405      Component Value Date/Time   CALCIUM 9.8 07/14/2015 0818   CALCIUM 9.6 04/26/2015 1405   ALKPHOS 60 07/14/2015 0818   ALKPHOS 67 04/26/2015 1405   AST 14 07/14/2015 0818   AST 24 04/26/2015 1405   ALT 20 07/14/2015 0818   ALT 29 04/26/2015 1405   BILITOT 0.76 07/14/2015 0818   BILITOT 0.9 04/26/2015 1405       Lab Results  Component Value Date   WBC 4.7 07/14/2015   HGB 14.8 07/14/2015   HCT 40.7 07/14/2015   MCV 86.6 07/14/2015   PLT 189 07/14/2015   NEUTROABS 3.3 07/14/2015   ASSESSMENT & PLAN:  Breast cancer of upper-outer quadrant of right female breast Right lumpectomy 04/28/2015: Invasive ductal carcinoma grade 1, 0.9 cm, low-grade DCIS, margins negative, 0/6 sentinel nodes negative, ER 98%, PR 98%, HER-2 negative, Ki-67 6%, T1 BN 0 stage IA, Oncotype DX score 10, 7% risk of recurrence, Adj radiation therapy 06/15/2015 to 07/14/15 Current Treatment: Tamoxifen 20 mg daily started 07/30/15  Tamoxifen toxicities: 1. Occasional hot flashes 2. Emotional outbursts  After discussing pros and cons we elected to start her on Effexor 37.5 mg once daily to help with emotional outbursts as well as hot flashes.  Return to clinic in 6 months for follow-up   No orders of the defined  types were placed in this encounter.   The patient has a good understanding of the overall plan. she agrees with it. she will call with any problems that may develop before the next visit here.   Rulon Eisenmenger, MD 10/13/2015

## 2015-10-13 NOTE — Telephone Encounter (Signed)
Appointments made and avs printed for pateint °

## 2015-10-13 NOTE — Addendum Note (Signed)
Addended by: Prentiss Bells on: 10/13/2015 05:21 PM   Modules accepted: Medications

## 2015-10-13 NOTE — Assessment & Plan Note (Signed)
Right lumpectomy 04/28/2015: Invasive ductal carcinoma grade 1, 0.9 cm, low-grade DCIS, margins negative, 0/6 sentinel nodes negative, ER 98%, PR 98%, HER-2 negative, Ki-67 6%, T1 BN 0 stage IA, Oncotype DX score 10, 7% risk of recurrence, Adj radiation therapy 06/15/2015 to 07/14/15 Current Treatment: Tamoxifen 20 mg daily started 07/30/15  Tamoxifen toxicities:  Return to clinic in 6 months for follow-up

## 2015-12-14 ENCOUNTER — Other Ambulatory Visit: Payer: Self-pay | Admitting: Hematology and Oncology

## 2015-12-14 DIAGNOSIS — Z9889 Other specified postprocedural states: Secondary | ICD-10-CM

## 2015-12-14 DIAGNOSIS — Z853 Personal history of malignant neoplasm of breast: Secondary | ICD-10-CM

## 2015-12-22 ENCOUNTER — Telehealth: Payer: Self-pay

## 2015-12-22 NOTE — Telephone Encounter (Signed)
Pt reports pain 1 week in her rib, right side, mid to lower rib towards her side, constant varies 3-5/10, nothing improves it, any pressure worsens it, unable to lie on it, movement aggravates it.  Appointment made for patient to see Dr. Lindi Adie.  Pt voiced understanding.

## 2015-12-25 NOTE — Assessment & Plan Note (Signed)
Right lumpectomy 04/28/2015: Invasive ductal carcinoma grade 1, 0.9 cm, low-grade DCIS, margins negative, 0/6 sentinel nodes negative, ER 98%, PR 98%, HER-2 negative, Ki-67 6%, T1 BN 0 stage IA, Oncotype DX score 10, 7% risk of recurrence, Adj radiation therapy 06/15/2015 to 07/14/15 Current Treatment: Tamoxifen 20 mg daily started 07/30/15  Tamoxifen toxicities: 1. Occasional hot flashes 2. Emotional outbursts  After discussing pros and cons we elected to start her on Effexor 37.5 mg once daily to help with emotional outbursts as well as hot flashes.  Return to clinic in 6 months for follow-up 

## 2015-12-26 ENCOUNTER — Ambulatory Visit (HOSPITAL_BASED_OUTPATIENT_CLINIC_OR_DEPARTMENT_OTHER): Payer: 59 | Admitting: Hematology and Oncology

## 2015-12-26 ENCOUNTER — Ambulatory Visit (HOSPITAL_COMMUNITY)
Admission: RE | Admit: 2015-12-26 | Discharge: 2015-12-26 | Disposition: A | Discharge status: Home / Self Care | Payer: 59 | Source: Ambulatory Visit | Attending: Hematology and Oncology | Admitting: Hematology and Oncology

## 2015-12-26 ENCOUNTER — Telehealth: Payer: Self-pay | Admitting: Hematology and Oncology

## 2015-12-26 ENCOUNTER — Encounter: Payer: Self-pay | Admitting: Hematology and Oncology

## 2015-12-26 VITALS — BP 135/89 | HR 98 | Temp 98.1°F | Resp 18 | Ht 66.0 in | Wt 164.7 lb

## 2015-12-26 DIAGNOSIS — C50411 Malignant neoplasm of upper-outer quadrant of right female breast: Secondary | ICD-10-CM | POA: Diagnosis not present

## 2015-12-26 DIAGNOSIS — R1011 Right upper quadrant pain: Secondary | ICD-10-CM | POA: Diagnosis not present

## 2015-12-26 DIAGNOSIS — R918 Other nonspecific abnormal finding of lung field: Secondary | ICD-10-CM | POA: Diagnosis not present

## 2015-12-26 NOTE — Telephone Encounter (Signed)
Patient sent for cxr and given avs report and appointments for August. June f/u cxd due to patient seen today (2/27) and per 2/27 pof patient to f/u in 6 mos - patient aware. [facility] will call re abd [place] - patient aware.

## 2015-12-26 NOTE — Progress Notes (Signed)
Patient Care Team: Carol Ada, MD as PCP - General (Family Medicine) Nicholas Lose, MD as Consulting Physician (Hematology and Oncology) Thea Silversmith, MD as Consulting Physician (Radiation Oncology) [name], MD as Consulting Physician (General Surgery) Sylvan Cheese, NP as Nurse Practitioner (Hematology and Oncology)  DIAGNOSIS: Breast cancer of upper-outer quadrant of right female breast [facility])   Staging form: Breast, AJCC 7th Edition     Clinical stage from 03/23/2015: Stage IA (T1c, N0, M0) - Unsigned     Pathologic stage from 04/28/2015: Stage IA (T1b, N0, cM0) - Unsigned   SUMMARY OF ONCOLOGIC HISTORY:   Breast cancer of upper-outer quadrant of right female breast ([place])   03/16/2015 Mammogram Right breast: mass   03/16/2015 Breast US Right breast: hypoechoic irregular shadowing mass at 12 o'clock, 4 cm from the nipple measuring 1.4 x 1.3 x 1.2 cm. Right axillary lymph nodes with smooth cortical thickness   03/22/2015 Initial Biopsy Right breast biopsy: Invasive ductal carcinoma with calcifications; ER+ (98%), PR+ (98%), Ki-67 6%, HER-2 negative (ratio 1.35)   04/04/2015 Breast MRI Right breast: 2.0 cm hematoma/ seroma within the upper central portion of the breast following recent stereotactic guided core biopsy   04/05/2015 Clinical Stage Stage IA: T1c N0   04/06/2015 Procedure Genetics: OvaNext panel revealed  VUTS at [place] (c.2164G>A) otherwise negative at ATM, BARD1, BRCA1, BRCA2, BRIP1, CDH1, CHEK2, EPCAM, MLH1, MRE11A, MSH6, MUTYH, NBN, NF1, PALB2, PMS2, PTEN, RAD50, RAD51C, RAD51D, SMARCA4, STK11, and TP53.    04/28/2015 Definitive Surgery Right lumpectomy/[facility]NB [name]): Invasive ductal carcinoma grade 1, 0.9 cm, low-grade DCIS, margins negative, 0/6 sentinel nodes negative, ER 98%, PR 98%, HER-2 negative (ratio 1.29), Ki-67 6%,  Oncotype DX score 10 (7% ROR)   04/28/2015 Pathologic Stage Stage IA (T1b N0)    04/28/2015 Oncotype testing Score 10 (7% ROR)   06/15/2015 - 07/14/2015 Radiation Therapy Adjuvant RT [name]): Right breast 42.72 Gy over 21 fractions; right breast boost 10 G over 5 fractions   07/14/2015 -  Anti-estrogen oral therapy Tamoxifen 20 mg daily. Planned duration of therapy 5-10 years.   09/01/2015 Survivorship Survivorship care plan completed and given to patient.    CHIEF COMPLIANT: Right upper quadrant abdominal pain and right rib pain  INTERVAL HISTORY: BRIGGITTE [name] is a 45 year old with above-mentioned history of right breast cancer who had profound emotional outbursts to tamoxifen therapy. She was started on Effexor and had a remarkable improvement in her quality of life. She is able to handle the stress of life very well. She no longer has emotional outbursts. She is complaining of a right upper quadrant abdominal pain for the past one half weeks. She cannot lie on the right side. There is no skin rashes. She gets the pain occasionally when she eats food as well.  REVIEW OF SYSTEMS:   Constitutional: Denies fevers, chills or abnormal weight loss Eyes: Denies blurriness of vision Ears, nose, mouth, throat, and face: Denies mucositis or sore throat Respiratory: Denies cough, dyspnea or wheezes Cardiovascular: Denies palpitation, chest discomfort Gastrointestinal:  Right upper quadrant abdominal pain Skin: Denies abnormal skin rashes Lymphatics: Denies new lymphadenopathy or easy bruising Neurological:Denies numbness, tingling or new weaknesses Behavioral/Psych: Mood is stable, no new changes  Extremities: No lower extremity edema Breast:  denies any pain or lumps or nodules in either breasts All other systems were reviewed with the patient and are negative.  I have reviewed the past medical history, past surgical history, social history and family history with the patient and  they are unchanged from previous note.  ALLERGIES:  is allergic to ciprofloxacin and sulfa antibiotics.  MEDICATIONS:  Current Outpatient  Prescriptions  Medication Sig Dispense Refill  . ALPRAZolam (XANAX) 0.5 MG tablet TK 1 T PO Q 6 H PRA  1  . levothyroxine (SYNTHROID, LEVOTHROID) 50 MCG tablet Take 50 mcg by mouth daily before breakfast.    . lisinopril-hydrochlorothiazide (PRINZIDE,ZESTORETIC) 10-12.5 MG tablet Take 1 tablet by mouth daily.    . metFORMIN (GLUCOPHAGE-XR) 500 MG 24 hr tablet Take 500 mg by mouth daily with breakfast.    . tamoxifen (NOLVADEX) 20 MG tablet Take 1 tablet (20 mg total) by mouth daily. 90 tablet 3  . venlafaxine XR (EFFEXOR-XR) 37.5 MG 24 hr capsule Take 1 capsule (37.5 mg total) by mouth daily with breakfast. 30 capsule 5   No current facility-administered medications for this visit.    PHYSICAL EXAMINATION: ECOG PERFORMANCE STATUS: 1 - Symptomatic but completely ambulatory  Filed Vitals:   12/26/15 0851  BP: 135/89  Pulse: 98  Temp: 98.1 F (36.7 C)  Resp: 18   Filed Weights   12/26/15 0851  Weight: 164 lb 11.2 oz (74.707 kg)    GENERAL:alert, no distress and comfortable SKIN: skin color, texture, turgor are normal, no rashes or significant lesions EYES: normal, Conjunctiva are pink and non-injected, sclera clear OROPHARYNX:no exudate, no erythema and lips, buccal mucosa, and tongue normal  NECK: supple, thyroid normal size, non-tender, without nodularity LYMPH:  no palpable lymphadenopathy in the cervical, axillary or inguinal LUNGS: clear to auscultation and percussion with normal breathing effort HEART: regular rate & rhythm and no murmurs and no lower extremity edema ABDOMEN:Right upper quadrant tenderness to palpation, positive Murphy's sign. MUSCULOSKELETAL:no cyanosis of digits and no clubbing  NEURO: alert & oriented x 3 with fluent speech, no focal motor/sensory deficits EXTREMITIES: No lower extremity edema  LABORATORY DATA:  I have reviewed the data as listed   Chemistry      Component Value Date/Time   NA 140 07/14/2015 0818   NA 138 04/26/2015 1405   K 3.1*  07/14/2015 0818   K 3.5 04/26/2015 1405   CL 101 04/26/2015 1405   CO2 24 07/14/2015 0818   CO2 29 04/26/2015 1405   BUN 12.9 07/14/2015 0818   BUN 8 04/26/2015 1405   CREATININE 0.8 07/14/2015 0818   CREATININE 0.67 04/26/2015 1405      Component Value Date/Time   CALCIUM 9.8 07/14/2015 0818   CALCIUM 9.6 04/26/2015 1405   ALKPHOS 60 07/14/2015 0818   ALKPHOS 67 04/26/2015 1405   AST 14 07/14/2015 0818   AST 24 04/26/2015 1405   ALT 20 07/14/2015 0818   ALT 29 04/26/2015 1405   BILITOT 0.76 07/14/2015 0818   BILITOT 0.9 04/26/2015 1405       Lab Results  Component Value Date   WBC 4.7 07/14/2015   HGB 14.8 07/14/2015   HCT 40.7 07/14/2015   MCV 86.6 07/14/2015   PLT 189 07/14/2015   NEUTROABS 3.3 07/14/2015   ASSESSMENT & PLAN:  Breast cancer of upper-outer quadrant of right female breast Right lumpectomy 04/28/2015: Invasive ductal carcinoma grade 1, 0.9 cm, low-grade DCIS, margins negative, 0/6 sentinel nodes negative, ER 98%, PR 98%, HER-2 negative, Ki-67 6%, T1 BN 0 stage IA, Oncotype DX score 10, 7% risk of recurrence, Adj radiation therapy 06/15/2015 to 07/14/15 Current Treatment: Tamoxifen 20 mg daily started 07/30/15  Tamoxifen toxicities: 1. Occasional hot flashes 2. Emotional outbursts: Significantly improved after she  was put on Effexor. She is able to handle life as he always used to.  Right upper quadrant abdominal pain: Slight tenderness to palpation below the right coastal margin as well as on the ribs. I would like to get a chest x-ray and an ultrasound of the gallbladder. I will call her with the results of these tests.  Return to clinic in 6 months for follow-up   No orders of the defined types were placed in this encounter.   The patient has a good understanding of the overall plan. she agrees with it. she will call with any problems that may develop before the next visit here.   Rulon Eisenmenger, MD 12/26/2015

## 2015-12-27 ENCOUNTER — Telehealth: Payer: Self-pay | Admitting: Hematology and Oncology

## 2015-12-27 ENCOUNTER — Other Ambulatory Visit: Payer: Self-pay | Admitting: Hematology and Oncology

## 2015-12-27 DIAGNOSIS — C50411 Malignant neoplasm of upper-outer quadrant of right female breast: Secondary | ICD-10-CM

## 2015-12-27 NOTE — Telephone Encounter (Signed)
per pof to sch CT within one week-[facility] are aware and will get precert and call pt

## 2015-12-29 ENCOUNTER — Encounter: Payer: Self-pay | Admitting: Adult Health

## 2015-12-29 NOTE — Progress Notes (Signed)
A birthday card was mailed to the patient today on behalf of the Survivorship Program at [facility].   Gretchen Dawson, NP Survivorship Program [facility] [phone]  

## 2015-12-30 ENCOUNTER — Encounter (HOSPITAL_COMMUNITY): Payer: Self-pay

## 2015-12-30 ENCOUNTER — Ambulatory Visit (HOSPITAL_COMMUNITY)
Admission: RE | Admit: 2015-12-30 | Discharge: 2015-12-30 | Disposition: A | Discharge status: Home / Self Care | Payer: 59 | Source: Ambulatory Visit | Attending: Hematology and Oncology | Admitting: Hematology and Oncology

## 2015-12-30 ENCOUNTER — Other Ambulatory Visit: Payer: Self-pay | Admitting: Hematology and Oncology

## 2015-12-30 DIAGNOSIS — Z9889 Other specified postprocedural states: Secondary | ICD-10-CM | POA: Insufficient documentation

## 2015-12-30 DIAGNOSIS — R1011 Right upper quadrant pain: Secondary | ICD-10-CM | POA: Diagnosis present

## 2015-12-30 DIAGNOSIS — K760 Fatty (change of) liver, not elsewhere classified: Secondary | ICD-10-CM | POA: Diagnosis not present

## 2015-12-30 DIAGNOSIS — C50411 Malignant neoplasm of upper-outer quadrant of right female breast: Secondary | ICD-10-CM

## 2015-12-30 DIAGNOSIS — R918 Other nonspecific abnormal finding of lung field: Secondary | ICD-10-CM | POA: Insufficient documentation

## 2015-12-30 DIAGNOSIS — J700 Acute pulmonary manifestations due to radiation: Secondary | ICD-10-CM | POA: Diagnosis not present

## 2015-12-30 MED ORDER — IOHEXOL 300 MG/ML  SOLN
75.0000 mL | Freq: Once | INTRAMUSCULAR | Status: AC | PRN
Start: 2015-12-30 — End: 2015-12-30
  Administered 2015-12-30: 75 mL via INTRAVENOUS

## 2016-03-09 ENCOUNTER — Ambulatory Visit
Admission: RE | Admit: 2016-03-09 | Discharge: 2016-03-09 | Disposition: A | Discharge status: Home / Self Care | Payer: 59 | Source: Ambulatory Visit | Attending: Hematology and Oncology | Admitting: Hematology and Oncology

## 2016-03-09 ENCOUNTER — Other Ambulatory Visit: Payer: Self-pay | Admitting: Hematology and Oncology

## 2016-03-09 DIAGNOSIS — N6320 Unspecified lump in the left breast, unspecified quadrant: Secondary | ICD-10-CM

## 2016-03-09 DIAGNOSIS — Z9889 Other specified postprocedural states: Secondary | ICD-10-CM

## 2016-03-09 DIAGNOSIS — Z853 Personal history of malignant neoplasm of breast: Secondary | ICD-10-CM

## 2016-04-06 ENCOUNTER — Telehealth: Payer: Self-pay

## 2016-04-06 NOTE — Telephone Encounter (Signed)
Received VM from pt who states she is experiencing a pain in her shoulder blade X 2 weeks.  Pt states she thought she pulled a muscle but it has not gotten any better.  Pt said she was worked up for a similar pain in February that was all negative.  Pt states she was told it was probably a result of recent radiation.  Lindi Adie, MD informed of situation and recommended pt make an appointment to be seen by her PCP.  Called pt back and explained this to her.  Pt denies any other symptoms except for a dry cough.  Pt verbalized understanding to contact PCP for an appointment and to notify our office should any further symptoms arise or if she develops any questions or concerns.

## 2016-04-09 ENCOUNTER — Ambulatory Visit
Admission: RE | Admit: 2016-04-09 | Discharge: 2016-04-09 | Disposition: A | Discharge status: Home / Self Care | Payer: 59 | Source: Ambulatory Visit | Attending: Family Medicine | Admitting: Family Medicine

## 2016-04-09 ENCOUNTER — Other Ambulatory Visit: Payer: Self-pay | Admitting: Family Medicine

## 2016-04-09 DIAGNOSIS — M549 Dorsalgia, unspecified: Secondary | ICD-10-CM

## 2016-04-10 ENCOUNTER — Other Ambulatory Visit: Payer: Self-pay | Admitting: Hematology and Oncology

## 2016-04-10 DIAGNOSIS — C50411 Malignant neoplasm of upper-outer quadrant of right female breast: Secondary | ICD-10-CM

## 2016-04-12 ENCOUNTER — Ambulatory Visit: Payer: Commercial Managed Care - PPO | Admitting: Hematology and Oncology

## 2016-06-11 ENCOUNTER — Other Ambulatory Visit: Payer: Self-pay | Admitting: Orthopedic Surgery

## 2016-06-18 ENCOUNTER — Encounter (HOSPITAL_BASED_OUTPATIENT_CLINIC_OR_DEPARTMENT_OTHER): Payer: Self-pay | Admitting: *Deleted

## 2016-06-18 NOTE — Progress Notes (Signed)
Bring all medications. Coming tomorrow for BMET and EKG.

## 2016-06-19 ENCOUNTER — Other Ambulatory Visit: Payer: Self-pay

## 2016-06-19 ENCOUNTER — Encounter (HOSPITAL_BASED_OUTPATIENT_CLINIC_OR_DEPARTMENT_OTHER)
Admission: RE | Admit: 2016-06-19 | Discharge: 2016-06-19 | Disposition: A | Discharge status: Home / Self Care | Payer: 59 | Source: Ambulatory Visit | Attending: Orthopedic Surgery | Admitting: Orthopedic Surgery

## 2016-06-19 DIAGNOSIS — E282 Polycystic ovarian syndrome: Secondary | ICD-10-CM | POA: Diagnosis not present

## 2016-06-19 DIAGNOSIS — Z79899 Other long term (current) drug therapy: Secondary | ICD-10-CM | POA: Diagnosis not present

## 2016-06-19 DIAGNOSIS — M7661 Achilles tendinitis, right leg: Secondary | ICD-10-CM | POA: Diagnosis present

## 2016-06-19 DIAGNOSIS — Z923 Personal history of irradiation: Secondary | ICD-10-CM | POA: Diagnosis not present

## 2016-06-19 DIAGNOSIS — M6701 Short Achilles tendon (acquired), right ankle: Secondary | ICD-10-CM | POA: Diagnosis not present

## 2016-06-19 DIAGNOSIS — M7731 Calcaneal spur, right foot: Secondary | ICD-10-CM | POA: Diagnosis not present

## 2016-06-19 DIAGNOSIS — Z79810 Long term (current) use of selective estrogen receptor modulators (SERMs): Secondary | ICD-10-CM | POA: Diagnosis not present

## 2016-06-19 DIAGNOSIS — Z7984 Long term (current) use of oral hypoglycemic drugs: Secondary | ICD-10-CM | POA: Diagnosis not present

## 2016-06-19 DIAGNOSIS — E039 Hypothyroidism, unspecified: Secondary | ICD-10-CM | POA: Diagnosis not present

## 2016-06-19 DIAGNOSIS — Z853 Personal history of malignant neoplasm of breast: Secondary | ICD-10-CM | POA: Diagnosis not present

## 2016-06-19 DIAGNOSIS — I10 Essential (primary) hypertension: Secondary | ICD-10-CM | POA: Diagnosis not present

## 2016-06-19 LAB — BASIC METABOLIC PANEL
ANION GAP: 10 (ref 5–15)
BUN: 11 mg/dL (ref 6–20)
CALCIUM: 10 mg/dL (ref 8.9–10.3)
CO2: 22 mmol/L (ref 22–32)
CREATININE: 0.84 mg/dL (ref 0.44–1.00)
Chloride: 103 mmol/L (ref 101–111)
GFR calc Af Amer: >60 mL/min (ref >60)
GFR calc non Af Amer: >60 mL/min (ref >60)
GLUCOSE: 157 mg/dL — AB (ref 65–99)
POTASSIUM: 3.3 mmol/L — AB (ref 3.5–5.1)
Sodium: 135 mmol/L (ref 135–145)

## 2016-06-21 ENCOUNTER — Ambulatory Visit (HOSPITAL_BASED_OUTPATIENT_CLINIC_OR_DEPARTMENT_OTHER)
Admission: RE | Admit: 2016-06-21 | Discharge: 2016-06-21 | Disposition: A | Discharge status: Home / Self Care | Payer: 59 | Source: Ambulatory Visit | Attending: Orthopedic Surgery | Admitting: Orthopedic Surgery

## 2016-06-21 ENCOUNTER — Ambulatory Visit (HOSPITAL_BASED_OUTPATIENT_CLINIC_OR_DEPARTMENT_OTHER): Payer: 59 | Admitting: Anesthesiology

## 2016-06-21 ENCOUNTER — Encounter (HOSPITAL_BASED_OUTPATIENT_CLINIC_OR_DEPARTMENT_OTHER)
Admission: RE | Disposition: A | Discharge status: Home / Self Care | Payer: Self-pay | Source: Ambulatory Visit | Attending: Orthopedic Surgery

## 2016-06-21 ENCOUNTER — Encounter (HOSPITAL_BASED_OUTPATIENT_CLINIC_OR_DEPARTMENT_OTHER): Payer: Self-pay

## 2016-06-21 DIAGNOSIS — E039 Hypothyroidism, unspecified: Secondary | ICD-10-CM | POA: Insufficient documentation

## 2016-06-21 DIAGNOSIS — E282 Polycystic ovarian syndrome: Secondary | ICD-10-CM | POA: Insufficient documentation

## 2016-06-21 DIAGNOSIS — M7661 Achilles tendinitis, right leg: Secondary | ICD-10-CM | POA: Diagnosis not present

## 2016-06-21 DIAGNOSIS — Z79810 Long term (current) use of selective estrogen receptor modulators (SERMs): Secondary | ICD-10-CM | POA: Insufficient documentation

## 2016-06-21 DIAGNOSIS — Z7984 Long term (current) use of oral hypoglycemic drugs: Secondary | ICD-10-CM | POA: Insufficient documentation

## 2016-06-21 DIAGNOSIS — M7731 Calcaneal spur, right foot: Secondary | ICD-10-CM | POA: Insufficient documentation

## 2016-06-21 DIAGNOSIS — Z853 Personal history of malignant neoplasm of breast: Secondary | ICD-10-CM | POA: Insufficient documentation

## 2016-06-21 DIAGNOSIS — Z923 Personal history of irradiation: Secondary | ICD-10-CM | POA: Insufficient documentation

## 2016-06-21 DIAGNOSIS — I10 Essential (primary) hypertension: Secondary | ICD-10-CM | POA: Insufficient documentation

## 2016-06-21 DIAGNOSIS — Z79899 Other long term (current) drug therapy: Secondary | ICD-10-CM | POA: Insufficient documentation

## 2016-06-21 DIAGNOSIS — M6701 Short Achilles tendon (acquired), right ankle: Secondary | ICD-10-CM | POA: Insufficient documentation

## 2016-06-21 HISTORY — PX: EXCISION HAGLUND'S DEFORMITY WITH ACHILLES TENDON REPAIR: SHX5627

## 2016-06-21 HISTORY — PX: GASTROC RECESSION EXTREMITY: SHX6262

## 2016-06-21 SURGERY — RECESSION, TENDON, GASTROCNEMIUS
Anesthesia: General | Site: Leg Lower | Laterality: Right

## 2016-06-21 MED ORDER — LACTATED RINGERS IV SOLN: INTRAVENOUS | Status: DC

## 2016-06-21 MED ORDER — LIDOCAINE HCL (CARDIAC) 20 MG/ML IV SOLN
INTRAVENOUS | Status: DC | PRN
  Administered 2016-06-21: 100 mg via INTRAVENOUS

## 2016-06-21 MED ORDER — DEXAMETHASONE SODIUM PHOSPHATE 4 MG/ML IJ SOLN
INTRAMUSCULAR | Status: DC | PRN
  Administered 2016-06-21: 10 mg via INTRAVENOUS

## 2016-06-21 MED ORDER — CEFAZOLIN SODIUM-DEXTROSE 2-4 GM/100ML-% IV SOLN
2.0000 g | INTRAVENOUS | Status: AC
  Administered 2016-06-21: 2 g via INTRAVENOUS

## 2016-06-21 MED ORDER — LACTATED RINGERS IV SOLN
INTRAVENOUS | Status: DC
  Administered 2016-06-21 (×2): via INTRAVENOUS

## 2016-06-21 MED ORDER — PROPOFOL 10 MG/ML IV BOLUS
INTRAVENOUS | Status: AC
  Filled 2016-06-21: qty 20

## 2016-06-21 MED ORDER — FENTANYL CITRATE (PF) 100 MCG/2ML IJ SOLN
50.0000 ug | INTRAMUSCULAR | Status: DC | PRN
  Administered 2016-06-21 (×2): 100 ug via INTRAVENOUS

## 2016-06-21 MED ORDER — SODIUM CHLORIDE 0.9 % IV SOLN: INTRAVENOUS | Status: DC

## 2016-06-21 MED ORDER — OXYCODONE HCL 5 MG PO TABS: 5.0000 mg | ORAL_TABLET | ORAL | 0 refills | Status: DC | PRN

## 2016-06-21 MED ORDER — 0.9 % SODIUM CHLORIDE (POUR BTL) OPTIME
TOPICAL | Status: DC | PRN
  Administered 2016-06-21: 300 mL

## 2016-06-21 MED ORDER — LIDOCAINE 2% (20 MG/ML) 5 ML SYRINGE
INTRAMUSCULAR | Status: AC
  Filled 2016-06-21: qty 5

## 2016-06-21 MED ORDER — CHLORHEXIDINE GLUCONATE 4 % EX LIQD: 60.0000 mL | Freq: Once | CUTANEOUS | Status: DC

## 2016-06-21 MED ORDER — ONDANSETRON HCL 4 MG/2ML IJ SOLN
INTRAMUSCULAR | Status: DC | PRN
  Administered 2016-06-21: 4 mg via INTRAVENOUS

## 2016-06-21 MED ORDER — BUPIVACAINE-EPINEPHRINE (PF) 0.5% -1:200000 IJ SOLN
INTRAMUSCULAR | Status: DC | PRN
  Administered 2016-06-21: 30 mL via PERINEURAL

## 2016-06-21 MED ORDER — SUCCINYLCHOLINE CHLORIDE 20 MG/ML IJ SOLN
INTRAMUSCULAR | Status: DC | PRN
  Administered 2016-06-21: 100 mg via INTRAVENOUS

## 2016-06-21 MED ORDER — MIDAZOLAM HCL 2 MG/2ML IJ SOLN
INTRAMUSCULAR | Status: AC
  Filled 2016-06-21: qty 2

## 2016-06-21 MED ORDER — MIDAZOLAM HCL 2 MG/2ML IJ SOLN
1.0000 mg | INTRAMUSCULAR | Status: DC | PRN
  Administered 2016-06-21: 2 mg via INTRAVENOUS

## 2016-06-21 MED ORDER — SCOPOLAMINE 1 MG/3DAYS TD PT72
1.0000 | MEDICATED_PATCH | Freq: Once | TRANSDERMAL | Status: DC | PRN
  Administered 2016-06-21: 1.5 mg via TRANSDERMAL

## 2016-06-21 MED ORDER — FENTANYL CITRATE (PF) 100 MCG/2ML IJ SOLN: 25.0000 ug | INTRAMUSCULAR | Status: DC | PRN

## 2016-06-21 MED ORDER — METOCLOPRAMIDE HCL 5 MG/ML IJ SOLN: 10.0000 mg | Freq: Once | INTRAMUSCULAR | Status: DC | PRN

## 2016-06-21 MED ORDER — SUCCINYLCHOLINE CHLORIDE 200 MG/10ML IV SOSY
PREFILLED_SYRINGE | INTRAVENOUS | Status: AC
  Filled 2016-06-21: qty 10

## 2016-06-21 MED ORDER — FENTANYL CITRATE (PF) 100 MCG/2ML IJ SOLN
INTRAMUSCULAR | Status: AC
  Filled 2016-06-21: qty 2

## 2016-06-21 MED ORDER — GLYCOPYRROLATE 0.2 MG/ML IJ SOLN: 0.2000 mg | Freq: Once | INTRAMUSCULAR | Status: DC | PRN

## 2016-06-21 MED ORDER — MEPERIDINE HCL 25 MG/ML IJ SOLN: 6.2500 mg | INTRAMUSCULAR | Status: DC | PRN

## 2016-06-21 MED ORDER — CEFAZOLIN SODIUM-DEXTROSE 2-4 GM/100ML-% IV SOLN
INTRAVENOUS | Status: AC
  Filled 2016-06-21: qty 100

## 2016-06-21 MED ORDER — PROPOFOL 10 MG/ML IV BOLUS
INTRAVENOUS | Status: DC | PRN
  Administered 2016-06-21: 180 mg via INTRAVENOUS

## 2016-06-21 SURGICAL SUPPLY — 85 items
ANCH SUT KNTLS SLF PNCH MTL (Anchor) ×6 IMPLANT
ANCHOR PEEK ALL THREAD (Anchor) ×4 IMPLANT
BANDAGE ESMARK 6X9 LF (GAUZE/BANDAGES/DRESSINGS) ×3 IMPLANT
BLADE AVERAGE 25X9 (BLADE) IMPLANT
BLADE MICRO SAGITTAL (BLADE) ×2 IMPLANT
BLADE SURG 15 STRL LF DISP TIS (BLADE) ×7 IMPLANT
BLADE SURG 15 STRL SS (BLADE) ×12
BNDG CMPR 9X6 STRL LF SNTH (GAUZE/BANDAGES/DRESSINGS) ×3
BNDG COHESIVE 4X5 TAN STRL (GAUZE/BANDAGES/DRESSINGS) ×4 IMPLANT
BNDG COHESIVE 6X5 TAN STRL LF (GAUZE/BANDAGES/DRESSINGS) ×4 IMPLANT
BNDG ESMARK 6X9 LF (GAUZE/BANDAGES/DRESSINGS) ×4
BOOT STEPPER DURA LG (SOFTGOODS) IMPLANT
BOOT STEPPER DURA MED (SOFTGOODS) IMPLANT
CANISTER SUCT 1200ML W/VALVE (MISCELLANEOUS) ×4 IMPLANT
CHLORAPREP W/TINT 26ML (MISCELLANEOUS) ×4 IMPLANT
COVER BACK TABLE 60X90IN (DRAPES) ×4 IMPLANT
CUFF TOURNIQUET SINGLE 34IN LL (TOURNIQUET CUFF) ×4 IMPLANT
DRAPE EXTREMITY T 121X128X90 (DRAPE) ×4 IMPLANT
DRAPE OEC MINIVIEW 54X84 (DRAPES) IMPLANT
DRAPE U-SHAPE 47X51 STRL (DRAPES) ×4 IMPLANT
DRSG MEPITEL 4X7.2 (GAUZE/BANDAGES/DRESSINGS) ×4 IMPLANT
DRSG PAD ABDOMINAL 8X10 ST (GAUZE/BANDAGES/DRESSINGS) ×8 IMPLANT
ELECT REM PT RETURN 9FT ADLT (ELECTROSURGICAL) ×4
ELECTRODE REM PT RTRN 9FT ADLT (ELECTROSURGICAL) ×3 IMPLANT
GAUZE SPONGE 4X4 12PLY STRL (GAUZE/BANDAGES/DRESSINGS) ×4 IMPLANT
GLOVE BIO SURGEON STRL SZ8 (GLOVE) ×4 IMPLANT
GLOVE BIOGEL PI IND STRL 7.0 (GLOVE) ×2 IMPLANT
GLOVE BIOGEL PI IND STRL 8 (GLOVE) ×5 IMPLANT
GLOVE BIOGEL PI INDICATOR 7.0 (GLOVE) ×2
GLOVE BIOGEL PI INDICATOR 8 (GLOVE) ×1
GLOVE ECLIPSE 6.5 STRL STRAW (GLOVE) ×2 IMPLANT
GLOVE ECLIPSE 7.5 STRL STRAW (GLOVE) ×2 IMPLANT
GLOVE EXAM NITRILE MD LF STRL (GLOVE) IMPLANT
GOWN STRL REUS W/ TWL LRG LVL3 (GOWN DISPOSABLE) ×3 IMPLANT
GOWN STRL REUS W/ TWL XL LVL3 (GOWN DISPOSABLE) ×5 IMPLANT
GOWN STRL REUS W/TWL LRG LVL3 (GOWN DISPOSABLE) ×4
GOWN STRL REUS W/TWL XL LVL3 (GOWN DISPOSABLE) ×4
IMPL ANCHOR PEEK 4.5MM (Anchor) ×2 IMPLANT
IMPLANT ANCHOR PEEK 4.5MM (Anchor) ×8 IMPLANT
KIT BIO-TENODESIS 3X8 DISP (MISCELLANEOUS)
KIT INSRT BABSR STRL DISP BTN (MISCELLANEOUS) IMPLANT
NDL SAFETY ECLIPSE 18X1.5 (NEEDLE) IMPLANT
NDL SUT 6 .5 CRC .975X.05 MAYO (NEEDLE) ×1 IMPLANT
NEEDLE HYPO 18GX1.5 SHARP (NEEDLE)
NEEDLE HYPO 22GX1.5 SAFETY (NEEDLE) IMPLANT
NEEDLE MAYO TAPER (NEEDLE) ×4
NS IRRIG 1000ML POUR BTL (IV SOLUTION) ×4 IMPLANT
PACK BASIN DAY SURGERY FS (CUSTOM PROCEDURE TRAY) ×4 IMPLANT
PAD CAST 4YDX4 CTTN HI CHSV (CAST SUPPLIES) ×3 IMPLANT
PADDING CAST ABS 4INX4YD NS (CAST SUPPLIES)
PADDING CAST ABS COTTON 4X4 ST (CAST SUPPLIES) IMPLANT
PADDING CAST COTTON 4X4 STRL (CAST SUPPLIES) ×4
PADDING CAST COTTON 6X4 STRL (CAST SUPPLIES) ×4 IMPLANT
PENCIL BUTTON HOLSTER BLD 10FT (ELECTRODE) ×4 IMPLANT
SANITIZER HAND PURELL 535ML FO (MISCELLANEOUS) ×4 IMPLANT
SHEET MEDIUM DRAPE 40X70 STRL (DRAPES) ×4 IMPLANT
SLEEVE SCD COMPRESS KNEE MED (MISCELLANEOUS) ×4 IMPLANT
SPLINT FAST PLASTER 5X30 (CAST SUPPLIES) ×20
SPLINT PLASTER CAST FAST 5X30 (CAST SUPPLIES) ×60 IMPLANT
SPONGE LAP 18X18 X RAY DECT (DISPOSABLE) ×4 IMPLANT
STAPLER VISISTAT 35W (STAPLE) IMPLANT
STOCKINETTE 6  STRL (DRAPES) ×1
STOCKINETTE 6 STRL (DRAPES) ×3 IMPLANT
SUCTION FRAZIER HANDLE 10FR (MISCELLANEOUS) ×1
SUCTION TUBE FRAZIER 10FR DISP (MISCELLANEOUS) ×1 IMPLANT
SUT 2 FIBERLOOP 20 STRT BLUE (SUTURE)
SUT ETHIBOND 2 OS 4 DA (SUTURE) IMPLANT
SUT ETHIBOND 3-0 V-5 (SUTURE) IMPLANT
SUT ETHILON 3 0 PS 1 (SUTURE) ×4 IMPLANT
SUT FIBERWIRE #2 38 T-5 BLUE (SUTURE)
SUT MNCRL AB 3-0 PS2 18 (SUTURE) ×4 IMPLANT
SUT VIC AB 0 CT1 27 (SUTURE)
SUT VIC AB 0 CT1 27XBRD ANBCTR (SUTURE) IMPLANT
SUT VIC AB 0 SH 27 (SUTURE) IMPLANT
SUT VIC AB 1 CT1 27 (SUTURE)
SUT VIC AB 1 CT1 27XBRD ANBCTR (SUTURE) IMPLANT
SUT VIC AB 2-0 SH 27 (SUTURE) ×4
SUT VIC AB 2-0 SH 27XBRD (SUTURE) ×1 IMPLANT
SUTURE 2 FIBERLOOP 20 STRT BLU (SUTURE) IMPLANT
SUTURE FIBERWR #2 38 T-5 BLUE (SUTURE) IMPLANT
SYR BULB 3OZ (MISCELLANEOUS) ×4 IMPLANT
SYR CONTROL 10ML LL (SYRINGE) IMPLANT
TOWEL OR 17X24 6PK STRL BLUE (TOWEL DISPOSABLE) ×8 IMPLANT
TUBE CONNECTING 20X1/4 (TUBING) ×4 IMPLANT
UNDERPAD 30X30 (UNDERPADS AND DIAPERS) ×4 IMPLANT

## 2016-06-21 NOTE — Anesthesia Procedure Notes (Signed)
Procedure Name: Intubation Date/Time: 06/21/2016 1:35 PM Performed by: Maryella Shivers Pre-anesthesia Checklist: Patient identified, Emergency Drugs available, Suction available and Patient being monitored Patient Re-evaluated:Patient Re-evaluated prior to inductionOxygen Delivery Method: Circle system utilized Preoxygenation: Pre-oxygenation with 100% oxygen Intubation Type: IV induction Ventilation: Mask ventilation without difficulty Laryngoscope Size: Mac and 3 Grade View: Grade I Tube type: Oral Tube size: 7.0 mm Number of attempts: 1 Airway Equipment and Method: Stylet and Oral airway Placement Confirmation: ETT inserted through vocal cords under direct vision,  positive ETCO2 and breath sounds checked- equal and bilateral Secured at: 20 cm Tube secured with: Tape Dental Injury: Teeth and Oropharynx as per pre-operative assessment

## 2016-06-21 NOTE — Brief Op Note (Signed)
06/21/2016  2:43 PM  PATIENT:  [name]  45 y.o. female  PRE-OPERATIVE DIAGNOSIS:  RIGHT ACHILLES TENdonitis,HAGLUND DEFORMITY,TIGHT HEEL CORD  POST-OPERATIVE DIAGNOSIS:  RIGHT ACHILLES TENOLYTIS,HAGLUND DEFORMITY,TIGHT HEEL CORD  Procedure(s): 1.  Right GASTROC RECESSION (separate incision) 2.  RIGHT ACHILLES TENDON DEBRIDEMENT AND RECONSTRUCTION 3.  EXCISION of right HAGLUND DEFORMITY  SURGEON:  Wylene Simmer, MD  ASSISTANT: n/a  ANESTHESIA:   General, regional  EBL:  minimal   TOURNIQUET:   Total Tourniquet Time Documented: Thigh (Right) - 50 minutes Total: Thigh (Right) - 50 minutes  COMPLICATIONS:  None apparent  DISPOSITION:  Extubated, awake and stable to recovery.  DICTATION ID:  [id number]

## 2016-06-21 NOTE — Anesthesia Postprocedure Evaluation (Signed)
Anesthesia Post Note  Patient: LEZA ASHDOWN  Procedure(s) Performed: Procedure(s) (LRB): GASTROC RECESSION (Right) RIGHT ACHILLES TENDON DEBRIDEMENT AND RECONSTRUCTION; EXCISION HAGLUND DEFORMITY (Right)  Patient location during evaluation: PACU Anesthesia Type: General and Regional Level of consciousness: awake and alert Pain management: pain level controlled Vital Signs Assessment: post-procedure vital signs reviewed and stable Respiratory status: spontaneous breathing, nonlabored ventilation, respiratory function stable and patient connected to nasal cannula oxygen Cardiovascular status: blood pressure returned to baseline and stable Postop Assessment: no signs of nausea or vomiting Anesthetic complications: no    Last Vitals:  Vitals:   06/21/16 1515 06/21/16 1530  BP: (!) 137/100 (!) 138/95  Pulse: 81 65  Resp: 19 15  Temp:      Last Pain:  Vitals:   06/21/16 1515  TempSrc:   PainSc: 0-No pain                 Montez Hageman

## 2016-06-21 NOTE — Anesthesia Procedure Notes (Signed)
Anesthesia Regional Block:  Popliteal block  Pre-Anesthetic Checklist: ,, timeout performed, Correct Patient, Correct Site, Correct Laterality, Correct Procedure, Correct Position, site marked, Risks and benefits discussed,  Surgical consent,  Pre-op evaluation,  At surgeon's request and post-op pain management  Laterality: Right and Lower  Prep: Maximum Sterile Barrier Precautions used, chloraprep       Needles:  Injection technique: Single-shot  Needle Type: Echogenic Stimulator Needle     Needle Length: 10cm 10 cm Needle Gauge: 21 G    Additional Needles:  Procedures: ultrasound guided (picture in chart) and nerve stimulator Popliteal block Narrative:  Injection made incrementally with aspirations every 5 mL.  Performed by: Personally   Additional Notes: Patient tolerated the procedure well without complications

## 2016-06-21 NOTE — H&P (Signed)
[name] is an 45 y.o. female.   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  She also has a tight heelcord and a Haglund deformity.  She has failed non op treatment to date including activity modification, shoewear modification, physical therapy and oral anti inflammatories.  She presents today for surgical treatment.  Past Medical History:  Diagnosis Date  . Family history of breast cancer   . Hypertension   . Hypothyroidism   . Invasive ductal carcinoma of right breast in female [facility]) 03/22/2015   er+/PR+/her2-  . Menorrhagia   . PCOS (polycystic ovarian syndrome)   . Radiation 06/15/15-07/14/15   Right Breast ductal ca    Past Surgical History:  Procedure Laterality Date  . ACHILLES TENDON REPAIR Right 2002  . APPENDECTOMY  02-21-2009  . BILATERAL SALPINGECTOMY Bilateral 07/27/2014   Procedure: BILATERAL SALPINGECTOMY;  Surgeon: [name], MD;  Location[facility];  Service: Gynecology;  Laterality: Bilateral;  . Breast, Right Needle Core Biospy Right 03/22/15  . CESAREAN SECTION  10-09-2000  &  1999  . HYSTEROSCOPY WITH NOVASURE  11/ 2014  . LAPAROSCOPIC ASSISTED VAGINAL HYSTERECTOMY N/A 07/27/2014   Procedure: LAPAROSCOPIC ASSISTED VAGINAL HYSTERECTOMY;  Surgeon: [name], MD;  Location: [place];  Service: Gynecology;  Laterality: N/A;  . RADIOACTIVE SEED GUIDED MASTECTOMY WITH AXILLARY SENTINEL LYMPH NODE BIOPSY Right 04/28/2015   Procedure: RADIOACTIVE SEED GUIDED RIGHT BREAST PARTIAL MASTECTOMY WITH RIGHT AXILLARY SENTINEL LYMPH NODE BIOPSY;  Surgeon: [name], MD;  Location: [place];  Service: General;  Laterality: Right;  . TONSILLECTOMY  age 30    Family History  Problem Relation Age of Onset  . Breast cancer Maternal Aunt 41  . Lung cancer Maternal Uncle     smoker  . Prostate cancer Paternal Uncle   . Colon  cancer Maternal Grandfather   . ALS Paternal Grandfather   . Breast cancer Other     MGM's 2 sisters possibly had breast cancer   Social History:  reports that she has never smoked. She has never used smokeless tobacco. She reports that she drinks about 1.8 oz of alcohol per week . She reports that she does not use drugs.  Allergies:  Allergies  Allergen Reactions  . Ciprofloxacin Rash  . Sulfa Antibiotics Nausea And Vomiting    Fever     Medications Prior to Admission  Medication Sig Dispense Refill  . ALPRAZolam (XANAX) 0.5 MG tablet TK 1 T PO Q 6 H PRA  1  . levothyroxine (SYNTHROID, LEVOTHROID) 50 MCG tablet Take 50 mcg by mouth daily before breakfast.    . lisinopril-hydrochlorothiazide (PRINZIDE,ZESTORETIC) 10-12.5 MG tablet Take 1 tablet by mouth daily.    . tamoxifen (NOLVADEX) 20 MG tablet Take 1 tablet (20 mg total) by mouth daily. 90 tablet 3  . venlafaxine XR (EFFEXOR-XR) 37.5 MG 24 hr capsule TAKE 1 CAPSULE(37.5 MG) BY MOUTH DAILY WITH BREAKFAST 90 capsule 3  . metFORMIN (GLUCOPHAGE-XR) 500 MG 24 hr tablet Take 500 mg by mouth as needed. Takes for PCOS      Results for orders placed or performed during the hospital encounter of 06/21/16 (from the past 48 hour(s))  Basic metabolic panel     Status: Abnormal   Collection Time: 06/19/16  3:00 PM  Result Value Ref Range   Sodium 135 135 - 145 mmol/L   Potassium 3.3 (L) 3.5 -  5.1 mmol/L   Chloride 103 101 - 111 mmol/L   CO2 22 22 - 32 mmol/L   Glucose, Bld 157 (H) 65 - 99 mg/dL   BUN 11 6 - 20 mg/dL   Creatinine, Ser 0.84 0.44 - 1.00 mg/dL   Calcium 10.0 8.9 - 10.3 mg/dL   GFR calc non Af Amer >60 >60 mL/min   GFR calc Af Amer >60 >60 mL/min    Comment: (NOTE) The eGFR has been calculated using the CKD EPI equation. This calculation has not been validated in all clinical situations. eGFR's persistently <60 mL/min signify possible Chronic Kidney Disease.    Anion gap 10 5 - 15   No results found.  ROS  No  recent f/c/n/v/wt loss.  Blood pressure 123/87, pulse 80, temperature 98.2 F (36.8 C), temperature source Oral, resp. rate 16, height 5' 6" (1.676 m), weight 75 kg (165 lb 6 oz), last menstrual period 07/14/2014, SpO2 100 %. Physical Exam  wn wd woman in nad.  A and o x 4.  Mood and affect normal.  EOMI.  Res punlabored.  R foot with healthy skin.  No lymphadenopathy.  Sens to LT intact.  5/5 strength in PF.  Tight heelcord.  TTP at insertion of achilles on posterior calcaneus.  Assessment/Plan R achilles insertional tendonopathy, tight heelcord and haglund deformity.  To OR for gastroc recession, excision of Haglund deformity and achilles tendon debridement and reconstruction.  The risks and benefits of the alternative treatment options have been discussed in detail.  The patient wishes to proceed with surgery and specifically understands risks of bleeding, infection, nerve damage, blood clots, need for additional surgery, amputation and death.   HEWITT, JOHN, MD 06/21/2016, 1:02 PM   

## 2016-06-21 NOTE — Progress Notes (Signed)
Assisted Dr. Marcell Barlow with right, ultrasound guided, popliteal block. Side rails up, monitors on throughout procedure. See vital signs in flow sheet. Tolerated Procedure well.

## 2016-06-21 NOTE — Transfer of Care (Signed)
Immediate Anesthesia Transfer of Care Note  Patient: EBONEE OLEARY  Procedure(s) Performed: Procedure(s): GASTROC RECESSION (Right) RIGHT ACHILLES TENDON DEBRIDEMENT AND RECONSTRUCTION; EXCISION HAGLUND DEFORMITY (Right)  Patient Location: PACU  Anesthesia Type:GA combined with regional for post-op pain  Level of Consciousness: sedated  Airway & Oxygen Therapy: Patient Spontanous Breathing and Patient connected to face mask oxygen  Post-op Assessment: Report given to RN and Post -op Vital signs reviewed and stable  Post vital signs: Reviewed and stable  Last Vitals:  Vitals:   06/21/16 1152 06/21/16 1449  BP: 123/87 (!) 142/94  Pulse: 80 (!) 107  Resp: 16 (!) 44  Temp: 36.8 C     Last Pain:  Vitals:   06/21/16 1152  TempSrc: Oral         Complications: No apparent anesthesia complications

## 2016-06-21 NOTE — Discharge Instructions (Addendum)
Post Anesthesia Home Care Instructions  Activity: Get plenty of rest for the remainder of the day. A responsible adult should stay with you for 24 hours following the procedure.  For the next 24 hours, DO NOT: -Drive a car -Paediatric nurse -Drink alcoholic beverages -Take any medication unless instructed by your physician -Make any legal decisions or sign important papers.  Meals: Start with liquid foods such as gelatin or soup. Progress to regular foods as tolerated. Avoid greasy, spicy, heavy foods. If nausea and/or vomiting occur, drink only clear liquids until the nausea and/or vomiting subsides. Call your physician if vomiting continues.  Special Instructions/Symptoms: Your throat may feel dry or sore from the anesthesia or the breathing tube placed in your throat during surgery. If this causes discomfort, gargle with warm salt water. The discomfort should disappear within 24 hours.  If you had a scopolamine patch placed behind your ear for the management of post- operative nausea and/or vomiting:  1. The medication in the patch is effective for 72 hours, after which it should be removed.  Wrap patch in a tissue and discard in the trash. Wash hands thoroughly with soap and water. 2. You may remove the patch earlier than 72 hours if you experience unpleasant side effects which may include dry mouth, dizziness or visual disturbances. 3. Avoid touching the patch. Wash your hands with soap and water after contact with the patch.     Regional Anesthesia Blocks  1. Numbness or the inability to move the "blocked" extremity may last from 3-48 hours after placement. The length of time depends on the medication injected and your individual response to the medication. If the numbness is not going away after 48 hours, call your surgeon.  2. The extremity that is blocked will need to be protected until the numbness is gone and the  Strength has returned. Because you cannot feel it, you will  need to take extra care to avoid injury. Because it may be weak, you may have difficulty moving it or using it. You may not know what position it is in without looking at it while the block is in effect.  3. For blocks in the legs and feet, returning to weight bearing and walking needs to be done carefully. You will need to wait until the numbness is entirely gone and the strength has returned. You should be able to move your leg and foot normally before you try and bear weight or walk. You will need someone to be with you when you first try to ensure you do not fall and possibly risk injury.  4. Bruising and tenderness at the needle site are common side effects and will resolve in a few days.  5. Persistent numbness or new problems with movement should be communicated to the surgeon or the [facility] [phone] [place] [phone])     .April Simmer, MD  [place]  Please read the following information regarding your care after surgery.  Medications  You only need a prescription for the narcotic pain medicine (ex. oxycodone, Percocet, Norco).  All of the other medicines listed below are available over the counter. X acetominophen (Tylenol) 650 mg every 4-6 hours as you need for minor pain X oxycodone as prescribed for moderate to severe pain X Aleve two pills twice a day for 3 days post op.   Narcotic pain medicine (ex. oxycodone, Percocet, Vicodin) will cause constipation.  To prevent this problem, take the following medicines while  you are taking any pain medicine. X docusate sodium (Colace) 100 mg twice a day X senna (Senokot) 2 tablets twice a day  X To help prevent blood clots, take a baby aspirin (81 mg) twice a day for two weeks after surgery.  You should also get up every hour while you are awake to move around.    Weight Bearing ? Bear weight when you are able on your operated leg or foot. ? Bear weight only on the heel of your  operated foot in the post-op shoe. X Do not bear any weight on the operated leg or foot.  Cast / Splint / Dressing X Keep your splint or cast clean and dry.  Dont put anything (coat hanger, pencil, etc) down inside of it.  If it gets damp, use a hair dryer on the cool setting to dry it.  If it gets soaked, call the office to schedule an appointment for a cast change. ? Remove your dressing 3 days after surgery and cover the incisions with dry dressings.    After your dressing, cast or splint is removed; you may shower, but do not soak or scrub the wound.  Allow the water to run over it, and then gently pat it dry.  Swelling It is normal for you to have swelling where you had surgery.  To reduce swelling and pain, keep your toes above your nose for at least 3 days after surgery.  It may be necessary to keep your foot or leg elevated for several weeks.  If it hurts, it should be elevated.  Follow Up Call my office at [phone] when you are discharged from the hospital or surgery center to schedule an appointment to be seen two weeks after surgery.  Call my office at [phone] if you develop a fever >101.5 F, nausea, vomiting, bleeding from the surgical site or severe pain.

## 2016-06-21 NOTE — Anesthesia Preprocedure Evaluation (Signed)
Anesthesia Evaluation  Patient identified by MRN, date of birth, ID band Patient awake    Reviewed: Allergy & Precautions, NPO status , Patient's Chart, lab work & pertinent test results  History of Anesthesia Complications (+) PONV  Airway Mallampati: II  TM Distance: >3 FB Neck ROM: Full    Dental no notable dental hx. (+) Caps   Pulmonary neg pulmonary ROS,    Pulmonary exam normal breath sounds clear to auscultation       Cardiovascular hypertension, Pt. on medications Normal cardiovascular exam Rhythm:Regular Rate:Normal     Neuro/Psych negative neurological ROS  negative psych ROS   GI/Hepatic negative GI ROS, Neg liver ROS,   Endo/Other  negative endocrine ROSHypothyroidism   Renal/GU negative Renal ROS  negative genitourinary   Musculoskeletal negative musculoskeletal ROS (+)   Abdominal   Peds negative pediatric ROS (+)  Hematology negative hematology ROS (+)   Anesthesia Other Findings   Reproductive/Obstetrics negative OB ROS                             Anesthesia Physical Anesthesia Plan  ASA: II  Anesthesia Plan: General   Post-op Pain Management: GA combined w/ Regional for post-op pain   Induction: Intravenous  Airway Management Planned:   Additional Equipment:   Intra-op Plan:   Post-operative Plan: Extubation in OR  Informed Consent: I have reviewed the patients History and Physical, chart, labs and discussed the procedure including the risks, benefits and alternatives for the proposed anesthesia with the patient or authorized representative who has indicated his/her understanding and acceptance.   Dental advisory given  Plan Discussed with: CRNA  Anesthesia Plan Comments: (Popliteal block)        Anesthesia Quick Evaluation

## 2016-06-22 ENCOUNTER — Encounter (HOSPITAL_BASED_OUTPATIENT_CLINIC_OR_DEPARTMENT_OTHER): Payer: Self-pay | Admitting: Orthopedic Surgery

## 2016-06-22 NOTE — Op Note (Signed)
NAME:  [name], ACHTER NO.:  [id number]  MEDICAL RECORD NO.:  [id number]  LOCATION:                                 FACILITY:  PHYSICIAN:  Wylene Simmer, MD        DATE OF BIRTH:  Oct 17, 1971  DATE OF PROCEDURE:  06/21/2016 DATE OF DISCHARGE:                              OPERATIVE REPORT   PREOPERATIVE DIAGNOSES: 1. 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 3. Tight right heel cord.  POSTOPERATIVE DIAGNOSES: 1. 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 3. Tight right heel cord.  PROCEDURES: 1. Right gastrocnemius recession through a separate incision. 2. Right Achilles tendon debridement and reconstruction. 3. Excision of right calcaneus Haglund deformity.  SURGEON:  Wylene Simmer, MD.  ANESTHESIA:  General, regional.  ESTIMATED BLOOD LOSS:  Minimal.  TOURNIQUET TIME:  50 minutes at 250 mmHg.  COMPLICATIONS:  None apparent.  DISPOSITION:  Extubated, awake, and stable to recovery.  INDICATIONS FOR PROCEDURE:  The patient is a 45 year old woman who has a long history of right heel pain.  She has failed nonoperative treatment to date including activity modification, oral anti-inflammatories, shoe wear modification, and physical therapy.  She presents now for operative treatment of this painful condition.  She understands the risks and benefits, the alternative treatment options, and elects surgical treatment.  She specifically understands risks of bleeding, infection, nerve damage, blood clots, need for additional surgery, continued pain, amputation, and death.  PROCEDURE IN DETAIL:  After preoperative consent was obtained, the correct operative site was identified, the patient was brought to the operating room supine on a stretcher.  General anesthesia was induced. Preoperative antibiotics were administered.  Surgical time-out was taken.  The right lower extremity was  exsanguinated and the thigh tourniquet inflated to 250 mmHg.  The patient was then turned to the operating table in the prone position with all bony prominences padded well.  The right lower extremity was then prepped and draped in standard sterile fashion.  A longitudinal incision was made over the calf.  Sharp dissection was carried down through skin and subcutaneous tissue.  Care was taken to protect the sural nerve and lesser saphenous vein.  The gastrocnemius tendon was identified.  It was divided under direct vision in its entirety.  Plantaris tendon was also divided in its entirety. Wound was irrigated and closed with Monocryl and nylon.  Attention was then turned to the posterior aspect of the heel where a longitudinal incision was made.  Sharp dissection was carried down through skin and subcutaneous tissue, and down through the paratenon. Paratenon was elevated medially and laterally exposing the tendon.  The tendon was then split longitudinally and elevated from its insertion on the calcaneus medially and laterally.  The bone suture was identified from previous surgical debridement from a medial approach.  This suture was removed as much as possible.  Wound was irrigated copiously.  The Achilles tendon was then debrided of all degenerative tissue.  This represented less than 15% of the cross-sectional area of the tendon. The insertional enthesophyte and Haglund deformity were then excised with an oscillating saw.  The cut surface of  bone was smoothed with a rasp.  Two 5.5 mm [facility] anchors were inserted.  Sutures were then passed into the tendon repairing the tendon down to the cut surface of bone with horizontal mattress sutures.  The suture ends were then crossed creating an hourglass pattern of suture.  The distal row of suture was then repaired to the calcaneus using 4.5 mm [facility] knotless anchors.  The ankle could then be dorsiflexed to neutral with no  evident gapping at the tendon repair site.  Wound was again irrigated.  The split in the tendon was repaired with Vicryl subcutaneous tissues and paratenon were repaired with inverted simple sutures of 3-0 Monocryl.  Skin incision was closed with horizontal mattress sutures of 3-0 nylon.  Sterile dressings were applied followed by a well-padded short-leg splint.  Tourniquet was released after application of the dressings at 50 minutes.  The patient was awakened from anesthesia and transported to the recovery room in stable condition.  FOLLOWUP PLAN:  The patient will be nonweightbearing on the right lower extremity.  She will follow up with me in the office in 2 weeks for suture removal and conversion to a CAM walker boot with 2 heel lifts.     Wylene Simmer, MD     JH/MEDQ  D:  06/21/2016  T:  06/22/2016  Job:  [id number]

## 2016-06-25 ENCOUNTER — Ambulatory Visit: Payer: Commercial Managed Care - PPO | Admitting: Hematology and Oncology

## 2016-06-25 NOTE — Assessment & Plan Note (Deleted)
Right lumpectomy 04/28/2015: Invasive ductal carcinoma grade 1, 0.9 cm, low-grade DCIS, margins negative, 0/6 sentinel nodes negative, ER 98%, PR 98%, HER-2 negative, Ki-67 6%, T1 BN 0 stage IA, Oncotype DX score 10, 7% risk of recurrence, Adj radiation therapy 06/15/2015 to 07/14/15  Current Treatment: Tamoxifen 20 mg daily started 07/30/15  Tamoxifen toxicities: 1. Occasional hot flashes 2. Emotional outbursts: Significantly improved after she was put on Effexor. She is able to handle life as he always used to.  Right upper quadrant abdominal pain:  CT chest and ultrasound of the abdomen March 2017: did not identify any clear-cut abnormalities other than fatty liver.  Return to clinic in 6 months for follow-up

## 2016-07-11 ENCOUNTER — Other Ambulatory Visit: Payer: Self-pay | Admitting: Hematology and Oncology

## 2016-07-11 DIAGNOSIS — C50411 Malignant neoplasm of upper-outer quadrant of right female breast: Secondary | ICD-10-CM

## 2016-08-07 ENCOUNTER — Ambulatory Visit (HOSPITAL_BASED_OUTPATIENT_CLINIC_OR_DEPARTMENT_OTHER): Payer: 59 | Admitting: Hematology and Oncology

## 2016-08-07 ENCOUNTER — Encounter: Payer: Self-pay | Admitting: Hematology and Oncology

## 2016-08-07 DIAGNOSIS — R1011 Right upper quadrant pain: Secondary | ICD-10-CM | POA: Diagnosis not present

## 2016-08-07 DIAGNOSIS — C50411 Malignant neoplasm of upper-outer quadrant of right female breast: Secondary | ICD-10-CM | POA: Diagnosis not present

## 2016-08-07 DIAGNOSIS — R61 Generalized hyperhidrosis: Secondary | ICD-10-CM

## 2016-08-07 DIAGNOSIS — Z170 Estrogen receptor positive status [ER+]: Secondary | ICD-10-CM

## 2016-08-07 DIAGNOSIS — N951 Menopausal and female climacteric states: Secondary | ICD-10-CM | POA: Diagnosis not present

## 2016-08-07 NOTE — Progress Notes (Signed)
Patient Care Team: Carol Ada, MD as PCP - General (Family Medicine) Nicholas Lose, MD as Consulting Physician (Hematology and Oncology) Thea Silversmith, MD as Consulting Physician (Radiation Oncology) [name], MD as Consulting Physician (General Surgery) Sylvan Cheese, NP as Nurse Practitioner (Hematology and Oncology)  DIAGNOSIS: Breast cancer of upper-outer quadrant of right female breast [facility])   Staging form: Breast, AJCC 7th Edition   - Clinical stage from 03/23/2015: Stage IA (T1c, N0, M0) - Unsigned   - Pathologic stage from 04/28/2015: Stage IA (T1b, N0, cM0) - Unsigned  SUMMARY OF ONCOLOGIC HISTORY:   Breast cancer of upper-outer quadrant of right female breast ([place])   03/16/2015 Mammogram    Right breast: mass      03/16/2015 Breast US    Right breast: hypoechoic irregular shadowing mass at 12 o'clock, 4 cm from the nipple measuring 1.4 x 1.3 x 1.2 cm. Right axillary lymph nodes with smooth cortical thickness      03/22/2015 Initial Biopsy    Right breast biopsy: Invasive ductal carcinoma with calcifications; ER+ (98%), PR+ (98%), Ki-67 6%, HER-2 negative (ratio 1.35)      04/04/2015 Breast MRI    Right breast: 2.0 cm hematoma/ seroma within the upper central portion of the breast following recent stereotactic guided core biopsy      04/05/2015 Clinical Stage    Stage IA: T1c N0      04/06/2015 Procedure    Genetics: OvaNext panel revealed  VUTS at [place] (c.2164G>A) otherwise negative at ATM, BARD1, BRCA1, BRCA2, BRIP1, CDH1, CHEK2, EPCAM, MLH1, MRE11A, MSH6, MUTYH, NBN, NF1, PALB2, PMS2, PTEN, RAD50, RAD51C, RAD51D, SMARCA4, STK11, and TP53.       04/28/2015 Definitive Surgery    Right lumpectomy/[facility] [name]): Invasive ductal carcinoma grade 1, 0.9 cm, low-grade DCIS, margins negative, 0/6 sentinel nodes negative, ER 98%, PR 98%, HER-2 negative (ratio 1.29), Ki-67 6%,  Oncotype DX score 10 (7% ROR)      04/28/2015 Pathologic Stage    Stage IA  (T1b N0)       04/28/2015 Oncotype testing    Score 10 (7% ROR)      06/15/2015 - 07/14/2015 Radiation Therapy    Adjuvant RT [name]): Right breast 42.72 Gy over 21 fractions; right breast boost 10 G over 5 fractions      07/14/2015 -  Anti-estrogen oral therapy    Tamoxifen 20 mg daily. Planned duration of therapy 5-10 years.      09/01/2015 Survivorship    Survivorship care plan completed and given to patient.       CHIEF COMPLIANT: follow-up on tamoxifen therapy  INTERVAL HISTORY: [name] is a 45 year old with above-mentioned history of right breast cancer treated with lumpectomy radiation and is currently on tamoxifen therapy since September 2016. She is tolerating tamoxifen fairly well. Initially she had lot of emotional problems which have settled down on Effexor. She has recently noticed some hot flashes and night sweats. She also underwent right Achilles tendon surgery for sprain and is in a boot.  REVIEW OF SYSTEMS:   Constitutional: Denies fevers, chills or abnormal weight loss Eyes: Denies blurriness of vision Ears, nose, mouth, throat, and face: Denies mucositis or sore throat Respiratory: Denies cough, dyspnea or wheezes Cardiovascular: Denies palpitation, chest discomfort Gastrointestinal:  Denies nausea, heartburn or change in bowel habits Skin: Denies abnormal skin rashes Lymphatics: Denies new lymphadenopathy or easy bruising Neurological:Denies numbness, tingling or new weaknesses Behavioral/Psych: Mood is stable, no new changes  Extremities: No lower  extremity edema Breast:  denies any pain or lumps or nodules in either breasts All other systems were reviewed with the patient and are negative.  I have reviewed the past medical history, past surgical history, social history and family history with the patient and they are unchanged from previous note.  ALLERGIES:  is allergic to ciprofloxacin and sulfa antibiotics.  MEDICATIONS:  Current Outpatient  Prescriptions  Medication Sig Dispense Refill  . ALPRAZolam (XANAX) 0.5 MG tablet TK 1 T PO Q 6 H PRA  1  . levothyroxine (SYNTHROID, LEVOTHROID) 50 MCG tablet Take 50 mcg by mouth daily before breakfast.    . lisinopril-hydrochlorothiazide (PRINZIDE,ZESTORETIC) 10-12.5 MG tablet Take 1 tablet by mouth daily.    . metFORMIN (GLUCOPHAGE-XR) 500 MG 24 hr tablet Take 500 mg by mouth as needed. Takes for PCOS    . oxyCODONE (ROXICODONE) 5 MG immediate release tablet Take 1-2 tablets (5-10 mg total) by mouth every 4 (four) hours as needed for moderate pain or severe pain. 20 tablet 0  . tamoxifen (NOLVADEX) 20 MG tablet TAKE 1 TABLET(20 MG) BY MOUTH DAILY 90 tablet 0  . venlafaxine XR (EFFEXOR-XR) 37.5 MG 24 hr capsule TAKE 1 CAPSULE(37.5 MG) BY MOUTH DAILY WITH BREAKFAST 90 capsule 3   No current facility-administered medications for this visit.     PHYSICAL EXAMINATION: ECOG PERFORMANCE STATUS: 1 - Symptomatic but completely ambulatory  Vitals:   08/07/16 1501  BP: 124/88  Pulse: 98  Resp: 18  Temp: 98.2 F (36.8 C)   Filed Weights   08/07/16 1501  Weight: 168 lb (76.2 kg)    GENERAL:alert, no distress and comfortable SKIN: skin color, texture, turgor are normal, no rashes or significant lesions EYES: normal, Conjunctiva are pink and non-injected, sclera clear OROPHARYNX:no exudate, no erythema and lips, buccal mucosa, and tongue normal  NECK: supple, thyroid normal size, non-tender, without nodularity LYMPH:  no palpable lymphadenopathy in the cervical, axillary or inguinal LUNGS: clear to auscultation and percussion with normal breathing effort HEART: regular rate & rhythm and no murmurs and no lower extremity edema ABDOMEN:abdomen soft, non-tender and normal bowel sounds MUSCULOSKELETAL:no cyanosis of digits and no clubbing  NEURO: alert & oriented x 3 with fluent speech, no focal motor/sensory deficits EXTREMITIES: No lower extremity edema BREAST: No palpable masses or  nodules in either right or left breasts. Postsurgical changes.No palpable axillary supraclavicular or infraclavicular adenopathy no breast tenderness or nipple discharge. (exam performed in the presence of a chaperone)  LABORATORY DATA:  I have reviewed the data as listed   Chemistry      Component Value Date/Time   NA 135 06/19/2016 1500   NA 140 07/14/2015 0818   K 3.3 (L) 06/19/2016 1500   K 3.1 (L) 07/14/2015 0818   CL 103 06/19/2016 1500   CO2 22 06/19/2016 1500   CO2 24 07/14/2015 0818   BUN 11 06/19/2016 1500   BUN 12.9 07/14/2015 0818   CREATININE 0.84 06/19/2016 1500   CREATININE 0.8 07/14/2015 0818      Component Value Date/Time   CALCIUM 10.0 06/19/2016 1500   CALCIUM 9.8 07/14/2015 0818   ALKPHOS 60 07/14/2015 0818   AST 14 07/14/2015 0818   ALT 20 07/14/2015 0818   BILITOT 0.76 07/14/2015 0818       Lab Results  Component Value Date   WBC 4.7 07/14/2015   HGB 14.8 07/14/2015   HCT 40.7 07/14/2015   MCV 86.6 07/14/2015   PLT 189 07/14/2015   NEUTROABS 3.3  07/14/2015     ASSESSMENT & PLAN:  Breast cancer of upper-outer quadrant of right female breast Right lumpectomy 04/28/2015: Invasive ductal carcinoma grade 1, 0.9 cm, low-grade DCIS, margins negative, 0/6 sentinel nodes negative, ER 98%, PR 98%, HER-2 negative, Ki-67 6%, T1 BN 0 stage IA, Oncotype DX score 10, 7% risk of recurrence, Adj radiation therapy 06/15/2015 to 07/14/15 Current Treatment: Tamoxifen 20 mg daily started 07/30/15  Tamoxifen toxicities: 1. Occasional hot flashes 2. Emotional outbursts: Significantly improved after she was put on Effexor. She is able to handle life as he always used to.  Right upper quadrant abdominal pain: Slight tenderness to palpation below the right coastal margin as well as on the ribs. Chest x-ray and gallbladder ultrasound were normal: Fatty liver   Return to clinic in 6 months for follow-up   No orders of the defined types were placed in this  encounter.  The patient has a good understanding of the overall plan. she agrees with it. she will call with any problems that may develop before the next visit here.   Rulon Eisenmenger, MD 08/07/16

## 2016-08-07 NOTE — Assessment & Plan Note (Signed)
Right lumpectomy 04/28/2015: Invasive ductal carcinoma grade 1, 0.9 cm, low-grade DCIS, margins negative, 0/6 sentinel nodes negative, ER 98%, PR 98%, HER-2 negative, Ki-67 6%, T1 BN 0 stage IA, Oncotype DX score 10, 7% risk of recurrence, Adj radiation therapy 06/15/2015 to 07/14/15 Current Treatment: Tamoxifen 20 mg daily started 07/30/15  Tamoxifen toxicities: 1. Occasional hot flashes 2. Emotional outbursts: Significantly improved after she was put on Effexor. She is able to handle life as he always used to.  Right upper quadrant abdominal pain: Slight tenderness to palpation below the right coastal margin as well as on the ribs. Chest x-ray and gallbladder ultrasound were normal: Fatty liver   Return to clinic in 6 months for follow-up

## 2016-09-05 ENCOUNTER — Telehealth: Payer: Self-pay

## 2016-09-05 ENCOUNTER — Telehealth: Payer: Self-pay | Admitting: *Deleted

## 2016-09-05 ENCOUNTER — Other Ambulatory Visit: Payer: Self-pay | Admitting: Hematology and Oncology

## 2016-09-05 ENCOUNTER — Other Ambulatory Visit: Payer: Self-pay

## 2016-09-05 DIAGNOSIS — Z170 Estrogen receptor positive status [ER+]: Secondary | ICD-10-CM

## 2016-09-05 DIAGNOSIS — C50411 Malignant neoplasm of upper-outer quadrant of right female breast: Secondary | ICD-10-CM

## 2016-09-05 DIAGNOSIS — N63 Unspecified lump in unspecified breast: Secondary | ICD-10-CM

## 2016-09-05 NOTE — Telephone Encounter (Signed)
Pt called regarding a new lump she found yesterday in the shower. "I found a lump on and a red blotchy area near my left nipple and is sore to touch like a bruise." Pt denies any fevers or any other symptom. States " I am concerned about it and would like it checked." Placed order for new mammogram for evaluation of new lump on L breast. Offered to schedule this for pt asap, but pt states " I have sinus surgery this week and will not have availability until mid next week." Pt wants to set it up herself. Provided pt with phone number to breast center [place] [facility]. Pt appreciative and will call to schedule test.

## 2016-09-05 NOTE — Telephone Encounter (Signed)
Pt called reporting a new reddish brown blotch on left breast.   Spoke with pt, and was informed a new red spot on Left breast  just above nipple since yesterday am.  Stated oval shape, denied drainage, not raised spot;  Pt does have soreness to site.   Message sent to collaborative nurse. Pt's   Phone     [phone].

## 2016-09-08 ENCOUNTER — Other Ambulatory Visit: Payer: Self-pay | Admitting: Hematology and Oncology

## 2016-09-08 DIAGNOSIS — C50411 Malignant neoplasm of upper-outer quadrant of right female breast: Secondary | ICD-10-CM

## 2016-09-08 DIAGNOSIS — Z170 Estrogen receptor positive status [ER+]: Secondary | ICD-10-CM

## 2016-09-11 ENCOUNTER — Ambulatory Visit
Admission: RE | Admit: 2016-09-11 | Discharge: 2016-09-11 | Disposition: A | Discharge status: Home / Self Care | Payer: 59 | Source: Ambulatory Visit | Attending: Hematology and Oncology | Admitting: Hematology and Oncology

## 2016-09-11 DIAGNOSIS — Z170 Estrogen receptor positive status [ER+]: Secondary | ICD-10-CM

## 2016-09-11 DIAGNOSIS — C50411 Malignant neoplasm of upper-outer quadrant of right female breast: Secondary | ICD-10-CM

## 2016-09-11 DIAGNOSIS — N63 Unspecified lump in unspecified breast: Secondary | ICD-10-CM

## 2016-10-11 ENCOUNTER — Other Ambulatory Visit: Payer: Self-pay | Admitting: Hematology and Oncology

## 2016-10-11 DIAGNOSIS — C50411 Malignant neoplasm of upper-outer quadrant of right female breast: Secondary | ICD-10-CM

## 2016-11-12 DIAGNOSIS — M7661 Achilles tendinitis, right leg: Secondary | ICD-10-CM | POA: Diagnosis not present

## 2016-11-20 DIAGNOSIS — D225 Melanocytic nevi of trunk: Secondary | ICD-10-CM | POA: Diagnosis not present

## 2016-11-20 DIAGNOSIS — L820 Inflamed seborrheic keratosis: Secondary | ICD-10-CM | POA: Diagnosis not present

## 2016-11-20 DIAGNOSIS — L821 Other seborrheic keratosis: Secondary | ICD-10-CM | POA: Diagnosis not present

## 2016-11-20 DIAGNOSIS — D235 Other benign neoplasm of skin of trunk: Secondary | ICD-10-CM | POA: Diagnosis not present

## 2016-11-22 DIAGNOSIS — M62838 Other muscle spasm: Secondary | ICD-10-CM | POA: Diagnosis not present

## 2016-11-22 DIAGNOSIS — I10 Essential (primary) hypertension: Secondary | ICD-10-CM | POA: Diagnosis not present

## 2017-01-04 ENCOUNTER — Other Ambulatory Visit: Payer: Self-pay | Admitting: Hematology and Oncology

## 2017-01-04 DIAGNOSIS — C50411 Malignant neoplasm of upper-outer quadrant of right female breast: Secondary | ICD-10-CM

## 2017-01-06 ENCOUNTER — Telehealth: Payer: Self-pay

## 2017-01-06 NOTE — Telephone Encounter (Signed)
Called and left a message with a new appt   Shabnam Ladd 

## 2017-01-20 ENCOUNTER — Encounter: Payer: Self-pay | Admitting: Hematology and Oncology

## 2017-01-23 ENCOUNTER — Other Ambulatory Visit: Payer: Self-pay | Admitting: Emergency Medicine

## 2017-01-23 MED ORDER — VENLAFAXINE HCL ER 75 MG PO CP24: 75.0000 mg | ORAL_CAPSULE | Freq: Every day | ORAL | 3 refills | Status: DC

## 2017-02-05 ENCOUNTER — Ambulatory Visit: Payer: Self-pay | Admitting: Hematology and Oncology

## 2017-02-18 ENCOUNTER — Ambulatory Visit (HOSPITAL_BASED_OUTPATIENT_CLINIC_OR_DEPARTMENT_OTHER): Payer: BLUE CROSS/BLUE SHIELD | Admitting: Hematology and Oncology

## 2017-02-18 ENCOUNTER — Encounter: Payer: Self-pay | Admitting: Hematology and Oncology

## 2017-02-18 DIAGNOSIS — C50411 Malignant neoplasm of upper-outer quadrant of right female breast: Secondary | ICD-10-CM

## 2017-02-18 DIAGNOSIS — N951 Menopausal and female climacteric states: Secondary | ICD-10-CM | POA: Diagnosis not present

## 2017-02-18 DIAGNOSIS — R4589 Other symptoms and signs involving emotional state: Secondary | ICD-10-CM | POA: Diagnosis not present

## 2017-02-18 DIAGNOSIS — Z170 Estrogen receptor positive status [ER+]: Secondary | ICD-10-CM

## 2017-02-18 MED ORDER — TAMOXIFEN CITRATE 20 MG PO TABS: ORAL_TABLET | ORAL | 3 refills | Status: DC

## 2017-02-18 NOTE — Assessment & Plan Note (Signed)
Right lumpectomy 04/28/2015: Invasive ductal carcinoma grade 1, 0.9 cm, low-grade DCIS, margins negative, 0/6 sentinel nodes negative, ER 98%, PR 98%, HER-2 negative, Ki-67 6%, T1 BN 0 stage IA, Oncotype DX score 10, 7% risk of recurrence, Adj radiation therapy 06/15/2015 to 07/14/15 Current Treatment: Tamoxifen 20 mg daily started 07/30/15  Tamoxifen toxicities: 1. Occasional hot flashes 2. Emotional outbursts: Significantly improved after she was put on Effexor.  Surveillance: 1. Breast exam 02/18/2017 benign 2. mammogram 03/09/2016: No evidence of malignancy 3. Mammogram and ultrasound of the left breast 09/11/2016: No abnormalities of the palpable site of concern  Return to clinic in 1 year for follow-up

## 2017-02-18 NOTE — Progress Notes (Signed)
Patient Care Team: Carol Ada, MD as PCP - General (Family Medicine) Nicholas Lose, MD as Consulting Physician (Hematology and Oncology) Thea Silversmith, MD (Inactive) as Consulting Physician (Radiation Oncology) [name], MD as Consulting Physician (General Surgery) Sylvan Cheese, NP as Nurse Practitioner (Hematology and Oncology)  DIAGNOSIS:  Encounter Diagnosis  Name Primary?  . Malignant neoplasm of upper-outer quadrant of right breast in female, estrogen receptor positive ([place])     SUMMARY OF ONCOLOGIC HISTORY:   Breast cancer of upper-outer quadrant of right female breast ([place])   03/16/2015 Mammogram    Right breast: mass      03/16/2015 Breast US    Right breast: hypoechoic irregular shadowing mass at 12 o'clock, 4 cm from the nipple measuring 1.4 x 1.3 x 1.2 cm. Right axillary lymph nodes with smooth cortical thickness      03/22/2015 Initial Biopsy    Right breast biopsy: Invasive ductal carcinoma with calcifications; ER+ (98%), PR+ (98%), Ki-67 6%, HER-2 negative (ratio 1.35)      04/04/2015 Breast MRI    Right breast: 2.0 cm hematoma/ seroma within the upper central portion of the breast following recent stereotactic guided core biopsy      04/05/2015 Clinical Stage    Stage IA: T1c N0      04/06/2015 Procedure    Genetics: OvaNext panel revealed  VUTS at [place] (c.2164G>A) otherwise negative at ATM, BARD1, BRCA1, BRCA2, BRIP1, CDH1, CHEK2, EPCAM, MLH1, MRE11A, MSH6, MUTYH, NBN, NF1, PALB2, PMS2, PTEN, RAD50, RAD51C, RAD51D, SMARCA4, STK11, and TP53.       04/28/2015 Definitive Surgery    Right lumpectomy/[facility] [name]): Invasive ductal carcinoma grade 1, 0.9 cm, low-grade DCIS, margins negative, 0/6 sentinel nodes negative, ER 98%, PR 98%, HER-2 negative (ratio 1.29), Ki-67 6%,  Oncotype DX score 10 (7% ROR)      04/28/2015 Pathologic Stage    Stage IA (T1b N0)       04/28/2015 Oncotype testing    Score 10 (7% ROR)      06/15/2015 -  07/14/2015 Radiation Therapy    Adjuvant RT [name]): Right breast 42.72 Gy over 21 fractions; right breast boost 10 G over 5 fractions      07/14/2015 -  Anti-estrogen oral therapy    Tamoxifen 20 mg daily. Planned duration of therapy 5-10 years.      09/01/2015 Survivorship    Survivorship care plan completed and given to patient.       CHIEF COMPLIANT: Follow-up on tamoxifen therapy  INTERVAL HISTORY: [name] is a 46 year old with above-mentioned history of right breast cancer treated with lumpectomy and adjuvant radiation. She is currently on tamoxifen therapy since 2016. She is tolerating it extremely well. She does have occasional hot flashes. Denies any arthralgias or myalgias.  REVIEW OF SYSTEMS:   Constitutional: Denies fevers, chills or abnormal weight loss Eyes: Denies blurriness of vision Ears, nose, mouth, throat, and face: Denies mucositis or sore throat Respiratory: Denies cough, dyspnea or wheezes Cardiovascular: Denies palpitation, chest discomfort Gastrointestinal:  Denies nausea, heartburn or change in bowel habits Skin: Denies abnormal skin rashes Lymphatics: Denies new lymphadenopathy or easy bruising Neurological:Denies numbness, tingling or new weaknesses Behavioral/Psych: Mood is stable, no new changes  Extremities: No lower extremity edema Breast:  denies any pain or lumps or nodules in either breasts All other systems were reviewed with the patient and are negative.  I have reviewed the past medical history, past surgical history, social history and family history with the patient and  they are unchanged from previous note.  ALLERGIES:  is allergic to ciprofloxacin and sulfa antibiotics.  MEDICATIONS:  Current Outpatient Prescriptions  Medication Sig Dispense Refill  . ALPRAZolam (XANAX) 0.5 MG tablet TK 1 T PO Q 6 H PRA  1  . HYDROcodone-acetaminophen (NORCO/VICODIN) 5-325 MG tablet     . levothyroxine (SYNTHROID, LEVOTHROID) 50 MCG tablet  Take 50 mcg by mouth daily before breakfast.    . lisinopril-hydrochlorothiazide (PRINZIDE,ZESTORETIC) 10-12.5 MG tablet Take 1 tablet by mouth daily.    . metFORMIN (GLUCOPHAGE-XR) 500 MG 24 hr tablet Take 500 mg by mouth as needed. Takes for PCOS    . oxyCODONE (ROXICODONE) 5 MG immediate release tablet Take 1-2 tablets (5-10 mg total) by mouth every 4 (four) hours as needed for moderate pain or severe pain. 20 tablet 0  . tamoxifen (NOLVADEX) 20 MG tablet TAKE 1 TABLET(20 MG) BY MOUTH DAILY 90 tablet 0  . traMADol (ULTRAM) 50 MG tablet     . venlafaxine XR (EFFEXOR-XR) 75 MG 24 hr capsule Take 1 capsule (75 mg total) by mouth daily with breakfast. 90 capsule 3   No current facility-administered medications for this visit.     PHYSICAL EXAMINATION: ECOG PERFORMANCE STATUS: 1  Vitals:   02/18/17 1449  BP: 131/89  Pulse: 83  Resp: 16  Temp: 98.2 F (36.8 C)   Filed Weights   02/18/17 1449  Weight: 166 lb 11.2 oz (75.6 kg)    GENERAL:alert, no distress and comfortable SKIN: skin color, texture, turgor are normal, no rashes or significant lesions EYES: normal, Conjunctiva are pink and non-injected, sclera clear OROPHARYNX:no exudate, no erythema and lips, buccal mucosa, and tongue normal  NECK: supple, thyroid normal size, non-tender, without nodularity LYMPH:  no palpable lymphadenopathy in the cervical, axillary or inguinal LUNGS: clear to auscultation and percussion with normal breathing effort HEART: regular rate & rhythm and no murmurs and no lower extremity edema ABDOMEN:abdomen soft, non-tender and normal bowel sounds MUSCULOSKELETAL:no cyanosis of digits and no clubbing  NEURO: alert & oriented x 3 with fluent speech, no focal motor/sensory deficits EXTREMITIES: No lower extremity edema BREAST: No palpable masses or nodules in either right or left breasts. No palpable axillary supraclavicular or infraclavicular adenopathy no breast tenderness or nipple discharge. (exam  performed in the presence of a chaperone)  LABORATORY DATA:  I have reviewed the data as listed   Chemistry      Component Value Date/Time   NA 135 06/19/2016 1500   NA 140 07/14/2015 0818   K 3.3 (L) 06/19/2016 1500   K 3.1 (L) 07/14/2015 0818   CL 103 06/19/2016 1500   CO2 22 06/19/2016 1500   CO2 24 07/14/2015 0818   BUN 11 06/19/2016 1500   BUN 12.9 07/14/2015 0818   CREATININE 0.84 06/19/2016 1500   CREATININE 0.8 07/14/2015 0818      Component Value Date/Time   CALCIUM 10.0 06/19/2016 1500   CALCIUM 9.8 07/14/2015 0818   ALKPHOS 60 07/14/2015 0818   AST 14 07/14/2015 0818   ALT 20 07/14/2015 0818   BILITOT 0.76 07/14/2015 0818       Lab Results  Component Value Date   WBC 4.7 07/14/2015   HGB 14.8 07/14/2015   HCT 40.7 07/14/2015   MCV 86.6 07/14/2015   PLT 189 07/14/2015   NEUTROABS 3.3 07/14/2015    ASSESSMENT & PLAN:  Breast cancer of upper-outer quadrant of right female breast Right lumpectomy 04/28/2015: Invasive ductal carcinoma grade 1, 0.9 cm,  low-grade DCIS, margins negative, 0/6 sentinel nodes negative, ER 98%, PR 98%, HER-2 negative, Ki-67 6%, T1 BN 0 stage IA, Oncotype DX score 10, 7% risk of recurrence, Adj radiation therapy 06/15/2015 to 07/14/15 Current Treatment: Tamoxifen 20 mg daily started 07/30/15  Tamoxifen toxicities: 1. Occasional hot flashes 2. Emotional outbursts: Significantly improved after she was put on Effexor.  Surveillance: 1. Breast exam 02/18/2017 benign 2. mammogram 03/09/2016: No evidence of malignancy 3. Mammogram and ultrasound of the left breast 09/11/2016: No abnormalities of the palpable site of concern  Return to clinic in 1 year for follow-up   I spent 25 minutes talking to the patient of which more than half was spent in counseling and coordination of care.  No orders of the defined types were placed in this encounter.  The patient has a good understanding of the overall plan. she agrees with it. she will  call with any problems that may develop before the next visit here.   Rulon Eisenmenger, MD 02/18/17

## 2017-02-26 ENCOUNTER — Other Ambulatory Visit: Payer: Self-pay | Admitting: Hematology and Oncology

## 2017-02-26 DIAGNOSIS — Z853 Personal history of malignant neoplasm of breast: Secondary | ICD-10-CM

## 2017-03-11 ENCOUNTER — Ambulatory Visit
Admission: RE | Admit: 2017-03-11 | Discharge: 2017-03-11 | Disposition: A | Discharge status: Home / Self Care | Payer: BLUE CROSS/BLUE SHIELD | Source: Ambulatory Visit | Attending: Hematology and Oncology | Admitting: Hematology and Oncology

## 2017-03-11 DIAGNOSIS — R928 Other abnormal and inconclusive findings on diagnostic imaging of breast: Secondary | ICD-10-CM | POA: Diagnosis not present

## 2017-03-11 DIAGNOSIS — Z853 Personal history of malignant neoplasm of breast: Secondary | ICD-10-CM

## 2017-03-20 DIAGNOSIS — Z6826 Body mass index (BMI) 26.0-26.9, adult: Secondary | ICD-10-CM | POA: Diagnosis not present

## 2017-03-20 DIAGNOSIS — Z1212 Encounter for screening for malignant neoplasm of rectum: Secondary | ICD-10-CM | POA: Diagnosis not present

## 2017-03-20 DIAGNOSIS — Z01419 Encounter for gynecological examination (general) (routine) without abnormal findings: Secondary | ICD-10-CM | POA: Diagnosis not present

## 2017-03-26 ENCOUNTER — Other Ambulatory Visit: Payer: Self-pay

## 2017-03-26 DIAGNOSIS — Z170 Estrogen receptor positive status [ER+]: Secondary | ICD-10-CM

## 2017-03-26 DIAGNOSIS — C50411 Malignant neoplasm of upper-outer quadrant of right female breast: Secondary | ICD-10-CM

## 2017-03-26 NOTE — Progress Notes (Signed)
Pt called requesting referral to the lymphedema clinic since it has been a year since she was seen. Pt has an appt at the cancer rehab tomorrow . Will call in referral today.

## 2017-03-27 ENCOUNTER — Ambulatory Visit: Payer: BLUE CROSS/BLUE SHIELD | Admitting: Physical Therapy

## 2017-03-27 ENCOUNTER — Ambulatory Visit: Payer: BLUE CROSS/BLUE SHIELD | Attending: Hematology and Oncology | Admitting: Physical Therapy

## 2017-03-27 DIAGNOSIS — I890 Lymphedema, not elsewhere classified: Secondary | ICD-10-CM | POA: Insufficient documentation

## 2017-03-27 DIAGNOSIS — M25511 Pain in right shoulder: Secondary | ICD-10-CM | POA: Insufficient documentation

## 2017-03-27 NOTE — Therapy (Addendum)
[place], [place], [address] Phone: [phone]   Fax:  [phone]  Physical Therapy Evaluation  Patient Details  Name: LAAIBAH WARTMAN MRN: [id number] Date of Birth: [birth year] Referring Provider: Dr. Nicholas Lose  Encounter Date: 03/27/2017      PT End of Session - 03/27/17 1715    Visit Number 1   Number of Visits 5   Date for PT Re-Evaluation 04/26/17   PT Start Time 1245   PT Stop Time 1650   PT Time Calculation (min) 40 min   Activity Tolerance Patient tolerated treatment well   Behavior During Therapy [facility] for tasks assessed/performed      Past Medical History:  Diagnosis Date  . Family history of breast cancer   . Hypertension   . Hypothyroidism   . Invasive ductal carcinoma of right breast in female [facility]) 03/22/2015   er+/PR+/her2-  . Menorrhagia   . PCOS (polycystic ovarian syndrome)   . Radiation 06/15/15-07/14/15   Right Breast ductal ca    Past Surgical History:  Procedure Laterality Date  . ACHILLES TENDON REPAIR Right 2002  . APPENDECTOMY  02-21-2009  . BILATERAL SALPINGECTOMY Bilateral 07/27/2014   Procedure: BILATERAL SALPINGECTOMY;  Surgeon: [name], MD;  Location: [facility];  Service: Gynecology;  Laterality: Bilateral;  . Breast, Right Needle Core Biospy Right 03/22/15  . CESAREAN SECTION  10-09-2000  &  1999  . EXCISION HAGLUND'S DEFORMITY WITH ACHILLES TENDON REPAIR Right 06/21/2016   Procedure: RIGHT ACHILLES TENDON DEBRIDEMENT AND RECONSTRUCTION; EXCISION HAGLUND DEFORMITY;  Surgeon: Wylene Simmer, MD;  Location: [place];  Service: Orthopedics;  Laterality: Right;  . GASTROC RECESSION EXTREMITY Right 06/21/2016   Procedure: GASTROC RECESSION;  Surgeon: Wylene Simmer, MD;  Location: [place];  Service: Orthopedics;  Laterality: Right;  . HYSTEROSCOPY WITH NOVASURE  11/ 2014  . LAPAROSCOPIC ASSISTED VAGINAL  HYSTERECTOMY N/A 07/27/2014   Procedure: LAPAROSCOPIC ASSISTED VAGINAL HYSTERECTOMY;  Surgeon: [name], MD;  Location: [place];  Service: Gynecology;  Laterality: N/A;  . RADIOACTIVE SEED GUIDED MASTECTOMY WITH AXILLARY SENTINEL LYMPH NODE BIOPSY Right 04/28/2015   Procedure: RADIOACTIVE SEED GUIDED RIGHT BREAST PARTIAL MASTECTOMY WITH RIGHT AXILLARY SENTINEL LYMPH NODE BIOPSY;  Surgeon: [name], MD;  Location: [place];  Service: General;  Laterality: Right;  . TONSILLECTOMY  age 83    There were no vitals filed for this visit.       Subjective Assessment - 03/27/17 1611    Subjective "I started over the last couple weeks noticing that my rings on my right hand were tighter than usual.  My mom passed away in February 26, 2023 and I've gained some weight, but my left rings still come off okay.  Two nights ago I woke up with burning pain at (right posterior axilla). I woke up and I didn't see anything but it continued to feel like a scrape or burning pain that doesn't go away."  Has an achiness at right flank.   Pertinent History Right lumpectomy with 6 nodes removed 04/28/15; adjuvant radiation completed 07/14/15. Is now on tamoxifen.  HTN controlled with meds. Hypothyroid. Right Achilles surgery 05/2016; well healed from that.   Patient Stated Goals feel relieved that this isn't lymphedema   Currently in Pain? Yes   Pain Score 7    Pain Location Axilla   Pain Orientation Right;Posterior   Pain Descriptors / Indicators Burning   Pain Frequency Constant  Aggravating Factors  nothing   Pain Relieving Factors not moving arm, putting ice on it            [facility] PT Assessment - 03/27/17 0001      Assessment   Medical Diagnosis right breast cancer   Referring Provider Dr. Nicholas Lose   Onset Date/Surgical Date 04/28/15   Hand Dominance Left   Prior Therapy none     Precautions   Precautions Other (comment)   Precaution Comments cancer  precautions     Restrictions   Weight Bearing Restrictions No     Balance Screen   Has the patient fallen in the past 6 months No   Has the patient had a decrease in activity level because of a fear of falling?  No  but cautious due to Achilles injury 1.5 years ago   Is the patient reluctant to leave their home because of a fear of falling?  No     Home Social worker Private residence   Living Arrangements Spouse/significant other;Children   Type of [place] Two level     Prior Function   Level of [place] Unemployed   Leisure not currently  looking after her father     Cognition   Overall Cognitive Status Within Functional Limits for tasks assessed     Posture/Postural Control   Posture/Postural Control No significant limitations     ROM / Strength   AROM / PROM / Strength AROM     AROM   Overall AROM Comments both shoulders WFL with no visible difference     Ambulation/Gait   Ambulation/Gait Yes   Ambulation/Gait Assistance 7: Independent           LYMPHEDEMA/ONCOLOGY QUESTIONNAIRE - 03/27/17 1631      Type   Cancer Type right breast     Surgeries   Lumpectomy Date 04/28/15     Treatment   Past Radiation Treatment Yes   Date 07/14/15   Current Hormone Treatment Yes   Drug Name tamoxifen     Lymphedema Assessments   Lymphedema Assessments Upper extremities     Right Upper Extremity Lymphedema   15 cm Proximal to Olecranon Process 29.9 cm   10 cm Proximal to Olecranon Process 28 cm   Olecranon Process 25.3 cm   15 cm Proximal to Ulnar Styloid Process 25.6 cm   10 cm Proximal to Ulnar Styloid Process 22.4 cm   Just Proximal to Ulnar Styloid Process 16 cm   Across Hand at [employer] 19.9 cm   At [place] of 2nd Digit 6.1 cm   Other base of 3rd digit = 6   Other pt. is about 8 lbs. heavier than at last measurement     Left Upper Extremity Lymphedema   15 cm Proximal to Olecranon  Process 31 cm   10 cm Proximal to Olecranon Process 29.3 cm   Olecranon Process 25.4 cm   15 cm Proximal to Ulnar Styloid Process 25.6 cm   10 cm Proximal to Ulnar Styloid Process 22.9 cm   Just Proximal to Ulnar Styloid Process 16.1 cm   Across Hand at [employer] 19.6 cm   At [place] of 2nd Digit 6.2 cm   Other base of 3rd digit = 6         Objective measurements completed on examination: See above findings.  PT Education - 03/27/17 1720    Education provided Yes   Education Details to wear compression sleeve regularly for the time being and to consider buying a compression glove to wear as well   Person(s) Educated Patient   Methods Explanation   Comprehension Verbalized understanding                [place] - 03/27/17 1724      CC Long Term Goal  #1   Title Patient with verbalize an understanding of lymphedema risk reduction precautions   Time 2   Period Weeks   Status New     CC Long Term Goal  #2   Title Patient will be independent in a  home exercise program for right shoulder area stretching   Time 2   Period Weeks   Status New     CC Long Term Goal  #3   Title Patient will be know how to obtain and use compression garments for maintenance phase of treatment   Time 2   Period Weeks   Status New     CC Long Term Goal  #4   Title Pt. will report at least 30% decrease in right posterior axilla-area pain.   Time 3   Period Weeks   Status New             Plan - 03/27/17 1716    Clinical Impression Statement This is a pleasant woman known to this clinic from a few treatment sessions two years ago when she was closer to the time of her breast cancer treatment.  She has recently noticed some swelling in her right hand and at the same time developed a burning pain at her right posterior axilla.  She would like to avoid lymphedema.  Her UE circumference measurements do not show a significant difference from  right to left, but her right fingers and hand do look slightly swollen.  Her shoulder AROM is full and fairly even bilaterally, but she does feel some tightness with movement to end range.  She has a compression sleeve that she has been wearing the past couple days; therapist recommended today that she continue to wear this and to consider getting a compression glove to wear as well, just in case she is developing lymphedema.  She plans to do this.    History and Personal Factors relevant to plan of care: none   Clinical Presentation Evolving   Clinical Decision Making Low   Rehab Potential Good   Clinical Impairments Affecting Rehab Potential none   PT Frequency 1x / week   PT Duration 2 weeks  to 4 weeks prn   PT Treatment/Interventions ADLs/Self Care Home Management;Therapeutic exercise;Patient/family education;Manual techniques;Manual lymph drainage;Passive range of motion;Taping   PT Next Visit Plan Instruct patient in a variety of stretches for right shoulder area; if time, do myofascial release work for right axilla area.  Check right UE circumferences.   Consulted and Agree with Plan of Care Patient      Patient will benefit from skilled therapeutic intervention in order to improve the following deficits and impairments:  Increased edema, Pain  Visit Diagnosis: Lymphedema, not elsewhere classified - Plan: PT plan of care cert/re-cert  Acute pain of right shoulder - Plan: PT plan of care cert/re-cert     Problem List Patient Active Problem List   Diagnosis Date Noted  . Right upper quadrant abdominal pain 12/26/2015  . Genetic testing 04/27/2015  . Family history  of breast cancer   . Breast cancer of upper-outer quadrant of right female breast ([place]) 03/29/2015  . S/P laparoscopic assisted vaginal hysterectomy ([facility]) 07/27/2014    SALISBURY,DONNA 03/27/2017, 5:27 PM  [place], [place],  [address] Phone: [phone]   Fax:  [phone]  Name: RIKA DAUGHDRILL MRN: [id number] Date of Birth: 01/18/1971  [name], PT 03/27/17 5:27 PM  PHYSICAL THERAPY DISCHARGE SUMMARY  Visits from Start of Care: 1  Current functional level related to goals / functional outcomes: Unknown, as the patient was scheduled for follow-up but did not attend any more therapy sessions.  She called prior to her first scheduled follow-up and reported she had acquired shingles, so needed to cancel.  We did not hear from her after that.   Remaining deficits: Unknown   Education / Equipment: About use of compression garments.  Plan: Patient agrees to discharge.  Patient goals were not met. Patient is being discharged due to not returning since the last visit.  ?????    [name], PT 01/23/18 3:43 PM

## 2017-03-28 DIAGNOSIS — E039 Hypothyroidism, unspecified: Secondary | ICD-10-CM | POA: Diagnosis not present

## 2017-03-28 DIAGNOSIS — E282 Polycystic ovarian syndrome: Secondary | ICD-10-CM | POA: Diagnosis not present

## 2017-03-28 DIAGNOSIS — I10 Essential (primary) hypertension: Secondary | ICD-10-CM | POA: Diagnosis not present

## 2017-03-28 DIAGNOSIS — C50919 Malignant neoplasm of unspecified site of unspecified female breast: Secondary | ICD-10-CM | POA: Diagnosis not present

## 2017-03-29 DIAGNOSIS — R5383 Other fatigue: Secondary | ICD-10-CM | POA: Diagnosis not present

## 2017-03-29 DIAGNOSIS — R21 Rash and other nonspecific skin eruption: Secondary | ICD-10-CM | POA: Diagnosis not present

## 2017-04-05 ENCOUNTER — Ambulatory Visit: Payer: BLUE CROSS/BLUE SHIELD | Admitting: Physical Therapy

## 2017-04-16 DIAGNOSIS — B0229 Other postherpetic nervous system involvement: Secondary | ICD-10-CM | POA: Diagnosis not present

## 2017-05-07 DIAGNOSIS — B0229 Other postherpetic nervous system involvement: Secondary | ICD-10-CM | POA: Diagnosis not present

## 2017-06-26 DIAGNOSIS — M542 Cervicalgia: Secondary | ICD-10-CM | POA: Diagnosis not present

## 2017-07-23 DIAGNOSIS — Z23 Encounter for immunization: Secondary | ICD-10-CM | POA: Diagnosis not present

## 2017-09-10 DIAGNOSIS — J069 Acute upper respiratory infection, unspecified: Secondary | ICD-10-CM | POA: Diagnosis not present

## 2017-09-13 DIAGNOSIS — R300 Dysuria: Secondary | ICD-10-CM | POA: Diagnosis not present

## 2017-10-16 DIAGNOSIS — N83292 Other ovarian cyst, left side: Secondary | ICD-10-CM | POA: Diagnosis not present

## 2017-10-16 DIAGNOSIS — R102 Pelvic and perineal pain: Secondary | ICD-10-CM | POA: Diagnosis not present

## 2017-10-16 DIAGNOSIS — R82998 Other abnormal findings in urine: Secondary | ICD-10-CM | POA: Diagnosis not present

## 2017-10-30 ENCOUNTER — Telehealth: Payer: Self-pay

## 2017-10-30 ENCOUNTER — Other Ambulatory Visit: Payer: Self-pay

## 2017-10-30 ENCOUNTER — Encounter: Payer: Self-pay | Admitting: Physician Assistant

## 2017-10-30 DIAGNOSIS — C50411 Malignant neoplasm of upper-outer quadrant of right female breast: Secondary | ICD-10-CM

## 2017-10-30 DIAGNOSIS — Z803 Family history of malignant neoplasm of breast: Secondary | ICD-10-CM

## 2017-10-30 DIAGNOSIS — Z170 Estrogen receptor positive status [ER+]: Secondary | ICD-10-CM

## 2017-10-30 NOTE — Telephone Encounter (Signed)
Returned pt call regarding her concerns with rectal bleeding. Scheduled pt to have labs tomorrow to check HGB and pt to be seen in symptom management. Informed pt referral was sent to [facility] and she should be expecting a call from their office. Pt aware of appointment times tomorrow. No further questions at this time.  Cyndia Bent RN

## 2017-10-31 ENCOUNTER — Inpatient Hospital Stay: Payer: BLUE CROSS/BLUE SHIELD | Attending: Medical | Admitting: Medical

## 2017-10-31 ENCOUNTER — Other Ambulatory Visit (HOSPITAL_BASED_OUTPATIENT_CLINIC_OR_DEPARTMENT_OTHER): Payer: BLUE CROSS/BLUE SHIELD

## 2017-10-31 VITALS — BP 135/84 | HR 74 | Temp 98.0°F | Resp 16 | Ht 66.0 in | Wt 178.1 lb

## 2017-10-31 DIAGNOSIS — C50411 Malignant neoplasm of upper-outer quadrant of right female breast: Secondary | ICD-10-CM | POA: Diagnosis not present

## 2017-10-31 DIAGNOSIS — R109 Unspecified abdominal pain: Secondary | ICD-10-CM

## 2017-10-31 DIAGNOSIS — K625 Hemorrhage of anus and rectum: Secondary | ICD-10-CM | POA: Diagnosis not present

## 2017-10-31 DIAGNOSIS — Z170 Estrogen receptor positive status [ER+]: Secondary | ICD-10-CM

## 2017-10-31 LAB — CBC WITH DIFFERENTIAL/PLATELET
BASO%: 0.6 % (ref 0.0–2.0)
Basophils Absolute: 0 10*3/uL (ref 0.0–0.1)
EOS ABS: 0.1 10*3/uL (ref 0.0–0.5)
EOS%: 2.2 % (ref 0.0–7.0)
HEMATOCRIT: 40.1 % (ref 34.8–46.6)
HGB: 13.6 g/dL (ref 11.6–15.9)
LYMPH#: 1.8 10*3/uL (ref 0.9–3.3)
LYMPH%: 32.9 % (ref 14.0–49.7)
MCH: 31.3 pg (ref 25.1–34.0)
MCHC: 33.9 g/dL (ref 31.5–36.0)
MCV: 92.2 fL (ref 79.5–101.0)
MONO#: 0.3 10*3/uL (ref 0.1–0.9)
MONO%: 5.7 % (ref 0.0–14.0)
NEUT#: 3.2 10*3/uL (ref 1.5–6.5)
NEUT%: 58.6 % (ref 38.4–76.8)
PLATELETS: 200 10*3/uL (ref 145–400)
RBC: 4.35 10*6/uL (ref 3.70–5.45)
RDW: 12.6 % (ref 11.2–14.5)
WBC: 5.4 10*3/uL (ref 3.9–10.3)

## 2017-11-01 NOTE — Progress Notes (Signed)
[facility] Progress Note   [name] [id number] 1971-06-04 47 y.o.  [name] is managed by Dr. Nicholas Lose  Actively treated with chemotherapy: no   Assessment: Plan:    Rectal bleeding  Abdominal cramping   Rectal bleeding and abdominal cramping: The patient will proceed with her appointment with gastroenterology on 11/11/2017.  I have instructed the patient to contact our office should her gastroenterologist not find the etiology of her bright red blood per rectum and abdominal cramping at which time she would be referred for a CT of the abdomen and pelvis.  I reviewed the patient's labs with her today which did not show anemia and did not show microcytosis.  Please see After Visit Summary for patient specific instructions.  Future Appointments  Date Time Provider [place]  11/11/2017 10:00 AM Esterwood, Amy S, PA-C [facility]  02/18/2018  1:45 PM Nicholas Lose, MD [facility] None    No orders of the defined types were placed in this encounter.      Subjective:   Patient ID:  [name] is a 47 y.o. (DOB [birth year]) female.  Chief Complaint:  Chief Complaint  Patient presents with  . 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  She was staged as a stage Ia (T1b, N0) and was treated with radiation therapy from 06/15/2015 through 07/14/2015.  She was then initiated on tamoxifen on 07/14/2015.  She was last seen by Dr. Nicholas Lose on 02/18/2017.  She reports having episodic bright red blood per rectum with bowel movements since this past summer.  This is increased in severity.  She has had bright red blood per rectum which is presented minimally as blood on the toilet paper but is also presented as blood in the toilet at times.  She denies any rectal pain.  She denies  constipation.  She has also had some lower abdominal pain which is increased after a bowel movement.  This is described as cramping.  Her last colonoscopy was 2 years ago.  She is scheduled to see her gastroenterologist on 11/11/2017.  She has had no weight loss.  She does report that she has been on her feet more recently.  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  Medications: I have reviewed the patient's current medications.  Allergies:  Allergies  Allergen Reactions  . Ciprofloxacin Rash  . Sulfa Antibiotics Nausea And Vomiting    Fever     Past Medical History:  Diagnosis Date  . Family history of breast cancer   . Hypertension   . Hypothyroidism   . 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 Menorrhagia   . PCOS (polycystic ovarian syndrome)   . Radiation 06/15/15-07/14/15   Right Breast ductal ca    Past Surgical History:  Procedure Laterality Date  . ACHILLES TENDON REPAIR Right 2002  . APPENDECTOMY  02-21-2009  . BILATERAL SALPINGECTOMY Bilateral 07/27/2014   Procedure: BILATERAL SALPINGECTOMY;  Surgeon: [name], MD;  Location: [facility];  Service: Gynecology;  Laterality: Bilateral;  . Breast, Right Needle Core Biospy Right 03/22/15  . CESAREAN SECTION  10-09-2000  &  1999  .  EXCISION HAGLUND'S DEFORMITY WITH ACHILLES TENDON REPAIR Right 06/21/2016   Procedure: RIGHT ACHILLES TENDON DEBRIDEMENT AND RECONSTRUCTION; EXCISION HAGLUND DEFORMITY;  Surgeon: Wylene Simmer, MD;  Location: [place];  Service: Orthopedics;  Laterality: Right;  . GASTROC RECESSION EXTREMITY Right 06/21/2016   Procedure: GASTROC RECESSION;  Surgeon: Wylene Simmer, MD;  Location: [place];  Service: Orthopedics;  Laterality: Right;  . HYSTEROSCOPY WITH NOVASURE  11/ 2014  . LAPAROSCOPIC ASSISTED VAGINAL HYSTERECTOMY N/A  07/27/2014   Procedure: LAPAROSCOPIC ASSISTED VAGINAL HYSTERECTOMY;  Surgeon: [name], MD;  Location: [place];  Service: Gynecology;  Laterality: N/A;  . RADIOACTIVE SEED GUIDED PARTIAL MASTECTOMY WITH AXILLARY SENTINEL LYMPH NODE BIOPSY Right 04/28/2015   Procedure: RADIOACTIVE SEED GUIDED RIGHT BREAST PARTIAL MASTECTOMY WITH RIGHT AXILLARY SENTINEL LYMPH NODE BIOPSY;  Surgeon: [name], MD;  Location: [place];  Service: General;  Laterality: Right;  . TONSILLECTOMY  age 55    Family History  Problem Relation Age of Onset  . Breast cancer Maternal Aunt 69  . Lung cancer Maternal Uncle        smoker  . Prostate cancer Paternal Uncle   . Colon cancer Maternal Grandfather   . ALS Paternal Grandfather   . Breast cancer Other        MGM's 2 sisters possibly had breast cancer    Social History   Socioeconomic History  . Marital status: Married    Spouse name: Not on file  . Number of children: 2  . Years of education: Not on file  . Highest education level: Not on file  Social Needs  . Financial resource strain: Not on file  . Food insecurity - worry: Not on file  . Food insecurity - inability: Not on file  . Transportation needs - medical: Not on file  . Transportation needs - non-medical: Not on file  Occupational History  . Not on file  Tobacco Use  . Smoking status: Never Smoker  . Smokeless tobacco: Never Used  Substance and Sexual Activity  . Alcohol use: Yes    Alcohol/week: 1.8 oz    Types: 3 Glasses of wine per week    Comment: 2 glasses of wine weekly  . Drug use: No  . Sexual activity: Yes    Birth control/protection: Surgical  Other Topics Concern  . Not on file  Social History Narrative  . Not on file    Past Medical History, Surgical history, Social history, and Family history were reviewed and updated as appropriate.   Please see review of systems for further details on the patient's review from  today.   Review of Systems:  Review of Systems  Constitutional: Negative for activity change, fatigue and unexpected weight change.  Gastrointestinal: Positive for abdominal pain, anal bleeding and blood in stool. Negative for constipation, diarrhea and rectal pain.    Objective:   Physical Exam:  BP 135/84 (BP Location: Left Arm, Patient Position: Sitting)   Pulse 74   Temp 98 F (36.7 C) (Oral)   Resp 16   Ht 5' 6"  (1.676 m)   Wt 178 lb 1.6 oz (80.8 kg)   LMP 07/14/2014   SpO2 100%   BMI 28.75 kg/m  ECOG: 0  Physical Exam  Constitutional: She appears well-developed and well-nourished. No distress.  Neurological: She is alert. Coordination normal.  Skin: She is not diaphoretic.  Psychiatric: She has a normal mood and affect. Her behavior is normal. Judgment and thought  content normal.    Lab Review:     Component Value Date/Time   NA 135 06/19/2016 1500   NA 140 07/14/2015 0818   K 3.3 (L) 06/19/2016 1500   K 3.1 (L) 07/14/2015 0818   CL 103 06/19/2016 1500   CO2 22 06/19/2016 1500   CO2 24 07/14/2015 0818   GLUCOSE 157 (H) 06/19/2016 1500   GLUCOSE 131 07/14/2015 0818   BUN 11 06/19/2016 1500   BUN 12.9 07/14/2015 0818   CREATININE 0.84 06/19/2016 1500   CREATININE 0.8 07/14/2015 0818   CALCIUM 10.0 06/19/2016 1500   CALCIUM 9.8 07/14/2015 0818   PROT 7.6 07/14/2015 0818   ALBUMIN 4.5 07/14/2015 0818   AST 14 07/14/2015 0818   ALT 20 07/14/2015 0818   ALKPHOS 60 07/14/2015 0818   BILITOT 0.76 07/14/2015 0818   GFRNONAA >60 06/19/2016 1500   GFRAA >60 06/19/2016 1500       Component Value Date/Time   WBC 5.4 10/31/2017 0838   WBC 8.0 04/26/2015 1405   RBC 4.35 10/31/2017 0838   RBC 5.19 (H) 04/26/2015 1405   HGB 13.6 10/31/2017 0838   HCT 40.1 10/31/2017 0838   PLT 200 10/31/2017 0838   MCV 92.2 10/31/2017 0838   MCH 31.3 10/31/2017 0838   MCH 32.0 04/26/2015 1405   MCHC 33.9 10/31/2017 0838   MCHC 36.4 (H) 04/26/2015 1405   RDW 12.6  10/31/2017 0838   LYMPHSABS 1.8 10/31/2017 0838   MONOABS 0.3 10/31/2017 0838   EOSABS 0.1 10/31/2017 0838   BASOSABS 0.0 10/31/2017 0838   -------------------------------  Imaging from last 24 hours (if applicable):  Radiology interpretation: No results found.

## 2017-11-06 ENCOUNTER — Telehealth: Payer: Self-pay | Admitting: Medical

## 2017-11-06 NOTE — Telephone Encounter (Signed)
Per 1/3 no los °

## 2017-11-11 ENCOUNTER — Ambulatory Visit: Payer: Self-pay | Admitting: Physician Assistant

## 2017-11-11 ENCOUNTER — Telehealth: Payer: Self-pay | Admitting: Physician Assistant

## 2017-11-11 DIAGNOSIS — K602 Anal fissure, unspecified: Secondary | ICD-10-CM | POA: Diagnosis not present

## 2017-11-11 DIAGNOSIS — K625 Hemorrhage of anus and rectum: Secondary | ICD-10-CM | POA: Diagnosis not present

## 2017-12-06 ENCOUNTER — Telehealth: Payer: Self-pay

## 2017-12-06 NOTE — Telephone Encounter (Signed)
Unable to leave a voice message for patient . Mailed a letter with change of appointment on 4/23 to 4/30 at the same time 1:45. Per rescheduling 2/8

## 2017-12-07 ENCOUNTER — Ambulatory Visit: Payer: Self-pay | Admitting: Emergency Medicine

## 2017-12-07 VITALS — BP 112/78 | HR 86 | Temp 99.1°F | Resp 18

## 2017-12-07 DIAGNOSIS — H019 Unspecified inflammation of eyelid: Secondary | ICD-10-CM

## 2017-12-07 DIAGNOSIS — H1031 Unspecified acute conjunctivitis, right eye: Secondary | ICD-10-CM

## 2017-12-07 MED ORDER — CEPHALEXIN 500 MG PO CAPS: 500.0000 mg | ORAL_CAPSULE | Freq: Four times a day (QID) | ORAL | 0 refills | Status: DC

## 2017-12-07 MED ORDER — TOBRAMYCIN 0.3 % OP SOLN: 1.0000 [drp] | OPHTHALMIC | 0 refills | Status: DC

## 2017-12-07 NOTE — Progress Notes (Signed)
Subjective:    THERMA LASURE is a 47 y.o. female who presents for evaluation of discharge, pain and tearing in the left eye. She has noticed the above symptoms for 2 days. Onset was gradual. Patient denies blurred vision, foreign body sensation and visual field deficit. There is a history of allergies and she take pataday eye drops.  The following portions of the patient's history were reviewed and updated as appropriate: allergies and current medications.  Review of Systems Pertinent items are noted in HPI.   Objective:    BP 112/78 (BP Location: Left Arm, Patient Position: Sitting, Cuff Size: Normal)   Pulse 86   Temp 99.1 F (37.3 C) (Oral)   Resp 18   LMP 07/14/2014   SpO2 97%       General: alert, cooperative and appears stated age  Eyes:  negative findings: pupils equal, round, reactive to light and accomodation, positive findings: eyelids/periorbital: chalazion on the right and erythemia and conjunctiva: injected injection, discharge noted exudate, yellow and thick  Vision: Not performed  Fluorescein:  not done     Assessment:    Acute conjunctivitis and Chalazion   Plan:    Discussed the diagnosis and proper care of conjunctivitis.  Stressed household Nurse, mental health. Ophthalmic drops per orders. Antibiotics per orders. Warm compress to eye(s). Local eye care discussed. FU with PCP in 2 days or PRN.

## 2017-12-07 NOTE — Patient Instructions (Signed)

## 2017-12-12 ENCOUNTER — Encounter: Payer: Self-pay | Admitting: Hematology and Oncology

## 2017-12-13 DIAGNOSIS — H16203 Unspecified keratoconjunctivitis, bilateral: Secondary | ICD-10-CM | POA: Diagnosis not present

## 2018-01-23 ENCOUNTER — Other Ambulatory Visit: Payer: Self-pay | Admitting: Hematology and Oncology

## 2018-01-30 ENCOUNTER — Other Ambulatory Visit: Payer: Self-pay | Admitting: Hematology and Oncology

## 2018-01-30 ENCOUNTER — Telehealth: Payer: Self-pay | Admitting: Hematology and Oncology

## 2018-01-30 DIAGNOSIS — Z9889 Other specified postprocedural states: Secondary | ICD-10-CM

## 2018-01-30 NOTE — Telephone Encounter (Signed)
Tried to call regarding voicemail  °

## 2018-02-10 ENCOUNTER — Telehealth: Payer: Self-pay | Admitting: Hematology and Oncology

## 2018-02-10 NOTE — Telephone Encounter (Signed)
Patient called to reschedule  °

## 2018-02-18 ENCOUNTER — Ambulatory Visit: Payer: Self-pay | Admitting: Hematology and Oncology

## 2018-02-25 ENCOUNTER — Ambulatory Visit: Payer: Self-pay | Admitting: Hematology and Oncology

## 2018-02-27 DIAGNOSIS — E282 Polycystic ovarian syndrome: Secondary | ICD-10-CM | POA: Diagnosis not present

## 2018-02-27 DIAGNOSIS — E039 Hypothyroidism, unspecified: Secondary | ICD-10-CM | POA: Diagnosis not present

## 2018-02-27 DIAGNOSIS — I10 Essential (primary) hypertension: Secondary | ICD-10-CM | POA: Diagnosis not present

## 2018-02-27 DIAGNOSIS — N951 Menopausal and female climacteric states: Secondary | ICD-10-CM | POA: Diagnosis not present

## 2018-02-27 DIAGNOSIS — R635 Abnormal weight gain: Secondary | ICD-10-CM | POA: Diagnosis not present

## 2018-02-27 DIAGNOSIS — C50919 Malignant neoplasm of unspecified site of unspecified female breast: Secondary | ICD-10-CM | POA: Diagnosis not present

## 2018-03-03 ENCOUNTER — Telehealth: Payer: Self-pay | Admitting: Hematology and Oncology

## 2018-03-03 ENCOUNTER — Inpatient Hospital Stay: Payer: BLUE CROSS/BLUE SHIELD | Attending: Hematology and Oncology | Admitting: Hematology and Oncology

## 2018-03-03 DIAGNOSIS — Z923 Personal history of irradiation: Secondary | ICD-10-CM | POA: Insufficient documentation

## 2018-03-03 DIAGNOSIS — C50411 Malignant neoplasm of upper-outer quadrant of right female breast: Secondary | ICD-10-CM | POA: Diagnosis not present

## 2018-03-03 DIAGNOSIS — Z170 Estrogen receptor positive status [ER+]: Secondary | ICD-10-CM | POA: Diagnosis not present

## 2018-03-03 DIAGNOSIS — Z79810 Long term (current) use of selective estrogen receptor modulators (SERMs): Secondary | ICD-10-CM | POA: Diagnosis not present

## 2018-03-03 DIAGNOSIS — R232 Flushing: Secondary | ICD-10-CM | POA: Insufficient documentation

## 2018-03-03 MED ORDER — VENLAFAXINE HCL ER 75 MG PO CP24: 75.0000 mg | ORAL_CAPSULE | Freq: Every day | ORAL | 3 refills | Status: DC

## 2018-03-03 MED ORDER — TAMOXIFEN CITRATE 20 MG PO TABS: ORAL_TABLET | ORAL | 3 refills | Status: DC

## 2018-03-03 NOTE — Progress Notes (Signed)
Patient Care Team: Carol Ada, MD as PCP - General (Family Medicine) Nicholas Lose, MD as Consulting Physician (Hematology and Oncology) Thea Silversmith, MD (Inactive) as Consulting Physician (Radiation Oncology) [name], MD as Consulting Physician (General Surgery) Sylvan Cheese, NP as Nurse Practitioner (Hematology and Oncology)  DIAGNOSIS:  Encounter Diagnosis  Name Primary?  . Malignant neoplasm of upper-outer quadrant of right breast in female, estrogen receptor positive ([place])     SUMMARY OF ONCOLOGIC HISTORY:   Breast cancer of upper-outer quadrant of right female breast ([place])   03/16/2015 Mammogram    Right breast: mass      03/16/2015 Breast US    Right breast: hypoechoic irregular shadowing mass at 12 o'clock, 4 cm from the nipple measuring 1.4 x 1.3 x 1.2 cm. Right axillary lymph nodes with smooth cortical thickness      03/22/2015 Initial Biopsy    Right breast biopsy: Invasive ductal carcinoma with calcifications; ER+ (98%), PR+ (98%), Ki-67 6%, HER-2 negative (ratio 1.35)      04/04/2015 Breast MRI    Right breast: 2.0 cm hematoma/ seroma within the upper central portion of the breast following recent stereotactic guided core biopsy      04/05/2015 Clinical Stage    Stage IA: T1c N0      04/06/2015 Procedure    Genetics: OvaNext panel revealed  VUTS at [place] (c.2164G>A) otherwise negative at ATM, BARD1, BRCA1, BRCA2, BRIP1, CDH1, CHEK2, EPCAM, MLH1, MRE11A, MSH6, MUTYH, NBN, NF1, PALB2, PMS2, PTEN, RAD50, RAD51C, RAD51D, SMARCA4, STK11, and TP53.       04/28/2015 Definitive Surgery    Right lumpectomy/SLNB [name]): Invasive ductal carcinoma grade 1, 0.9 cm, low-grade DCIS, margins negative, 0/6 sentinel nodes negative, ER 98%, PR 98%, HER-2 negative (ratio 1.29), Ki-67 6%,  Oncotype DX score 10 (7% ROR)      04/28/2015 Pathologic Stage    Stage IA (T1b N0)       04/28/2015 Oncotype testing    Score 10 (7% ROR)      06/15/2015 -  07/14/2015 Radiation Therapy    Adjuvant RT [name]): Right breast 42.72 Gy over 21 fractions; right breast boost 10 G over 5 fractions      07/14/2015 -  Anti-estrogen oral therapy    Tamoxifen 20 mg daily. Planned duration of therapy 5-10 years.      09/01/2015 Survivorship    Survivorship care plan completed and given to patient.       CHIEF COMPLIANT: Follow-up on tamoxifen therapy  INTERVAL HISTORY: [name] is a 47 year old with above-mentioned history of right breast cancer treated with lumpectomy and radiation is currently on tamoxifen.  She appears to be tolerating tamoxifen extremely well.  She complains of intermittent hot flashes.  Denies any arthralgias myalgias or cramps.  REVIEW OF SYSTEMS:   Constitutional: Denies fevers, chills or abnormal weight loss Eyes: Denies blurriness of vision Ears, nose, mouth, throat, and face: Denies mucositis or sore throat Respiratory: Denies cough, dyspnea or wheezes Cardiovascular: Denies palpitation, chest discomfort Gastrointestinal:  Denies nausea, heartburn or change in bowel habits Skin: Denies abnormal skin rashes Lymphatics: Denies new lymphadenopathy or easy bruising Neurological:Denies numbness, tingling or new weaknesses Behavioral/Psych: Mood is stable, no new changes  Extremities: No lower extremity edema Breast:  denies any pain or lumps or nodules in either breasts All other systems were reviewed with the patient and are negative.  I have reviewed the past medical history, past surgical history, social history and family history with the patient  and they are unchanged from previous note.  ALLERGIES:  is allergic to ciprofloxacin and sulfa antibiotics.  MEDICATIONS:  Current Outpatient Medications  Medication Sig Dispense Refill  . ALPRAZolam (XANAX) 0.5 MG tablet TK 1 T PO Q 6 H PRA  1  . levothyroxine (SYNTHROID, LEVOTHROID) 50 MCG tablet Take 50 mcg by mouth daily before breakfast.    .  lisinopril-hydrochlorothiazide (PRINZIDE,ZESTORETIC) 10-12.5 MG tablet Take 1 tablet by mouth daily.    . tamoxifen (NOLVADEX) 20 MG tablet TAKE 1 TABLET(20 MG) BY MOUTH DAILY 90 tablet 3  . venlafaxine XR (EFFEXOR-XR) 75 MG 24 hr capsule Take 1 capsule (75 mg total) by mouth daily with breakfast. 90 capsule 3   No current facility-administered medications for this visit.     PHYSICAL EXAMINATION: ECOG PERFORMANCE STATUS: 1 - Symptomatic but completely ambulatory  Vitals:   03/03/18 1502  BP: (!) 137/95  Pulse: 96  Resp: 18  Temp: 98.8 F (37.1 C)  SpO2: 99%   Filed Weights   03/03/18 1502  Weight: 181 lb (82.1 kg)    GENERAL:alert, no distress and comfortable SKIN: skin color, texture, turgor are normal, no rashes or significant lesions EYES: normal, Conjunctiva are pink and non-injected, sclera clear OROPHARYNX:no exudate, no erythema and lips, buccal mucosa, and tongue normal  NECK: supple, thyroid normal size, non-tender, without nodularity LYMPH:  no palpable lymphadenopathy in the cervical, axillary or inguinal LUNGS: clear to auscultation and percussion with normal breathing effort HEART: regular rate & rhythm and no murmurs and no lower extremity edema ABDOMEN:abdomen soft, non-tender and normal bowel sounds MUSCULOSKELETAL:no cyanosis of digits and no clubbing  NEURO: alert & oriented x 3 with fluent speech, no focal motor/sensory deficits EXTREMITIES: No lower extremity edema  LABORATORY DATA:  I have reviewed the data as listed CMP Latest Ref Rng & Units 06/19/2016 07/14/2015 04/26/2015  Glucose 65 - 99 mg/dL 157(H) 131 146(H)  BUN 6 - 20 mg/dL 11 12.9 8  Creatinine 0.44 - 1.00 mg/dL 0.84 0.8 0.67  Sodium 135 - 145 mmol/L 135 140 138  Potassium 3.5 - 5.1 mmol/L 3.3(L) 3.1(L) 3.5  Chloride 101 - 111 mmol/L 103 - 101  CO2 22 - 32 mmol/L 22 24 29   Calcium 8.9 - 10.3 mg/dL 10.0 9.8 9.6  Total Protein 6.4 - 8.3 g/dL - 7.6 7.8  Total Bilirubin 0.20 - 1.20 mg/dL -  0.76 0.9  Alkaline Phos 40 - 150 U/L - 60 67  AST 5 - 34 U/L - 14 24  ALT 0 - 55 U/L - 20 29    Lab Results  Component Value Date   WBC 5.4 10/31/2017   HGB 13.6 10/31/2017   HCT 40.1 10/31/2017   MCV 92.2 10/31/2017   PLT 200 10/31/2017   NEUTROABS 3.2 10/31/2017    ASSESSMENT & PLAN:  Breast cancer of upper-outer quadrant of right female breast Right lumpectomy 04/28/2015: Invasive ductal carcinoma grade 1, 0.9 cm, low-grade DCIS, margins negative, 0/6 sentinel nodes negative, ER 98%, PR 98%, HER-2 negative, Ki-67 6%, T1 BN 0 stage IA, Oncotype DX score 10, 7% risk of recurrence, Adj radiation therapy 06/15/2015 to 07/14/15  Current Treatment: Tamoxifen 20 mg daily started 07/30/15  Tamoxifen toxicities: 1. Occasional hot flashes 2. Emotional outbursts: Markedly improved on Effexor  Surveillance: 1. Breast exam 03/03/2018 benign 2. mammogram to be performed on 03/13/2018  Return to clinic in 1 year for follow-up    No orders of the defined types were placed in this encounter.  The patient has a good understanding of the overall plan. she agrees with it. she will call with any problems that may develop before the next visit here.   Harriette Ohara, MD 03/03/18

## 2018-03-03 NOTE — Assessment & Plan Note (Signed)
Right lumpectomy 04/28/2015: Invasive ductal carcinoma grade 1, 0.9 cm, low-grade DCIS, margins negative, 0/6 sentinel nodes negative, ER 98%, PR 98%, HER-2 negative, Ki-67 6%, T1 BN 0 stage IA, Oncotype DX score 10, 7% risk of recurrence, Adj radiation therapy 06/15/2015 to 07/14/15  Current Treatment: Tamoxifen 20 mg daily started 07/30/15  Tamoxifen toxicities: 1. Occasional hot flashes 2. Emotional outbursts: Markedly improved on Effexor  Surveillance: 1. Breast exam 03/03/2018 benign 2. mammogram to be performed on 03/13/2018  Return to clinic in 1 year for follow-up

## 2018-03-03 NOTE — Telephone Encounter (Signed)
Patient declined avs and calendar. Will receive update on mychart.  °

## 2018-03-13 ENCOUNTER — Ambulatory Visit
Admission: RE | Admit: 2018-03-13 | Discharge: 2018-03-13 | Disposition: A | Discharge status: Home / Self Care | Payer: BLUE CROSS/BLUE SHIELD | Source: Ambulatory Visit | Attending: Hematology and Oncology | Admitting: Hematology and Oncology

## 2018-03-13 DIAGNOSIS — R922 Inconclusive mammogram: Secondary | ICD-10-CM | POA: Diagnosis not present

## 2018-03-13 DIAGNOSIS — Z9889 Other specified postprocedural states: Secondary | ICD-10-CM

## 2018-03-25 DIAGNOSIS — M25512 Pain in left shoulder: Secondary | ICD-10-CM | POA: Diagnosis not present

## 2018-04-29 DIAGNOSIS — Z6829 Body mass index (BMI) 29.0-29.9, adult: Secondary | ICD-10-CM | POA: Diagnosis not present

## 2018-04-29 DIAGNOSIS — Z01419 Encounter for gynecological examination (general) (routine) without abnormal findings: Secondary | ICD-10-CM | POA: Diagnosis not present

## 2018-04-29 DIAGNOSIS — Z1212 Encounter for screening for malignant neoplasm of rectum: Secondary | ICD-10-CM | POA: Diagnosis not present

## 2018-05-17 DIAGNOSIS — B001 Herpesviral vesicular dermatitis: Secondary | ICD-10-CM | POA: Diagnosis not present

## 2018-07-01 DIAGNOSIS — L739 Follicular disorder, unspecified: Secondary | ICD-10-CM | POA: Diagnosis not present

## 2018-07-30 DIAGNOSIS — Z23 Encounter for immunization: Secondary | ICD-10-CM | POA: Diagnosis not present

## 2018-09-19 ENCOUNTER — Other Ambulatory Visit: Payer: Self-pay | Admitting: Physician Assistant

## 2018-09-19 DIAGNOSIS — R140 Abdominal distension (gaseous): Secondary | ICD-10-CM | POA: Diagnosis not present

## 2018-09-19 DIAGNOSIS — R197 Diarrhea, unspecified: Secondary | ICD-10-CM | POA: Diagnosis not present

## 2018-09-19 DIAGNOSIS — R1011 Right upper quadrant pain: Secondary | ICD-10-CM | POA: Diagnosis not present

## 2018-09-19 DIAGNOSIS — K644 Residual hemorrhoidal skin tags: Secondary | ICD-10-CM | POA: Diagnosis not present

## 2018-09-22 ENCOUNTER — Ambulatory Visit
Admission: RE | Admit: 2018-09-22 | Discharge: 2018-09-22 | Disposition: A | Discharge status: Home / Self Care | Payer: BLUE CROSS/BLUE SHIELD | Source: Ambulatory Visit | Attending: Physician Assistant | Admitting: Physician Assistant

## 2018-09-22 DIAGNOSIS — R1011 Right upper quadrant pain: Secondary | ICD-10-CM

## 2018-09-22 DIAGNOSIS — K760 Fatty (change of) liver, not elsewhere classified: Secondary | ICD-10-CM | POA: Diagnosis not present

## 2018-09-22 DIAGNOSIS — R140 Abdominal distension (gaseous): Secondary | ICD-10-CM

## 2018-09-24 ENCOUNTER — Other Ambulatory Visit: Payer: Self-pay | Admitting: Physician Assistant

## 2018-09-24 DIAGNOSIS — M549 Dorsalgia, unspecified: Secondary | ICD-10-CM

## 2018-09-24 DIAGNOSIS — R1011 Right upper quadrant pain: Secondary | ICD-10-CM

## 2018-09-24 DIAGNOSIS — N289 Disorder of kidney and ureter, unspecified: Secondary | ICD-10-CM

## 2018-10-01 ENCOUNTER — Ambulatory Visit
Admission: RE | Admit: 2018-10-01 | Discharge: 2018-10-01 | Disposition: A | Discharge status: Home / Self Care | Payer: BLUE CROSS/BLUE SHIELD | Source: Ambulatory Visit | Attending: Physician Assistant | Admitting: Physician Assistant

## 2018-10-01 DIAGNOSIS — R1011 Right upper quadrant pain: Secondary | ICD-10-CM

## 2018-10-01 DIAGNOSIS — M549 Dorsalgia, unspecified: Secondary | ICD-10-CM

## 2018-10-01 DIAGNOSIS — N289 Disorder of kidney and ureter, unspecified: Secondary | ICD-10-CM

## 2018-10-01 DIAGNOSIS — K760 Fatty (change of) liver, not elsewhere classified: Secondary | ICD-10-CM | POA: Diagnosis not present

## 2018-10-01 MED ORDER — IOPAMIDOL (ISOVUE-300) INJECTION 61%
100.0000 mL | Freq: Once | INTRAVENOUS | Status: AC | PRN
  Administered 2018-10-01: 100 mL via INTRAVENOUS

## 2018-10-05 IMAGING — MG 2D DIGITAL DIAGNOSTIC BILATERAL MAMMOGRAM WITH CAD AND ADJUNCT T
8 of 14 series · 8 of 30 positions shown · non-contrast
Comparison: Previous exam(s).

CLINICAL DATA: 46-year-old female presenting for annual evaluation
status post right breast lumpectomy in 7929.

EXAM:
2D DIGITAL DIAGNOSTIC BILATERAL MAMMOGRAM WITH CAD AND ADJUNCT TOMO

[R CC (1 of 2)]
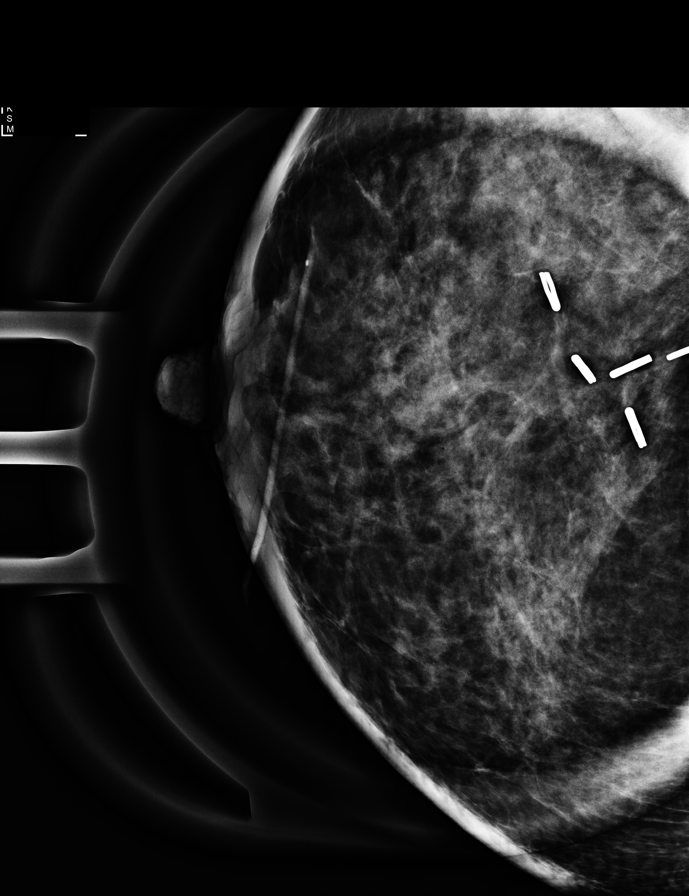

[R MLO (1 of 2)]
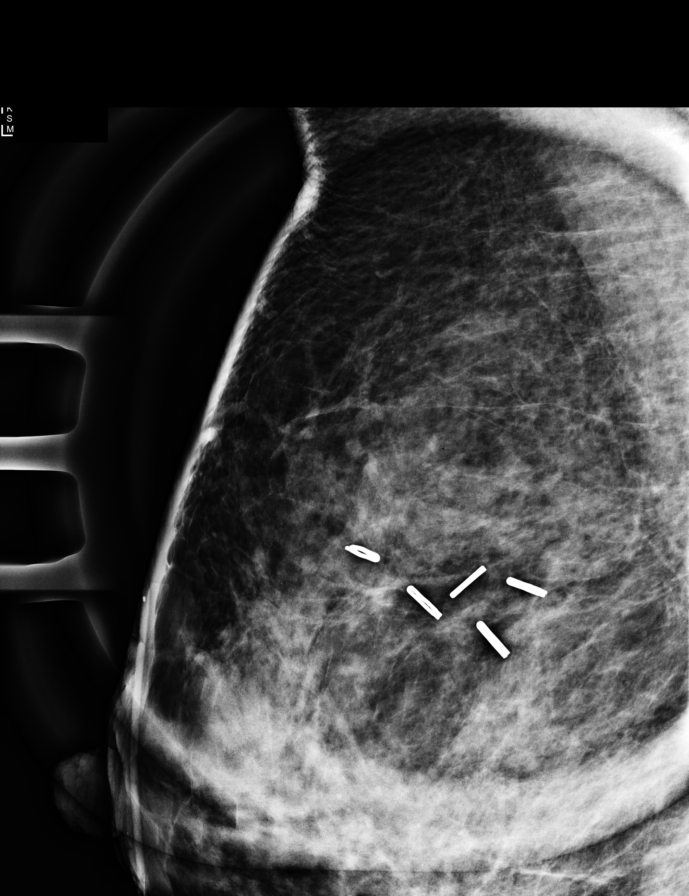

[L CC synth-2D]
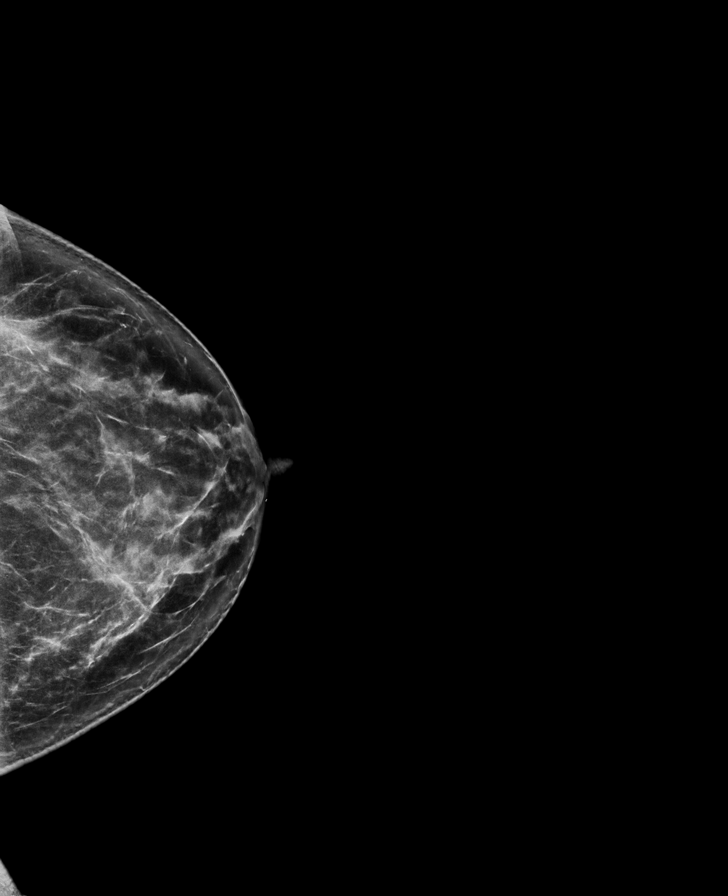

[R MLO (2 of 2)]
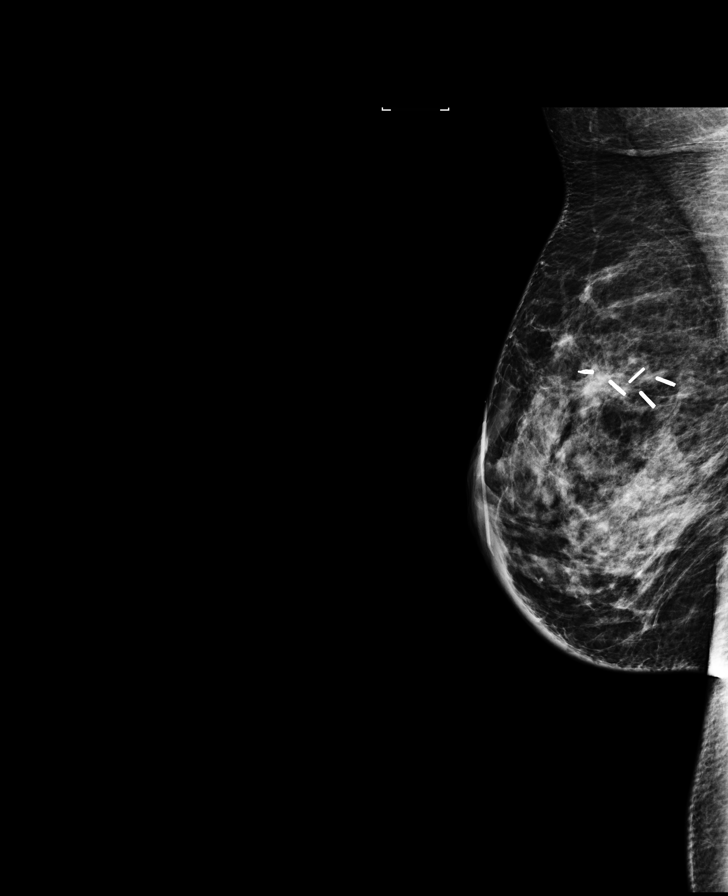

[R CC (2 of 2)]
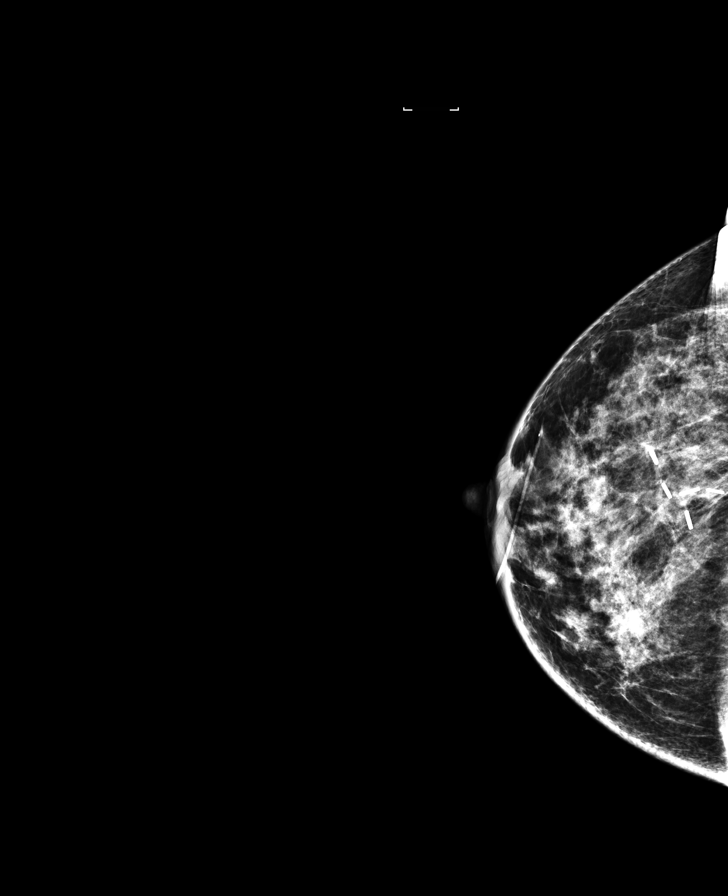

[L MLO]
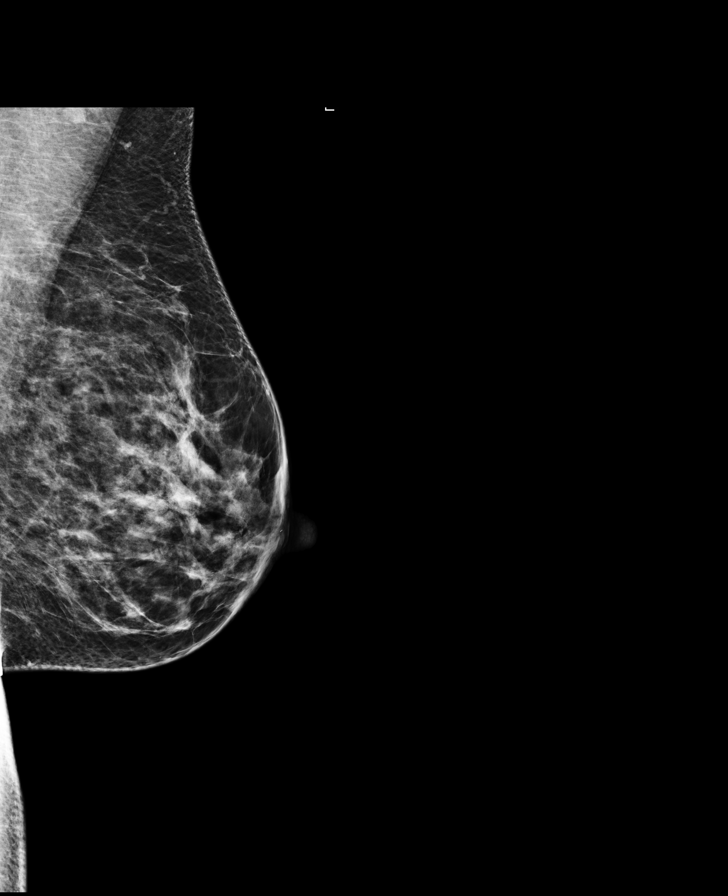

[L CC]
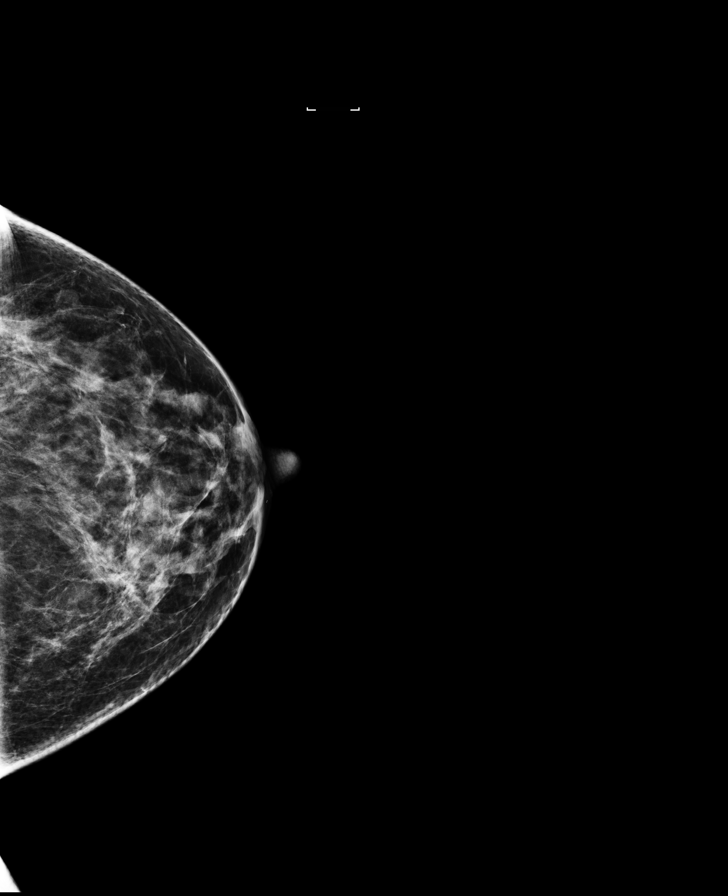

[R CC synth-2D]
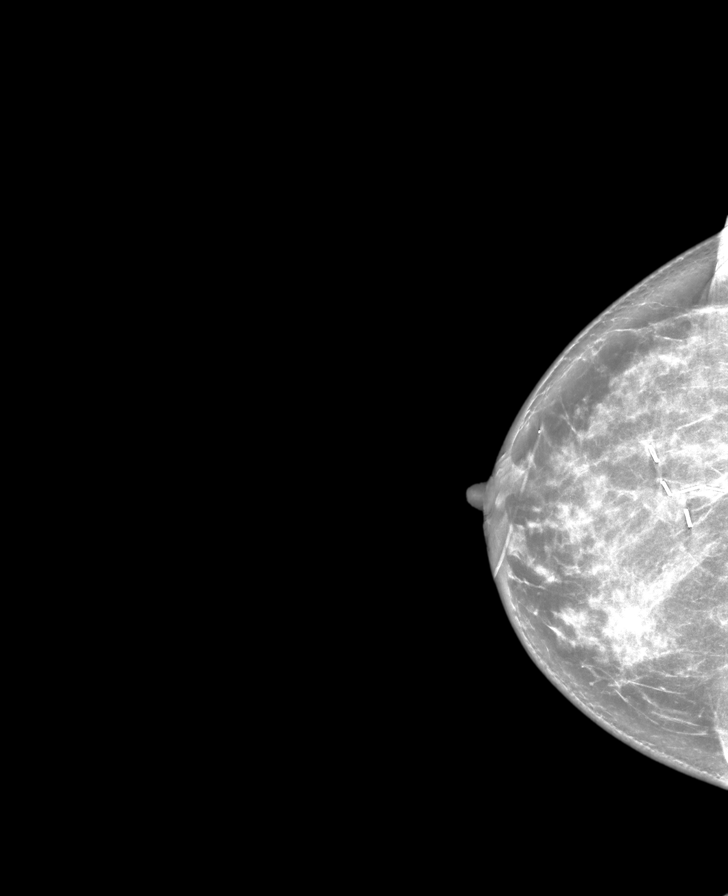

[8 of 30 positions shown; findings below may reference images not displayed]

ACR Breast Density Category c: The breast tissue is heterogeneously
dense, which may obscure small masses.
FINDINGS: Stable right breast lumpectomy site. No suspicious calcifications,
masses or areas of distortion are seen in the bilateral breasts.

Mammographic images were processed with CAD.
IMPRESSION: Stable right breast lumpectomy site. No mammographic evidence of
malignancy in the bilateral breasts.

RECOMMENDATION:
Diagnostic mammogram is suggested in 1 year. (Code:PO-[id number])

I have discussed the findings and recommendations with the patient.
Results were also provided in writing at the conclusion of the
visit. If applicable, a reminder letter will be sent to the patient
regarding the next appointment.

BI-RADS CATEGORY  2: Benign.

## 2018-10-08 ENCOUNTER — Other Ambulatory Visit (HOSPITAL_COMMUNITY): Payer: Self-pay | Admitting: Physician Assistant

## 2018-10-08 DIAGNOSIS — R12 Heartburn: Secondary | ICD-10-CM | POA: Diagnosis not present

## 2018-10-08 DIAGNOSIS — R1011 Right upper quadrant pain: Secondary | ICD-10-CM | POA: Diagnosis not present

## 2018-10-08 DIAGNOSIS — K760 Fatty (change of) liver, not elsewhere classified: Secondary | ICD-10-CM | POA: Diagnosis not present

## 2018-10-08 DIAGNOSIS — R1013 Epigastric pain: Secondary | ICD-10-CM

## 2018-10-13 DIAGNOSIS — K2930 Chronic superficial gastritis without bleeding: Secondary | ICD-10-CM | POA: Diagnosis not present

## 2018-10-13 DIAGNOSIS — K2900 Acute gastritis without bleeding: Secondary | ICD-10-CM | POA: Diagnosis not present

## 2018-10-13 DIAGNOSIS — R12 Heartburn: Secondary | ICD-10-CM | POA: Diagnosis not present

## 2018-10-13 DIAGNOSIS — R1011 Right upper quadrant pain: Secondary | ICD-10-CM | POA: Diagnosis not present

## 2018-10-15 ENCOUNTER — Encounter (HOSPITAL_COMMUNITY)
Admission: RE | Admit: 2018-10-15 | Discharge: 2018-10-15 | Disposition: A | Discharge status: Home / Self Care | Payer: BLUE CROSS/BLUE SHIELD | Source: Ambulatory Visit | Attending: Physician Assistant | Admitting: Physician Assistant

## 2018-10-15 DIAGNOSIS — R1013 Epigastric pain: Secondary | ICD-10-CM | POA: Insufficient documentation

## 2018-10-15 DIAGNOSIS — R1011 Right upper quadrant pain: Secondary | ICD-10-CM | POA: Diagnosis not present

## 2018-10-15 DIAGNOSIS — R110 Nausea: Secondary | ICD-10-CM | POA: Diagnosis not present

## 2018-10-15 MED ORDER — TECHNETIUM TC 99M MEBROFENIN IV KIT
4.4000 | PACK | Freq: Once | INTRAVENOUS | Status: AC | PRN
  Administered 2018-10-15: 4.4 via INTRAVENOUS

## 2018-10-17 DIAGNOSIS — K2930 Chronic superficial gastritis without bleeding: Secondary | ICD-10-CM | POA: Diagnosis not present

## 2018-10-21 ENCOUNTER — Ambulatory Visit (HOSPITAL_COMMUNITY): Payer: BLUE CROSS/BLUE SHIELD

## 2018-10-31 DIAGNOSIS — R1011 Right upper quadrant pain: Secondary | ICD-10-CM | POA: Diagnosis not present

## 2018-10-31 DIAGNOSIS — R140 Abdominal distension (gaseous): Secondary | ICD-10-CM | POA: Diagnosis not present

## 2018-10-31 DIAGNOSIS — K760 Fatty (change of) liver, not elsewhere classified: Secondary | ICD-10-CM | POA: Diagnosis not present

## 2018-10-31 DIAGNOSIS — K2950 Unspecified chronic gastritis without bleeding: Secondary | ICD-10-CM | POA: Diagnosis not present

## 2018-11-04 ENCOUNTER — Encounter: Payer: Self-pay | Admitting: Hematology and Oncology

## 2018-11-12 DIAGNOSIS — M5412 Radiculopathy, cervical region: Secondary | ICD-10-CM | POA: Diagnosis not present

## 2019-01-30 DIAGNOSIS — R635 Abnormal weight gain: Secondary | ICD-10-CM | POA: Diagnosis not present

## 2019-01-30 DIAGNOSIS — E282 Polycystic ovarian syndrome: Secondary | ICD-10-CM | POA: Diagnosis not present

## 2019-01-30 DIAGNOSIS — I10 Essential (primary) hypertension: Secondary | ICD-10-CM | POA: Diagnosis not present

## 2019-01-30 DIAGNOSIS — E039 Hypothyroidism, unspecified: Secondary | ICD-10-CM | POA: Diagnosis not present

## 2019-01-30 DIAGNOSIS — R7301 Impaired fasting glucose: Secondary | ICD-10-CM | POA: Diagnosis not present

## 2019-02-24 NOTE — Assessment & Plan Note (Deleted)
Right lumpectomy 04/28/2015: Invasive ductal carcinoma grade 1, 0.9 cm, low-grade DCIS, margins negative, 0/6 sentinel nodes negative, ER 98%, PR 98%, HER-2 negative, Ki-67 6%, T1 BN 0 stage IA, Oncotype DX score 10, 7% risk of recurrence, Adj radiation therapy 06/15/2015 to 07/14/15  Current Treatment: Tamoxifen 20 mg daily started 07/30/15  Tamoxifen toxicities: 1. Occasional hot flashes 2. Emotional outbursts: Markedly improved on Effexor  Surveillance: 1. Breast exam 03/03/2018 benign 2. mammogram to be performed on 03/13/2018  Return to clinic in 1 year for follow-up 

## 2019-02-26 ENCOUNTER — Telehealth: Payer: Self-pay | Admitting: Hematology and Oncology

## 2019-02-26 NOTE — Telephone Encounter (Signed)
Attempted to call pt re: 03/02/19 appt to change to [facility] mtg. But voice mail was full and unable to leave message.

## 2019-02-27 ENCOUNTER — Encounter: Payer: Self-pay | Admitting: Hematology and Oncology

## 2019-03-02 ENCOUNTER — Inpatient Hospital Stay: Payer: 59 | Admitting: Hematology and Oncology

## 2019-03-06 ENCOUNTER — Other Ambulatory Visit: Payer: Self-pay | Admitting: Hematology and Oncology

## 2019-03-06 DIAGNOSIS — Z853 Personal history of malignant neoplasm of breast: Secondary | ICD-10-CM

## 2019-04-17 ENCOUNTER — Other Ambulatory Visit: Payer: Self-pay

## 2019-04-17 ENCOUNTER — Ambulatory Visit
Admission: RE | Admit: 2019-04-17 | Discharge: 2019-04-17 | Disposition: A | Discharge status: Home / Self Care | Payer: 59 | Source: Ambulatory Visit | Attending: Hematology and Oncology | Admitting: Hematology and Oncology

## 2019-04-17 DIAGNOSIS — Z853 Personal history of malignant neoplasm of breast: Secondary | ICD-10-CM

## 2019-04-22 ENCOUNTER — Other Ambulatory Visit: Payer: Self-pay | Admitting: Hematology and Oncology

## 2019-04-30 ENCOUNTER — Encounter: Payer: Self-pay | Admitting: Genetic Counselor

## 2019-04-30 NOTE — Progress Notes (Signed)
UPDATE:  MSH2 c.2164G>A VUS was amended to Variant, Likely Benign on 04/28/2019 due to internal data.

## 2019-07-07 ENCOUNTER — Other Ambulatory Visit: Payer: Self-pay | Admitting: Hematology and Oncology

## 2019-07-07 ENCOUNTER — Telehealth: Payer: Self-pay | Admitting: Hematology and Oncology

## 2019-07-07 DIAGNOSIS — C50411 Malignant neoplasm of upper-outer quadrant of right female breast: Secondary | ICD-10-CM

## 2019-07-07 DIAGNOSIS — Z170 Estrogen receptor positive status [ER+]: Secondary | ICD-10-CM

## 2019-07-07 NOTE — Telephone Encounter (Signed)
Called pt per 9/8 sch message - unable to reach pt . Left message with appointment date and time

## 2019-09-06 NOTE — Progress Notes (Signed)
Patient Care Team: Carol Ada, MD as PCP - General (Family Medicine) Nicholas Lose, MD as Consulting Physician (Hematology and Oncology) Thea Silversmith, MD as Consulting Physician (Radiation Oncology) [name], MD as Consulting Physician (General Surgery) Sylvan Cheese, NP as Nurse Practitioner (Hematology and Oncology)  DIAGNOSIS:    ICD-10-CM   1. Abdominal cramping  R10.9 US Abdomen Limited  2. Malignant neoplasm of upper-outer quadrant of right breast in female, estrogen receptor positive ([place])  C50.411 tamoxifen (NOLVADEX) 20 MG tablet   Z17.0     SUMMARY OF ONCOLOGIC HISTORY: Oncology History  Breast cancer of upper-outer quadrant of right female breast ([place])  03/16/2015 Mammogram   Right breast: mass   03/16/2015 Breast US   Right breast: hypoechoic irregular shadowing mass at 12 o'clock, 4 cm from the nipple measuring 1.4 x 1.3 x 1.2 cm. Right axillary lymph nodes with smooth cortical thickness   03/22/2015 Initial Biopsy   Right breast biopsy: Invasive ductal carcinoma with calcifications; ER+ (98%), PR+ (98%), Ki-67 6%, HER-2 negative (ratio 1.35)   04/04/2015 Breast MRI   Right breast: 2.0 cm hematoma/ seroma within the upper central portion of the breast following recent stereotactic guided core biopsy   04/05/2015 Clinical Stage   Stage IA: T1c N0   04/06/2015 Procedure   Genetics: OvaNext panel revealed  VUTS at [place] (c.2164G>A) otherwise negative at ATM, BARD1, BRCA1, BRCA2, BRIP1, CDH1, CHEK2, EPCAM, MLH1, MRE11A, MSH6, MUTYH, NBN, NF1, PALB2, PMS2, PTEN, RAD50, RAD51C, RAD51D, SMARCA4, STK11, and TP53.   UPDATE:  MSH2 c.2164G>A VUS was amended to Variant, Likely Benign on 04/28/2019 due to internal data.   04/28/2015 Definitive Surgery   Right lumpectomy/SLNB [name]): Invasive ductal carcinoma grade 1, 0.9 cm, low-grade DCIS, margins negative, 0/6 sentinel nodes negative, ER 98%, PR 98%, HER-2 negative (ratio 1.29), Ki-67 6%,  Oncotype  DX score 10 (7% ROR)   04/28/2015 Pathologic Stage   Stage IA (T1b N0)    04/28/2015 Oncotype testing   Score 10 (7% ROR)   06/15/2015 - 07/14/2015 Radiation Therapy   Adjuvant RT [name]): Right breast 42.72 Gy over 21 fractions; right breast boost 10 G over 5 fractions   07/14/2015 -  Anti-estrogen oral therapy   Tamoxifen 20 mg daily. Planned duration of therapy 5-10 years.   09/01/2015 Survivorship   Survivorship care plan completed and given to patient.     CHIEF COMPLIANT: Follow-up of right breast cancer on tamoxifen therapy  INTERVAL HISTORY: [name] is a 48 y.o. with above-mentioned history of right breast cancer treated with lumpectomy, radiation, and who is currently on tamoxifen. I last saw her a year ago. Mammogram on 04/17/19 showed no evidence of malignancy bilaterally. She presents to the clinic today for follow-up.  She was diagnosed with diabetes and is taking Jardiance.  She had lost 20 pounds and with that her A1c has come down significantly.  Her biggest complaint today is right upper quadrant abdominal pain.  Last year for the same symptoms she had CT of the abdomen ultrasound and a HIDA scan all of which did not show any abnormality other than fatty liver.  She continues to suffer from this pain and it is not related to any food.  She has extensive family history of gallbladder issues.  REVIEW OF SYSTEMS:   Constitutional: Denies fevers, chills or abnormal weight loss Eyes: Denies blurriness of vision Ears, nose, mouth, throat, and face: Denies mucositis or sore throat Respiratory: Denies cough, dyspnea or wheezes Cardiovascular: Denies  palpitation, chest discomfort Gastrointestinal: Right upper quadrant abdominal pain Skin: Denies abnormal skin rashes Lymphatics: Denies new lymphadenopathy or easy bruising Neurological: Denies numbness, tingling or new weaknesses Behavioral/Psych: Mood is stable, no new changes  Extremities: No lower extremity edema  Breast: denies any pain or lumps or nodules in either breasts All other systems were reviewed with the patient and are negative.  I have reviewed the past medical history, past surgical history, social history and family history with the patient and they are unchanged from previous note.  ALLERGIES:  is allergic to ciprofloxacin and sulfa antibiotics.  MEDICATIONS:  Current Outpatient Medications  Medication Sig Dispense Refill  . ALPRAZolam (XANAX) 0.5 MG tablet TK 1 T PO Q 6 H PRA  1  . levothyroxine (SYNTHROID, LEVOTHROID) 50 MCG tablet Take 50 mcg by mouth daily before breakfast.    . lisinopril-hydrochlorothiazide (PRINZIDE,ZESTORETIC) 10-12.5 MG tablet Take 1 tablet by mouth daily.    . tamoxifen (NOLVADEX) 20 MG tablet Once daily 90 tablet 1  . venlafaxine XR (EFFEXOR-XR) 75 MG 24 hr capsule TAKE 1 CAPSULE(75 MG) BY MOUTH DAILY WITH BREAKFAST 90 capsule 3   No current facility-administered medications for this visit.     PHYSICAL EXAMINATION: ECOG PERFORMANCE STATUS: 1 - Symptomatic but completely ambulatory  Vitals:   09/07/19 1548  BP: (!) 150/115  Pulse: 87  Resp: 18  Temp: 98.2 F (36.8 C)  SpO2: 98%   Filed Weights   09/07/19 1548  Weight: 177 lb 6.4 oz (80.5 kg)    GENERAL: alert, no distress and comfortable SKIN: skin color, texture, turgor are normal, no rashes or significant lesions EYES: normal, Conjunctiva are pink and non-injected, sclera clear OROPHARYNX: no exudate, no erythema and lips, buccal mucosa, and tongue normal  NECK: supple, thyroid normal size, non-tender, without nodularity LYMPH: no palpable lymphadenopathy in the cervical, axillary or inguinal LUNGS: clear to auscultation and percussion with normal breathing effort HEART: regular rate & rhythm and no murmurs and no lower extremity edema ABDOMEN: Murphy sign positive MUSCULOSKELETAL: no cyanosis of digits and no clubbing  NEURO: alert & oriented x 3 with fluent speech, no focal  motor/sensory deficits EXTREMITIES: No lower extremity edema BREAST: No palpable masses or nodules in either right or left breasts. No palpable axillary supraclavicular or infraclavicular adenopathy no breast tenderness or nipple discharge. (exam performed in the presence of a chaperone)  LABORATORY DATA:  I have reviewed the data as listed CMP Latest Ref Rng & Units 06/19/2016 07/14/2015 04/26/2015  Glucose 65 - 99 mg/dL 157(H) 131 146(H)  BUN 6 - 20 mg/dL 11 12.9 8  Creatinine 0.44 - 1.00 mg/dL 0.84 0.8 0.67  Sodium 135 - 145 mmol/L 135 140 138  Potassium 3.5 - 5.1 mmol/L 3.3(L) 3.1(L) 3.5  Chloride 101 - 111 mmol/L 103 - 101  CO2 22 - 32 mmol/L _0 Calcium 8.9 - 10.3 mg/dL 10.0 9.8 9.6  Total Protein 6.4 - 8.3 g/dL - 7.6 7.8  Total Bilirubin 0.20 - 1.20 mg/dL - 0.76 0.9  Alkaline Phos 40 - 150 U/L - 60 67  AST 5 - 34 U/L - 14 24  ALT 0 - 55 U/L - 20 29    Lab Results  Component Value Date   WBC 5.4 10/31/2017   HGB 13.6 10/31/2017   HCT 40.1 10/31/2017   MCV 92.2 10/31/2017   PLT 200 10/31/2017   NEUTROABS 3.2 10/31/2017    ASSESSMENT & PLAN:  Breast cancer of upper-outer quadrant  of right female breast Right lumpectomy 04/28/2015: Invasive ductal carcinoma grade 1, 0.9 cm, low-grade DCIS, margins negative, 0/6 sentinel nodes negative, ER 98%, PR 98%, HER-2 negative, Ki-67 6%, T1 BN 0 stage IA, Oncotype DX score 10, 7% risk of recurrence, Adj radiation therapy 06/15/2015 to 07/14/15  Current Treatment: Tamoxifen 20 mg daily started 07/30/15  Tamoxifen toxicities: 1. Occasional hot flashes 2. 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   Surveillance: 1. Breast exam  09/07/2019 benign 2. mammogram  04/17/2019: Benign breast density category C  Right upper quadrant abdominal pain: Ultrasound of the abdomen will be ordered.  I will call her with this result. Return to clinic in  1 year for follow-up    Orders Placed This Encounter  Procedures  . US Abdomen Limited    Standing Status:   Future    Standing Expiration Date:   09/06/2020    Order Specific Question:   Reason for Exam (SYMPTOM  OR DIAGNOSIS REQUIRED)    Answer:   Ultrasound Right upper quadrant for RUQ pain    Order Specific Question:   Preferred imaging location?    Answer:   [facility]   The patient has a good understanding of the overall plan. she agrees with it. she will call with any problems that may develop before the next visit here.  Nicholas Lose, MD 09/07/2019  Julious Oka Dorshimer am acting as scribe for Dr. Nicholas Lose.  I have reviewed the above documentation for accuracy and completeness, and I agree with the above.

## 2019-09-07 ENCOUNTER — Inpatient Hospital Stay: Payer: 59 | Attending: Hematology and Oncology | Admitting: Hematology and Oncology

## 2019-09-07 ENCOUNTER — Other Ambulatory Visit: Payer: Self-pay

## 2019-09-07 VITALS — BP 150/115 | HR 87 | Temp 98.2°F | Resp 18 | Ht 66.0 in | Wt 177.4 lb

## 2019-09-07 DIAGNOSIS — Z170 Estrogen receptor positive status [ER+]: Secondary | ICD-10-CM

## 2019-09-07 DIAGNOSIS — C50411 Malignant neoplasm of upper-outer quadrant of right female breast: Secondary | ICD-10-CM | POA: Diagnosis not present

## 2019-09-07 DIAGNOSIS — Z79899 Other long term (current) drug therapy: Secondary | ICD-10-CM | POA: Insufficient documentation

## 2019-09-07 DIAGNOSIS — Z79810 Long term (current) use of selective estrogen receptor modulators (SERMs): Secondary | ICD-10-CM | POA: Insufficient documentation

## 2019-09-07 DIAGNOSIS — R1011 Right upper quadrant pain: Secondary | ICD-10-CM | POA: Insufficient documentation

## 2019-09-07 DIAGNOSIS — Z923 Personal history of irradiation: Secondary | ICD-10-CM | POA: Diagnosis not present

## 2019-09-07 DIAGNOSIS — N951 Menopausal and female climacteric states: Secondary | ICD-10-CM | POA: Insufficient documentation

## 2019-09-07 DIAGNOSIS — R109 Unspecified abdominal pain: Secondary | ICD-10-CM

## 2019-09-07 DIAGNOSIS — E119 Type 2 diabetes mellitus without complications: Secondary | ICD-10-CM | POA: Insufficient documentation

## 2019-09-07 MED ORDER — TAMOXIFEN CITRATE 20 MG PO TABS: ORAL_TABLET | ORAL | 1 refills | Status: DC

## 2019-09-07 NOTE — Assessment & Plan Note (Signed)
Right lumpectomy 04/28/2015: Invasive ductal carcinoma grade 1, 0.9 cm, low-grade DCIS, margins negative, 0/6 sentinel nodes negative, ER 98%, PR 98%, HER-2 negative, Ki-67 6%, T1 BN 0 stage IA, Oncotype DX score 10, 7% risk of recurrence, Adj radiation therapy 06/15/2015 to 07/14/15  Current Treatment: Tamoxifen 20 mg daily started 07/30/15  Tamoxifen toxicities: 1. Occasional hot flashes 2. Emotional outbursts: Markedly improved on Effexor  Surveillance: 1. Breast exam  09/07/2019 benign 2. mammogram  04/17/2019: Benign breast density category C  Return to clinic in 1 year for follow-up

## 2019-09-08 ENCOUNTER — Telehealth: Payer: Self-pay | Admitting: Hematology and Oncology

## 2019-09-08 NOTE — Telephone Encounter (Signed)
I left a message regarding schedule  

## 2019-09-14 ENCOUNTER — Telehealth: Payer: Self-pay | Admitting: Hematology and Oncology

## 2019-09-14 ENCOUNTER — Other Ambulatory Visit: Payer: Self-pay

## 2019-09-14 ENCOUNTER — Ambulatory Visit (HOSPITAL_COMMUNITY)
Admission: RE | Admit: 2019-09-14 | Discharge: 2019-09-14 | Disposition: A | Discharge status: Home / Self Care | Payer: 59 | Source: Ambulatory Visit | Attending: Hematology and Oncology | Admitting: Hematology and Oncology

## 2019-09-14 DIAGNOSIS — R109 Unspecified abdominal pain: Secondary | ICD-10-CM

## 2019-09-14 NOTE — Telephone Encounter (Signed)
I informed her that the ultrasound of the gallbladder showed fatty liver.  No gallbladder problems were noted.

## 2019-12-28 ENCOUNTER — Telehealth: Payer: Self-pay | Admitting: *Deleted

## 2019-12-28 ENCOUNTER — Telehealth: Payer: Self-pay | Admitting: Hematology and Oncology

## 2019-12-28 DIAGNOSIS — C50411 Malignant neoplasm of upper-outer quadrant of right female breast: Secondary | ICD-10-CM

## 2019-12-28 DIAGNOSIS — Z170 Estrogen receptor positive status [ER+]: Secondary | ICD-10-CM

## 2019-12-28 NOTE — Telephone Encounter (Signed)
Scheduled appt per 3/1 sch msg. Pt is aware of appt date and time.

## 2019-12-28 NOTE — Telephone Encounter (Signed)
Received call from pt stating over the last several months she is experiencing increase in bruising without injury.  Pt states she has also experienced several low grade fevers with no other symptom of being sick.  Per MD pt to be seen in the clinic within the next 2 weeks for further evaluation.

## 2019-12-30 ENCOUNTER — Inpatient Hospital Stay: Payer: 59 | Attending: Hematology and Oncology | Admitting: Hematology and Oncology

## 2019-12-30 ENCOUNTER — Inpatient Hospital Stay: Payer: 59

## 2019-12-30 ENCOUNTER — Other Ambulatory Visit: Payer: Self-pay

## 2019-12-30 ENCOUNTER — Telehealth: Payer: Self-pay | Admitting: *Deleted

## 2019-12-30 VITALS — BP 128/96 | HR 95 | Temp 98.9°F | Resp 20 | Ht 66.0 in | Wt 178.2 lb

## 2019-12-30 DIAGNOSIS — R238 Other skin changes: Secondary | ICD-10-CM

## 2019-12-30 DIAGNOSIS — Z923 Personal history of irradiation: Secondary | ICD-10-CM | POA: Diagnosis not present

## 2019-12-30 DIAGNOSIS — R1011 Right upper quadrant pain: Secondary | ICD-10-CM | POA: Insufficient documentation

## 2019-12-30 DIAGNOSIS — C50411 Malignant neoplasm of upper-outer quadrant of right female breast: Secondary | ICD-10-CM

## 2019-12-30 DIAGNOSIS — N951 Menopausal and female climacteric states: Secondary | ICD-10-CM | POA: Diagnosis not present

## 2019-12-30 DIAGNOSIS — Z79810 Long term (current) use of selective estrogen receptor modulators (SERMs): Secondary | ICD-10-CM | POA: Insufficient documentation

## 2019-12-30 DIAGNOSIS — Z170 Estrogen receptor positive status [ER+]: Secondary | ICD-10-CM | POA: Diagnosis not present

## 2019-12-30 DIAGNOSIS — Z79899 Other long term (current) drug therapy: Secondary | ICD-10-CM | POA: Diagnosis not present

## 2019-12-30 DIAGNOSIS — R233 Spontaneous ecchymoses: Secondary | ICD-10-CM

## 2019-12-30 LAB — CBC WITH DIFFERENTIAL (CANCER CENTER ONLY)
Abs Immature Granulocytes: 0.02 10*3/uL (ref 0.00–0.07)
Basophils Absolute: 0.1 10*3/uL (ref 0.0–0.1)
Basophils Relative: 1 %
Eosinophils Absolute: 0.1 10*3/uL (ref 0.0–0.5)
Eosinophils Relative: 1 %
HCT: 44.4 % (ref 36.0–46.0)
Hemoglobin: 15.1 g/dL — ABNORMAL HIGH (ref 12.0–15.0)
Immature Granulocytes: 0 %
Lymphocytes Relative: 25 %
Lymphs Abs: 1.8 10*3/uL (ref 0.7–4.0)
MCH: 31.7 pg (ref 26.0–34.0)
MCHC: 34 g/dL (ref 30.0–36.0)
MCV: 93.1 fL (ref 80.0–100.0)
Monocytes Absolute: 0.5 10*3/uL (ref 0.1–1.0)
Monocytes Relative: 6 %
Neutro Abs: 4.7 10*3/uL (ref 1.7–7.7)
Neutrophils Relative %: 67 %
Platelet Count: 211 10*3/uL (ref 150–400)
RBC: 4.77 MIL/uL (ref 3.87–5.11)
RDW: 11.9 % (ref 11.5–15.5)
WBC Count: 7.1 10*3/uL (ref 4.0–10.5)
nRBC: 0 % (ref 0.0–0.2)

## 2019-12-30 LAB — PLATELET FUNCTION ASSAY
Collagen / ADP: 113 seconds (ref 0–118)
Collagen / Epinephrine: 220 seconds — ABNORMAL HIGH (ref 0–193)

## 2019-12-30 LAB — CMP (CANCER CENTER ONLY)
ALT: 31 U/L (ref 0–44)
AST: 19 U/L (ref 15–41)
Albumin: 4.7 g/dL (ref 3.5–5.0)
Alkaline Phosphatase: 76 U/L (ref 38–126)
Anion gap: 10 (ref 5–15)
BUN: 12 mg/dL (ref 6–20)
CO2: 26 mmol/L (ref 22–32)
Calcium: 9.4 mg/dL (ref 8.9–10.3)
Chloride: 104 mmol/L (ref 98–111)
Creatinine: 0.83 mg/dL (ref 0.44–1.00)
GFR, Est AFR Am: >60 mL/min (ref >60)
GFR, Estimated: >60 mL/min (ref >60)
Glucose, Bld: 143 mg/dL — ABNORMAL HIGH (ref 70–99)
Potassium: 4.5 mmol/L (ref 3.5–5.1)
Sodium: 140 mmol/L (ref 135–145)
Total Bilirubin: 0.4 mg/dL (ref 0.3–1.2)
Total Protein: 7.8 g/dL (ref 6.5–8.1)

## 2019-12-30 LAB — PROTIME-INR
INR: 0.9 (ref 0.8–1.2)
Prothrombin Time: 11.6 seconds (ref 11.4–15.2)

## 2019-12-30 LAB — APTT: aPTT: 32 seconds (ref 24–36)

## 2019-12-30 NOTE — Assessment & Plan Note (Signed)
Right lumpectomy 04/28/2015: Invasive ductal carcinoma grade 1, 0.9 cm, low-grade DCIS, margins negative, 0/6 sentinel nodes negative, ER 98%, PR 98%, HER-2 negative, Ki-67 6%, T1 BN 0 stage IA, Oncotype DX score 10, 7% risk of recurrence, Adj radiation therapy 06/15/2015 to 07/14/15  Current Treatment: Tamoxifen 20 mg daily started 07/30/15  Tamoxifen toxicities: 1. Occasional hot flashes 2. 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   Surveillance: 1. Breast exam 11/9/2020benign 2. mammogram 04/17/2019: Benign breast density category C  Right upper quadrant abdominal pain: Ultrasound of the abdomen will be ordered.  I will call her with this result. Easy bruising and intermittent fevers:

## 2019-12-30 NOTE — Progress Notes (Signed)
Patient Care Team: Carol Ada, MD as PCP - General (Family Medicine) Nicholas Lose, MD as Consulting Physician (Hematology and Oncology) Thea Silversmith, MD as Consulting Physician (Radiation Oncology) [name], MD as Consulting Physician (General Surgery) Sylvan Cheese, NP as Nurse Practitioner (Hematology and Oncology)  DIAGNOSIS:    ICD-10-CM   1. Malignant neoplasm of upper-outer quadrant of right breast in female, estrogen receptor positive ([place])  C50.411    Z17.0     SUMMARY OF ONCOLOGIC HISTORY: Oncology History  Breast cancer of upper-outer quadrant of right female breast ([place])  03/16/2015 Mammogram   Right breast: mass   03/16/2015 Breast US   Right breast: hypoechoic irregular shadowing mass at 12 o'clock, 4 cm from the nipple measuring 1.4 x 1.3 x 1.2 cm. Right axillary lymph nodes with smooth cortical thickness   03/22/2015 Initial Biopsy   Right breast biopsy: Invasive ductal carcinoma with calcifications; ER+ (98%), PR+ (98%), Ki-67 6%, HER-2 negative (ratio 1.35)   04/04/2015 Breast MRI   Right breast: 2.0 cm hematoma/ seroma within the upper central portion of the breast following recent stereotactic guided core biopsy   04/05/2015 Clinical Stage   Stage IA: T1c N0   04/06/2015 Procedure   Genetics: OvaNext panel revealed  VUTS at [employer] (c.2164G>A) otherwise negative at ATM, BARD1, BRCA1, BRCA2, BRIP1, CDH1, CHEK2, EPCAM, MLH1, MRE11A, MSH6, MUTYH, NBN, NF1, PALB2, PMS2, PTEN, RAD50, RAD51C, RAD51D, SMARCA4, STK11, and TP53.   UPDATE:  MSH2 c.2164G>A VUS was amended to Variant, Likely Benign on 04/28/2019 due to internal data.   04/28/2015 Definitive Surgery   Right lumpectomy/SLNB [name]): Invasive ductal carcinoma grade 1, 0.9 cm, low-grade DCIS, margins negative, 0/6 sentinel nodes negative, ER 98%, PR 98%, HER-2 negative (ratio 1.29), Ki-67 6%,  Oncotype DX score 10 (7% ROR)   04/28/2015 Pathologic Stage   Stage IA (T1b N0)      04/28/2015 Oncotype testing   Score 10 (7% ROR)   06/15/2015 - 07/14/2015 Radiation Therapy   Adjuvant RT [name]): Right breast 42.72 Gy over 21 fractions; right breast boost 10 G over 5 fractions   07/14/2015 -  Anti-estrogen oral therapy   Tamoxifen 20 mg daily. Planned duration of therapy 5-10 years.   09/01/2015 Survivorship   Survivorship care plan completed and given to patient.     CHIEF COMPLIANT: Follow-up of right breast cancer on tamoxifen, recent bruising and low grade fevers  INTERVAL HISTORY: [name] is a 49 y.o. with above-mentioned history of right breast cancer treated with lumpectomy, radiation, and who is currently on tamoxifen. She presents to the clinic today for follow-up of recent increase in bruising and low grade fevers.  She gets these fevers twice a month at a low grade.  Is not associate with any other symptoms. She also bruises extremely easily.  Every bump on the skin leads to a big bruise.  She has not been taking any aspirin type medications.  ALLERGIES:  is allergic to ciprofloxacin and sulfa antibiotics.  MEDICATIONS:  Current Outpatient Medications  Medication Sig Dispense Refill  . ALPRAZolam (XANAX) 0.5 MG tablet TK 1 T PO Q 6 H PRA  1  . levothyroxine (SYNTHROID, LEVOTHROID) 50 MCG tablet Take 50 mcg by mouth daily before breakfast.    . lisinopril-hydrochlorothiazide (PRINZIDE,ZESTORETIC) 10-12.5 MG tablet Take 1 tablet by mouth daily.    . tamoxifen (NOLVADEX) 20 MG tablet Once daily 90 tablet 1  . venlafaxine XR (EFFEXOR-XR) 75 MG 24 hr capsule TAKE 1 CAPSULE(75 MG)  BY MOUTH DAILY WITH BREAKFAST 90 capsule 3   No current facility-administered medications for this visit.    PHYSICAL EXAMINATION: ECOG PERFORMANCE STATUS: 1 - Symptomatic but completely ambulatory  Vitals:   12/30/19 1424  BP: (!) 128/96  Pulse: 95  Resp: 20  Temp: 98.9 F (37.2 C)  SpO2: 98%   Filed Weights   12/30/19 1424  Weight: 178 lb 3.2 oz (80.8 kg)     LABORATORY DATA:  I have reviewed the data as listed CMP Latest Ref Rng & Units 06/19/2016 07/14/2015 04/26/2015  Glucose 65 - 99 mg/dL 157(H) 131 146(H)  BUN 6 - 20 mg/dL 11 12.9 8  Creatinine 0.44 - 1.00 mg/dL 0.84 0.8 0.67  Sodium 135 - 145 mmol/L 135 140 138  Potassium 3.5 - 5.1 mmol/L 3.3(L) 3.1(L) 3.5  Chloride 101 - 111 mmol/L 103 - 101  CO2 22 - 32 mmol/L _0 Calcium 8.9 - 10.3 mg/dL 10.0 9.8 9.6  Total Protein 6.4 - 8.3 g/dL - 7.6 7.8  Total Bilirubin 0.20 - 1.20 mg/dL - 0.76 0.9  Alkaline Phos 40 - 150 U/L - 60 67  AST 5 - 34 U/L - 14 24  ALT 0 - 55 U/L - 20 29    Lab Results  Component Value Date   WBC 7.1 12/30/2019   HGB 15.1 (H) 12/30/2019   HCT 44.4 12/30/2019   MCV 93.1 12/30/2019   PLT 211 12/30/2019   NEUTROABS 4.7 12/30/2019    ASSESSMENT & PLAN:  Breast cancer of upper-outer quadrant of right female breast Right lumpectomy 04/28/2015: Invasive ductal carcinoma grade 1, 0.9 cm, low-grade DCIS, margins negative, 0/6 sentinel nodes negative, ER 98%, PR 98%, HER-2 negative, Ki-67 6%, T1 BN 0 stage IA, Oncotype DX score 10, 7% risk of recurrence, Adj radiation therapy 06/15/2015 to 07/14/15  Current Treatment: Tamoxifen 20 mg daily started 07/30/15  Tamoxifen toxicities: 1. Occasional hot flashes 2. 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   Surveillance: 1. Breast exam 11/9/2020benign 2. mammogram 04/17/2019: Benign breast density category C  Right upper quadrant abdominal pain: Ultrasound of the abdomen will be ordered.  I will call her with this result. Easy bruising and intermittent fevers: - PT PTT INR -vWF and factor 8 -PFA  Today's platelet count was 210.  I suspect that the easy bruising is related to platelet dysfunction. I would like to discontinue tamoxifen briefly. We will send for breast cancer index to determine if she would benefit  from extended adjuvant therapy.  If she does not need extended adjuvant therapy then she can stop it.  Fevers intermittently twice a month: I suspect this is more of an autonomic dysfunction and a tamoxifen adverse effect.  We will see if it persists after stopping tamoxifen.  I will call her with the results of this test tomorrow. We will also have to call her regarding the breast cancer index.  No orders of the defined types were placed in this encounter.  The patient has a good understanding of the overall plan. she agrees with it. she will call with any problems that may develop before the next visit here.  Total time spent: 30 mins including face to face time and time spent for planning, charting and coordination of care  Nicholas Lose, MD 12/30/2019  I, Cloyde Reams Dorshimer, am acting as scribe for Dr. Nicholas Lose.  I have reviewed the above documentation for accuracy  and completeness, and I agree with the above.       

## 2019-12-30 NOTE — Telephone Encounter (Signed)
Ordered BCI per Dr. Lindi Adie. Faxed requisition to pathology and [facility].

## 2020-01-01 ENCOUNTER — Other Ambulatory Visit (HOSPITAL_COMMUNITY)
Admission: RE | Admit: 2020-01-01 | Discharge: 2020-01-01 | Disposition: A | Discharge status: Home / Self Care | Payer: 59 | Source: Ambulatory Visit | Attending: Hematology and Oncology | Admitting: Hematology and Oncology

## 2020-01-01 DIAGNOSIS — Z853 Personal history of malignant neoplasm of breast: Secondary | ICD-10-CM | POA: Diagnosis present

## 2020-01-01 DIAGNOSIS — Z170 Estrogen receptor positive status [ER+]: Secondary | ICD-10-CM | POA: Insufficient documentation

## 2020-01-01 DIAGNOSIS — C50411 Malignant neoplasm of upper-outer quadrant of right female breast: Secondary | ICD-10-CM | POA: Insufficient documentation

## 2020-01-01 LAB — COAG STUDIES INTERP REPORT

## 2020-01-01 LAB — VON WILLEBRAND PANEL
Coagulation Factor VIII: 37 % — ABNORMAL LOW (ref 56–140)
Ristocetin Co-factor, Plasma: 45 % — ABNORMAL LOW (ref 50–200)
Von Willebrand Antigen, Plasma: 77 % (ref 50–200)

## 2020-01-04 ENCOUNTER — Telehealth: Payer: Self-pay | Admitting: Hematology and Oncology

## 2020-01-04 NOTE — Telephone Encounter (Signed)
I informed the patient that the results of von Willebrand factor and factor VIII were surprising.  She does appear to have some findings suggestive of von Willebrand factor deficiency (type II N: Which is dysfunctional von Willebrand factor).  However the final interpretation was that she does not meet the criteria to be called as von Willebrand factor deficiency. The factor VIII deficiency it was also surprising.  I suspect that it is a result of von Willebrand factor dysfunction.  I do not believe she has hemophilia. With regards to the platelet function assay she was found to have findings suggestive of aspirin effect.  Patient tells me that she was taking Advil periodically.  I instructed her to stop Advil.  We will watch her for now.  In 2 months I asked her to call us back regarding the bruising.  If it persist then we will repeat the von Willebrand factor analysis and factor VIII.

## 2020-01-18 ENCOUNTER — Encounter: Payer: Self-pay | Admitting: Hematology and Oncology

## 2020-01-20 LAB — VON WILLEBRAND FACTOR MULTIMER

## 2020-03-03 ENCOUNTER — Other Ambulatory Visit: Payer: Self-pay | Admitting: Hematology and Oncology

## 2020-03-03 DIAGNOSIS — Z9889 Other specified postprocedural states: Secondary | ICD-10-CM

## 2020-03-16 ENCOUNTER — Other Ambulatory Visit: Payer: Self-pay | Admitting: Family Medicine

## 2020-03-16 ENCOUNTER — Other Ambulatory Visit: Payer: Self-pay

## 2020-03-16 ENCOUNTER — Ambulatory Visit
Admission: RE | Admit: 2020-03-16 | Discharge: 2020-03-16 | Disposition: A | Discharge status: Home / Self Care | Payer: 59 | Source: Ambulatory Visit | Attending: Family Medicine | Admitting: Family Medicine

## 2020-03-16 DIAGNOSIS — R053 Chronic cough: Secondary | ICD-10-CM

## 2020-03-21 ENCOUNTER — Telehealth: Payer: Self-pay | Admitting: *Deleted

## 2020-03-21 NOTE — Telephone Encounter (Signed)
Received call from pt with complaint of pain in the left breast.  Pt reports constant pain/ soreness especially around the nipple unrelieved by NSAIDS and warm compresses.  Pt reports left breast is also swollen compared to the right.  Denies redness or recent trauma to the area.  Per MD pt to be examined by NP for evaluation and treatment.  Apt scheduled and pt verbalized understanding of date and time.

## 2020-03-23 ENCOUNTER — Inpatient Hospital Stay: Payer: 59 | Attending: Hematology and Oncology | Admitting: Adult Health

## 2020-03-23 ENCOUNTER — Other Ambulatory Visit: Payer: Self-pay

## 2020-03-23 ENCOUNTER — Encounter: Payer: Self-pay | Admitting: Adult Health

## 2020-03-23 VITALS — BP 137/92 | HR 93 | Temp 98.0°F | Resp 18 | Ht 66.0 in | Wt 177.7 lb

## 2020-03-23 DIAGNOSIS — Z923 Personal history of irradiation: Secondary | ICD-10-CM | POA: Diagnosis not present

## 2020-03-23 DIAGNOSIS — Z803 Family history of malignant neoplasm of breast: Secondary | ICD-10-CM | POA: Diagnosis not present

## 2020-03-23 DIAGNOSIS — Z90710 Acquired absence of both cervix and uterus: Secondary | ICD-10-CM | POA: Diagnosis not present

## 2020-03-23 DIAGNOSIS — Z801 Family history of malignant neoplasm of trachea, bronchus and lung: Secondary | ICD-10-CM | POA: Diagnosis not present

## 2020-03-23 DIAGNOSIS — Z90722 Acquired absence of ovaries, bilateral: Secondary | ICD-10-CM | POA: Insufficient documentation

## 2020-03-23 DIAGNOSIS — Z79810 Long term (current) use of selective estrogen receptor modulators (SERMs): Secondary | ICD-10-CM | POA: Insufficient documentation

## 2020-03-23 DIAGNOSIS — Z8042 Family history of malignant neoplasm of prostate: Secondary | ICD-10-CM | POA: Insufficient documentation

## 2020-03-23 DIAGNOSIS — C50411 Malignant neoplasm of upper-outer quadrant of right female breast: Secondary | ICD-10-CM | POA: Diagnosis not present

## 2020-03-23 DIAGNOSIS — I10 Essential (primary) hypertension: Secondary | ICD-10-CM | POA: Insufficient documentation

## 2020-03-23 DIAGNOSIS — E039 Hypothyroidism, unspecified: Secondary | ICD-10-CM | POA: Insufficient documentation

## 2020-03-23 DIAGNOSIS — Z800 Family history of malignant neoplasm of digestive organs: Secondary | ICD-10-CM | POA: Diagnosis not present

## 2020-03-23 DIAGNOSIS — Z9079 Acquired absence of other genital organ(s): Secondary | ICD-10-CM | POA: Diagnosis not present

## 2020-03-23 DIAGNOSIS — N644 Mastodynia: Secondary | ICD-10-CM

## 2020-03-23 DIAGNOSIS — Z170 Estrogen receptor positive status [ER+]: Secondary | ICD-10-CM | POA: Diagnosis not present

## 2020-03-23 NOTE — Progress Notes (Signed)
[facility] [facility] Follow up:    [name], [name] [id number]   DIAGNOSIS: Cancer Staging Breast cancer of upper-outer quadrant of right female breast [name]) Staging form: Breast, AJCC 7th Edition - Clinical stage from 03/23/2015: Stage IA (T1c, N0, M0) - Unsigned - Pathologic stage from 04/28/2015: Stage IA (T1b, N0, cM0) - Unsigned   SUMMARY OF ONCOLOGIC HISTORY: Oncology History  Breast cancer of upper-outer quadrant of right female breast ([name])  03/16/2015 Mammogram   Right breast: mass   03/16/2015 Breast US   Right breast: hypoechoic irregular shadowing mass at 12 o'clock, 4 cm from the nipple measuring 1.4 x 1.3 x 1.2 cm. Right axillary lymph nodes with smooth cortical thickness   03/22/2015 Initial Biopsy   Right breast biopsy: Invasive ductal carcinoma with calcifications; ER+ (98%), PR+ (98%), Ki-67 6%, HER-2 negative (ratio 1.35)   04/04/2015 Breast MRI   Right breast: 2.0 cm hematoma/ seroma within the upper central portion of the breast following recent stereotactic guided core biopsy   04/05/2015 Clinical Stage   Stage IA: T1c N0   04/06/2015 Procedure   Genetics: OvaNext panel revealed  VUTS at [place] (c.2164G>A) otherwise negative at ATM, BARD1, BRCA1, BRCA2, BRIP1, CDH1, CHEK2, EPCAM, MLH1, MRE11A, MSH6, MUTYH, NBN, NF1, PALB2, PMS2, PTEN, RAD50, RAD51C, RAD51D, SMARCA4, STK11, and TP53.   UPDATE:  MSH2 c.2164G>A VUS was amended to Variant, Likely Benign on 04/28/2019 due to internal data.   04/28/2015 Definitive Surgery   Right lumpectomy/[facility] [name]): Invasive ductal carcinoma grade 1, 0.9 cm, low-grade DCIS, margins negative, 0/6 sentinel nodes negative, ER 98%, PR 98%, HER-2 negative (ratio 1.29), Ki-67 6%,  Oncotype DX score 10 (7% ROR)   04/28/2015 Pathologic Stage   Stage IA (T1b N0)    04/28/2015 Oncotype testing   Score 10 (7% ROR)   06/15/2015 - 07/14/2015 Radiation Therapy   Adjuvant RT [name]):  Right breast 42.72 Gy over 21 fractions; right breast boost 10 G over 5 fractions   07/14/2015 -  Anti-estrogen oral therapy   Tamoxifen 20 mg daily. Planned duration of therapy 5-10 years.   09/01/2015 Survivorship   Survivorship care plan completed and given to patient.     CURRENT THERAPY: observation  INTERVAL HISTORY: [name] 49 y.o. female returns for evaluation for left breast swelling and pain.  This has been going on for the past 2.5 weeks.  She stopped taking Tamoxifen about 2 months ago.  Initially she thought the breast soreness was related to not taking the tamoxifen, however this soreness persisted and worsened.  She then developed breast swelling.  She denies any erythema, or change in anything that could have precipitated this pain.     Patient Active Problem List   Diagnosis Date Noted  . Right upper quadrant abdominal pain 12/26/2015  . Genetic testing 04/27/2015  . Family history of breast cancer   . Breast cancer of upper-outer quadrant of right female breast ([name]) 03/29/2015  . S/P laparoscopic assisted vaginal hysterectomy (LAVH) 07/27/2014    is allergic to ciprofloxacin and sulfa antibiotics.  MEDICAL HISTORY: Past Medical History:  Diagnosis Date  . Breast cancer ([name])   . Family history of breast cancer   . Hypertension   . Hypothyroidism   . 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 Menorrhagia   . PCOS (polycystic ovarian syndrome)   . Radiation 06/15/15-07/14/15   Right Breast ductal ca  SURGICAL HISTORY: Past Surgical History:  Procedure Laterality Date  . ACHILLES TENDON REPAIR Right 2002  . APPENDECTOMY  02-21-2009  . BILATERAL SALPINGECTOMY Bilateral 07/27/2014   Procedure: BILATERAL SALPINGECTOMY;  Surgeon: [name], MD;  Location: [facility];  Service: Gynecology;  Laterality: Bilateral;  . BREAST LUMPECTOMY Right 04/28/2015  . Breast, Right Needle Core Biospy  Right 03/22/15  . CESAREAN SECTION  10-09-2000  &  1999  . EXCISION HAGLUND'S DEFORMITY WITH ACHILLES TENDON REPAIR Right 06/21/2016   Procedure: RIGHT ACHILLES TENDON DEBRIDEMENT AND RECONSTRUCTION; EXCISION HAGLUND DEFORMITY;  Surgeon: Wylene Simmer, MD;  Location: [name];  Service: Orthopedics;  Laterality: Right;  . GASTROC RECESSION EXTREMITY Right 06/21/2016   Procedure: GASTROC RECESSION;  Surgeon: Wylene Simmer, MD;  Location: [name];  Service: Orthopedics;  Laterality: Right;  . HYSTEROSCOPY WITH NOVASURE  11/ 2014  . LAPAROSCOPIC ASSISTED VAGINAL HYSTERECTOMY N/A 07/27/2014   Procedure: LAPAROSCOPIC ASSISTED VAGINAL HYSTERECTOMY;  Surgeon: [name], MD;  Location: [name];  Service: Gynecology;  Laterality: N/A;  . RADIOACTIVE SEED GUIDED PARTIAL MASTECTOMY WITH AXILLARY SENTINEL LYMPH NODE BIOPSY Right 04/28/2015   Procedure: RADIOACTIVE SEED GUIDED RIGHT BREAST PARTIAL MASTECTOMY WITH RIGHT AXILLARY SENTINEL LYMPH NODE BIOPSY;  Surgeon: [name], MD;  Location: [name];  Service: General;  Laterality: Right;  . TONSILLECTOMY  age 24    SOCIAL HISTORY: Social History   Socioeconomic History  . Marital status: Married    Spouse name: Not on file  . Number of children: 2  . Years of education: Not on file  . Highest education level: Not on file  Occupational History  . Not on file  Tobacco Use  . Smoking status: Never Smoker  . Smokeless tobacco: Never Used  Substance and Sexual Activity  . Alcohol use: Yes    Alcohol/week: 3.0 standard drinks    Types: 3 Glasses of wine per week    Comment: 2 glasses of wine weekly  . Drug use: No  . Sexual activity: Yes    Birth control/protection: Surgical  Other Topics Concern  . Not on file  Social History Narrative  . Not on file   Social Determinants of Health   Financial Resource Strain:   . Difficulty of Paying Living Expenses:   Food  Insecurity:   . Worried About Charity fundraiser in the Last Year:   . Arboriculturist in the Last Year:   Transportation Needs:   . Film/video editor (Medical):   Marland Kitchen Lack of Transportation (Non-Medical):   Physical Activity:   . Days of Exercise per Week:   . Minutes of Exercise per Session:   Stress:   . Feeling of Stress :   Social Connections:   . Frequency of Communication with Friends and Family:   . Frequency of Social Gatherings with Friends and Family:   . Attends Religious Services:   . Active Member of Clubs or Organizations:   . Attends Archivist Meetings:   Marland Kitchen Marital Status:   Intimate Partner Violence:   . Fear of Current or Ex-Partner:   . Emotionally Abused:   Marland Kitchen Physically Abused:   . Sexually Abused:     FAMILY HISTORY: Family History  Problem Relation Age of Onset  . Breast cancer Maternal Aunt 61  . Lung cancer Maternal Uncle        smoker  . Prostate cancer Paternal Uncle   .  Colon cancer Maternal Grandfather   . ALS Paternal Grandfather   . Breast cancer Other        MGM's 2 sisters possibly had breast cancer    Review of Systems  Constitutional: Negative for appetite change, chills, fatigue and fever.  HENT:   Negative for hearing loss and lump/mass.   Eyes: Negative for eye problems and icterus.  Respiratory: Negative for chest tightness, cough, shortness of breath and wheezing.   Cardiovascular: Negative for chest pain, leg swelling and palpitations.  Gastrointestinal: Negative for abdominal distention, abdominal pain, blood in stool, constipation, diarrhea, nausea and vomiting.  Endocrine: Negative for hot flashes.  Genitourinary: Negative for difficulty urinating.   Skin: Negative for itching and rash.  Neurological: Negative for dizziness, extremity weakness, headaches and numbness.  Hematological: Negative for adenopathy. Does not bruise/bleed easily.  Psychiatric/Behavioral: Negative for depression. The patient is not  nervous/anxious.       PHYSICAL EXAMINATION  ECOG PERFORMANCE STATUS: 1 - Symptomatic but completely ambulatory  Vitals:   03/23/20 1130  BP: (!) 137/92  Pulse: 93  Resp: 18  Temp: 98 F (36.7 C)  SpO2: 99%    Physical Exam Constitutional:      General: She is not in acute distress.    Appearance: Normal appearance. She is not toxic-appearing.  HENT:     Head: Normocephalic and atraumatic.  Cardiovascular:     Rate and Rhythm: Normal rate and regular rhythm.     Pulses: Normal pulses.     Heart sounds: Normal heart sounds.     Comments: Right breast s/p lumpectomy and radiation, no sign of local recurrence, left breast with + swelling, +focal tenderness, particularly around areola.  No warmth or erythema noted. Pulmonary:     Effort: Pulmonary effort is normal.     Breath sounds: Normal breath sounds.  Abdominal:     General: Abdomen is flat. Bowel sounds are normal. There is no distension.     Palpations: Abdomen is soft.     Tenderness: There is no abdominal tenderness.  Skin:    General: Skin is warm and dry.     Capillary Refill: Capillary refill takes less than 2 seconds.     Findings: No rash.  Neurological:     General: No focal deficit present.     Mental Status: She is alert.  Psychiatric:        Mood and Affect: Mood normal.        Behavior: Behavior normal.     LABORATORY DATA:  CBC    Component Value Date/Time   WBC 7.1 12/30/2019 1356   WBC 5.4 10/31/2017 0838   WBC 8.0 04/26/2015 1405   RBC 4.77 12/30/2019 1356   HGB 15.1 (H) 12/30/2019 1356   HGB 13.6 10/31/2017 0838   HCT 44.4 12/30/2019 1356   HCT 40.1 10/31/2017 0838   PLT 211 12/30/2019 1356   PLT 200 10/31/2017 0838   MCV 93.1 12/30/2019 1356   MCV 92.2 10/31/2017 0838   MCH 31.7 12/30/2019 1356   MCHC 34.0 12/30/2019 1356   RDW 11.9 12/30/2019 1356   RDW 12.6 10/31/2017 0838   LYMPHSABS 1.8 12/30/2019 1356   LYMPHSABS 1.8 10/31/2017 0838   MONOABS 0.5 12/30/2019 1356    MONOABS 0.3 10/31/2017 0838   EOSABS 0.1 12/30/2019 1356   EOSABS 0.1 10/31/2017 0838   BASOSABS 0.1 12/30/2019 1356   BASOSABS 0.0 10/31/2017 0838    CMP     Component Value Date/Time  NA 140 12/30/2019 1356   NA 140 07/14/2015 0818   K 4.5 12/30/2019 1356   K 3.1 (L) 07/14/2015 0818   CL 104 12/30/2019 1356   CO2 26 12/30/2019 1356   CO2 24 07/14/2015 0818   GLUCOSE 143 (H) 12/30/2019 1356   GLUCOSE 131 07/14/2015 0818   BUN 12 12/30/2019 1356   BUN 12.9 07/14/2015 0818   CREATININE 0.83 12/30/2019 1356   CREATININE 0.8 07/14/2015 0818   CALCIUM 9.4 12/30/2019 1356   CALCIUM 9.8 07/14/2015 0818   PROT 7.8 12/30/2019 1356   PROT 7.6 07/14/2015 0818   ALBUMIN 4.7 12/30/2019 1356   ALBUMIN 4.5 07/14/2015 0818   AST 19 12/30/2019 1356   AST 14 07/14/2015 0818   ALT 31 12/30/2019 1356   ALT 20 07/14/2015 0818   ALKPHOS 76 12/30/2019 1356   ALKPHOS 60 07/14/2015 0818   BILITOT 0.4 12/30/2019 1356   BILITOT 0.76 07/14/2015 0818   GFRNONAA >60 12/30/2019 1356   GFRAA >60 12/30/2019 1356           ASSESSMENT and THERAPY PLAN:   Breast cancer of upper-outer quadrant of right female breast Right lumpectomy 04/28/2015: Invasive ductal carcinoma grade 1, 0.9 cm, low-grade DCIS, margins negative, 0/6 sentinel nodes negative, ER 98%, PR 98%, HER-2 negative, Ki-67 6%, T1 BN 0 stage IA, Oncotype DX score 10, 7% risk of recurrence, Adj radiation therapy 06/15/2015 to 07/14/15  Current Treatment: Tamoxifen 20 mg daily started 07/30/15  Tamoxifen toxicities: 1. Occasional hot flashes 2. 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   Surveillance: 1. Breast exam 11/9/2020benign 2. mammogram 04/17/2019: Benign breast density category C  Due to her left breast swelling and pain, I placed orders for left breast diagnostic mammogram and ultrasound to be completed as soon as possible  at the breast center.  In the meantime she will take tylenol for the pain.  If that imaging is inconclusive, we will then discuss the need of breast MRI, as clinically the left breast is different than the right and she has increased breast density.    Orders Placed This Encounter  Procedures  . MM DIAG BREAST TOMO UNI LEFT    Ins: uhc Pf: 04/17/19 @ bcg;  Left breast pain; no implants; hx br ca; 02/18/20 completed covid vax-mg/pt  Sent for cosign    Standing Status:   Future    Standing Expiration Date:   03/23/2021    Order Specific Question:   Reason for Exam (SYMPTOM  OR DIAGNOSIS REQUIRED)    Answer:   pain and swelling at left areola, ? tissue thickness change under areola, also focal tenderness    Order Specific Question:   Preferred imaging location?    Answer:   [facility]    Order Specific Question:   Is the patient pregnant?    Answer:   No  . US BREAST LTD UNI LEFT INC AXILLA    Ins: uhc Pf: 04/17/19 @ bcg;  Left breast pain; no implants; hx br ca; 02/18/20 completed covid vax-mg/pt  Sent for cosign    Standing Status:   Future    Standing Expiration Date:   03/23/2021    Order Specific Question:   Reason for Exam (SYMPTOM  OR DIAGNOSIS REQUIRED)    Answer:   pain and swelling at left areola, ? tissue thickness change under areola, also focal tenderness    Order Specific Question:   Preferred  imaging location?    Answer:   [facility]    All questions were answered. The patient knows to call the clinic with any problems, questions or concerns. We can certainly see the patient much sooner if necessary.  Total encounter time: 20 minutes*  Wilber Bihari, NP 03/23/20 4:40 PM Medical Oncology and Hematology [facility] [place], [place] [address] Tel. [phone]    Fax. [phone]  *Total Encounter Time as defined by the Centers for Medicare and Medicaid Services includes, in addition to the face-to-face time of a patient visit  (documented in the note above) non-face-to-face time: obtaining and reviewing outside history, ordering and reviewing medications, tests or procedures, care coordination (communications with other health care professionals or caregivers) and documentation in the medical record.

## 2020-03-23 NOTE — Assessment & Plan Note (Addendum)
Right lumpectomy 04/28/2015: Invasive ductal carcinoma grade 1, 0.9 cm, low-grade DCIS, margins negative, 0/6 sentinel nodes negative, ER 98%, PR 98%, HER-2 negative, Ki-67 6%, T1 BN 0 stage IA, Oncotype DX score 10, 7% risk of recurrence, Adj radiation therapy 06/15/2015 to 07/14/15  Current Treatment: Tamoxifen 20 mg daily started 07/30/15  Tamoxifen toxicities: 1. Occasional hot flashes 2. 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   Surveillance: 1. Breast exam 11/9/2020benign 2. mammogram 04/17/2019: Benign breast density category C  Due to her left breast swelling and pain, I placed orders for left breast diagnostic mammogram and ultrasound to be completed as soon as possible at the breast center.  In the meantime she will take tylenol for the pain.  If that imaging is inconclusive, we will then discuss the need of breast MRI, as clinically the left breast is different than the right and she has increased breast density.

## 2020-03-24 ENCOUNTER — Telehealth: Payer: Self-pay | Admitting: Adult Health

## 2020-03-24 NOTE — Telephone Encounter (Signed)
No 5/26 los. No changes made to pt's schedule.  

## 2020-03-29 ENCOUNTER — Ambulatory Visit
Admission: RE | Admit: 2020-03-29 | Discharge: 2020-03-29 | Disposition: A | Discharge status: Home / Self Care | Payer: 59 | Source: Ambulatory Visit | Attending: Adult Health | Admitting: Adult Health

## 2020-03-29 ENCOUNTER — Other Ambulatory Visit: Payer: Self-pay

## 2020-03-29 DIAGNOSIS — C50411 Malignant neoplasm of upper-outer quadrant of right female breast: Secondary | ICD-10-CM

## 2020-03-29 DIAGNOSIS — Z170 Estrogen receptor positive status [ER+]: Secondary | ICD-10-CM

## 2020-03-29 DIAGNOSIS — N644 Mastodynia: Secondary | ICD-10-CM

## 2020-03-30 ENCOUNTER — Other Ambulatory Visit: Payer: Self-pay | Admitting: Adult Health

## 2020-03-30 DIAGNOSIS — C50411 Malignant neoplasm of upper-outer quadrant of right female breast: Secondary | ICD-10-CM

## 2020-03-30 DIAGNOSIS — Z170 Estrogen receptor positive status [ER+]: Secondary | ICD-10-CM

## 2020-03-30 NOTE — Progress Notes (Signed)
Patient breast is still painful and swollen with no erythema or warmth after mammogram and ultrasound that was non diagnostic.  Will order breast MRI for further evaluation.    Wilber Bihari, NP

## 2020-03-31 ENCOUNTER — Telehealth: Payer: Self-pay

## 2020-03-31 NOTE — Telephone Encounter (Signed)
Per Wilber Bihari, NP, spoke with pt yesterday regarding breast pain/swelling and that her dx mammo was inconclusive. Pt states her breast pain persists; however, swelling has decreased and pt states she is interested in MRI for further investigation. Informed pt that I would contact her about MRI appt after receiving PA from insurance; verbalized thanks and understanding.

## 2020-03-31 NOTE — Telephone Encounter (Signed)
Called pt to inform her that breast MRI has been scheduled for 04/19/20 at 10 am and pt understands she should arrive at 0930. Pt requests one tie dose of Valium for MRI. Sent message to Lavone Neri, NP requesting this for pt. Pt verbalized thanks and understanding.

## 2020-04-04 ENCOUNTER — Other Ambulatory Visit: Payer: Self-pay | Admitting: Hematology and Oncology

## 2020-04-15 ENCOUNTER — Other Ambulatory Visit: Payer: Self-pay | Admitting: Adult Health

## 2020-04-15 ENCOUNTER — Telehealth: Payer: Self-pay | Admitting: *Deleted

## 2020-04-15 NOTE — Telephone Encounter (Signed)
Received call from pt with request for perscription for Valium to take prior to breast MRI next week.  Pt states she is extremely claustrophobic and valium has helped in the past.  Pt state she will have someone drive her to and from the apt.  Pt requesting it be sent to pharmacy on file. RN will review with Wilber Bihari, NP for recommendations.

## 2020-04-18 ENCOUNTER — Other Ambulatory Visit: Payer: Self-pay | Admitting: Adult Health

## 2020-04-18 ENCOUNTER — Other Ambulatory Visit: Payer: Self-pay

## 2020-04-18 ENCOUNTER — Telehealth: Payer: Self-pay

## 2020-04-18 ENCOUNTER — Ambulatory Visit
Admission: RE | Admit: 2020-04-18 | Discharge: 2020-04-18 | Disposition: A | Discharge status: Home / Self Care | Payer: 59 | Source: Ambulatory Visit | Attending: Hematology and Oncology | Admitting: Hematology and Oncology

## 2020-04-18 DIAGNOSIS — Z170 Estrogen receptor positive status [ER+]: Secondary | ICD-10-CM

## 2020-04-18 DIAGNOSIS — C50411 Malignant neoplasm of upper-outer quadrant of right female breast: Secondary | ICD-10-CM

## 2020-04-18 DIAGNOSIS — Z9889 Other specified postprocedural states: Secondary | ICD-10-CM

## 2020-04-18 MED ORDER — DIAZEPAM 5 MG PO TABS: 5.0000 mg | ORAL_TABLET | ORAL | 0 refills | Status: AC

## 2020-04-18 NOTE — Telephone Encounter (Signed)
Called regarding request for Valium Rx for MRI this week per Wilber Bihari, NP. She last took Valium for MRI 5 years ago, she took 2 tabs. Message given to Wilber Bihari, NP.

## 2020-04-19 ENCOUNTER — Ambulatory Visit (HOSPITAL_COMMUNITY)
Admission: RE | Admit: 2020-04-19 | Discharge: 2020-04-19 | Disposition: A | Discharge status: Home / Self Care | Payer: 59 | Source: Ambulatory Visit | Attending: Adult Health | Admitting: Adult Health

## 2020-04-19 ENCOUNTER — Other Ambulatory Visit: Payer: Self-pay

## 2020-04-19 DIAGNOSIS — C50411 Malignant neoplasm of upper-outer quadrant of right female breast: Secondary | ICD-10-CM

## 2020-04-19 DIAGNOSIS — Z170 Estrogen receptor positive status [ER+]: Secondary | ICD-10-CM | POA: Insufficient documentation

## 2020-04-19 MED ORDER — GADOBUTROL 1 MMOL/ML IV SOLN
8.0000 mL | Freq: Once | INTRAVENOUS | Status: AC | PRN
  Administered 2020-04-19: 8 mL via INTRAVENOUS

## 2020-04-21 ENCOUNTER — Telehealth: Payer: Self-pay

## 2020-04-21 NOTE — Telephone Encounter (Signed)
-----   Message from Gardenia Phlegm, NP sent at 04/21/2020  8:29 AM EDT ----- No cancer noted on MRI, please let me know if patient has any questions ----- Message ----- From: Interface, Rad Results In Sent: 04/19/2020   2:29 PM EDT To: Gardenia Phlegm, NP

## 2020-04-21 NOTE — Telephone Encounter (Signed)
I would recommend clean eating, limiting caffeine, drinking plenty of water.  If still an issue, should f/u with GYN

## 2020-04-21 NOTE — Telephone Encounter (Signed)
Advised pt of below recommendation. Pt states she has one cup of coffee per day and for the most part eats clean with very little processed food in her diet. Pt verbalizes understanding that she should call her GYN if this persists.

## 2020-04-21 NOTE — Telephone Encounter (Signed)
Called pt to review results per April Bihari, NP. Pt verbalizes thanks for calling with results; however is still experiencing pain and swelling to left breast and expresses concerns for what to do about this going forward. Please advise.

## 2020-09-06 ENCOUNTER — Inpatient Hospital Stay: Payer: 59 | Admitting: Hematology and Oncology

## 2020-09-06 DIAGNOSIS — R238 Other skin changes: Secondary | ICD-10-CM | POA: Insufficient documentation

## 2020-09-06 DIAGNOSIS — R233 Spontaneous ecchymoses: Secondary | ICD-10-CM | POA: Insufficient documentation

## 2020-09-06 NOTE — Assessment & Plan Note (Deleted)
Previous work-up was negative for von Willebrand factor deficiency. Platelet function assay revealed aspirin effect.  I instructed her to stop Advil.

## 2020-09-06 NOTE — Assessment & Plan Note (Deleted)
Right lumpectomy 04/28/2015: Invasive ductal carcinoma grade 1, 0.9 cm, low-grade DCIS, margins negative, 0/6 sentinel nodes negative, ER 98%, PR 98%, HER-2 negative, Ki-67 6%, T1 BN 0 stage IA, Oncotype DX score 10, 7% risk of recurrence, Adj radiation therapy 06/15/2015 to 07/14/15  Current Treatment: Tamoxifen 20 mg daily started 07/30/15  Tamoxifen toxicities: 1. Occasional hot flashes 2. Emotional outbursts:Markedly improved on Effexor [place] [employer] [place] because of [place].   Surveillance: 1. Breast exam11/9/2021benign 2. mammogram6/21/2021: Benign breast density category C 3.  Breast MRI 04/19/2020: No evidence of malignancy density category C  Breast cancer index 01/04/2020: Predicts no benefit from extended adjuvant therapy.  Risk of late distant recurrence 1.3%. We discontinued tamoxifen therapy.

## 2021-03-20 ENCOUNTER — Other Ambulatory Visit: Payer: Self-pay | Admitting: Hematology and Oncology

## 2021-03-20 DIAGNOSIS — Z1231 Encounter for screening mammogram for malignant neoplasm of breast: Secondary | ICD-10-CM

## 2021-03-31 ENCOUNTER — Other Ambulatory Visit: Payer: Self-pay | Admitting: Hematology and Oncology

## 2021-04-05 NOTE — Assessment & Plan Note (Signed)
Right lumpectomy 04/28/2015: Invasive ductal carcinoma grade 1, 0.9 cm, low-grade DCIS, margins negative, 0/6 sentinel nodes negative, ER 98%, PR 98%, HER-2 negative, Ki-67 6%, T1 BN 0 stage IA, Oncotype DX score 10, 7% risk of recurrence, Adj radiation therapy 06/15/2015 to 07/14/15  Current Treatment: Tamoxifen 20 mg daily started 07/30/15  Tamoxifen toxicities: 1. Occasional hot flashes 2. Emotional outbursts:Markedly improved on Effexor [place] [employer] [place] has been closed because of COVID-19.   Surveillance: 1. Breast exam11/9/2020benign 2. mammogram6/21/21: Benign breast density category C 3. Breast MRI 04/19/21 Benign Density Cat C  Right upper quadrant abdominal pain: Ultrasound of the abdomen will be ordered. I will call her with this result. Easy bruising and intermittent fevers: - PT PTT INR -vWF and factor 8 -PFA  Today's platelet count was 210.  I suspect that the easy bruising is related to platelet dysfunction. I would like to discontinue tamoxifen briefly. We will send for breast cancer index to determine if she would benefit from extended adjuvant therapy.  If she does not need extended adjuvant therapy then she can stop it.  Fevers intermittently twice a month: I suspect this is more of an autonomic dysfunction and a tamoxifen adverse effect.  We will see if it persists after stopping tamoxifen.  I will call her with the results of this test tomorrow. We will also have to call her regarding the breast cancer index.

## 2021-04-05 NOTE — Progress Notes (Signed)
Patient Care Team: Carol Ada, MD as PCP - General (Family Medicine) Nicholas Lose, MD as Consulting Physician (Hematology and Oncology) Thea Silversmith, MD as Consulting Physician (Radiation Oncology) [name], MD as Consulting Physician (General Surgery) Sylvan Cheese, NP as Nurse Practitioner (Hematology and Oncology)  DIAGNOSIS:    ICD-10-CM   1. Malignant neoplasm of upper-outer quadrant of right breast in female, estrogen receptor positive ([place])  C50.411    Z17.0       SUMMARY OF ONCOLOGIC HISTORY: Oncology History  Breast cancer of upper-outer quadrant of right female breast ([place])  03/16/2015 Mammogram   Right breast: mass    03/16/2015 Breast US   Right breast: hypoechoic irregular shadowing mass at 12 o'clock, 4 cm from the nipple measuring 1.4 x 1.3 x 1.2 cm. Right axillary lymph nodes with smooth cortical thickness    03/22/2015 Initial Biopsy   Right breast biopsy: Invasive ductal carcinoma with calcifications; ER+ (98%), PR+ (98%), Ki-67 6%, HER-2 negative (ratio 1.35)    04/04/2015 Breast MRI   Right breast: 2.0 cm hematoma/ seroma within the upper central portion of the breast following recent stereotactic guided core biopsy    04/05/2015 Clinical Stage   Stage IA: T1c N0    04/06/2015 Procedure   Genetics: OvaNext panel revealed  VUTS at [place] (c.2164G>A) otherwise negative at ATM, BARD1, BRCA1, BRCA2, BRIP1, CDH1, CHEK2, EPCAM, MLH1, MRE11A, MSH6, MUTYH, NBN, NF1, PALB2, PMS2, PTEN, RAD50, RAD51C, RAD51D, SMARCA4, STK11, and TP53.   UPDATE:  MSH2 c.2164G>A VUS was amended to Variant, Likely Benign on 04/28/2019 due to internal data.   04/28/2015 Definitive Surgery   Right lumpectomy/SLNB [name]): Invasive ductal carcinoma grade 1, 0.9 cm, low-grade DCIS, margins negative, 0/6 sentinel nodes negative, ER 98%, PR 98%, HER-2 negative (ratio 1.29), Ki-67 6%,  Oncotype DX score 10 (7% ROR)    04/28/2015 Pathologic Stage   Stage IA (T1b  N0)     04/28/2015 Oncotype testing   Score 10 (7% ROR)    06/15/2015 - 07/14/2015 Radiation Therapy   Adjuvant RT [name]): Right breast 42.72 Gy over 21 fractions; right breast boost 10 G over 5 fractions    07/14/2015 -  Anti-estrogen oral therapy   Tamoxifen 20 mg daily. Planned duration of therapy 5-10 years.    09/01/2015 Survivorship   Survivorship care plan completed and given to patient.      CHIEF COMPLIANT: Follow-up of right breast cancer   INTERVAL HISTORY: [name] is a 50 y.o. with above-mentioned history of right breast cancer treated with lumpectomy, radiation, and who completed 5 years of tamoxifen. Mammogram on 04/18/20 showed no evidence of malignancy bilaterally. Breast MRI on 04/19/20 showed no evidence of malignancy. She presents to the clinic today for follow-up.  She complains of intermittent sharp pains in the left breast.  She had reconstructive surgery in October last year.  She had fat grafting into the left breast.  It is still slightly sore from that surgery.  ALLERGIES:  is allergic to ciprofloxacin and sulfa antibiotics.  MEDICATIONS:  Current Outpatient Medications  Medication Sig Dispense Refill   ALPRAZolam (XANAX) 0.5 MG tablet TK 1 T PO Q 6 H PRA  1   diazepam (VALIUM) 5 MG tablet Take 1 tablet (5 mg total) by mouth as directed. Take one tablet one hour prior to MRI, then one tablet upon arrival if needed; must have driver 2 tablet 0   empagliflozin (JARDIANCE) 10 MG TABS tablet Take 10 mg by mouth daily.  levothyroxine (SYNTHROID, LEVOTHROID) 50 MCG tablet Take 50 mcg by mouth daily before breakfast.     lisinopril (ZESTRIL) 10 MG tablet Take 10 mg by mouth daily.     pantoprazole (PROTONIX) 40 MG tablet Take 40 mg by mouth daily.     venlafaxine XR (EFFEXOR-XR) 75 MG 24 hr capsule TAKE 1 CAPSULE(75 MG) BY MOUTH DAILY WITH BREAKFAST 90 capsule 3   No current facility-administered medications for this visit.    PHYSICAL  EXAMINATION: ECOG PERFORMANCE STATUS: 1 - Symptomatic but completely ambulatory  Vitals:   04/06/21 1521  BP: 120/82  Pulse: (!) 113  Resp: 18  Temp: 98.1 F (36.7 C)  SpO2: 98%   Filed Weights   04/06/21 1521  Weight: 174 lb 3.2 oz (79 kg)    BREAST: No palpable masses or nodules in either right or left breasts. No palpable axillary supraclavicular or infraclavicular adenopathy no breast tenderness or nipple discharge. (exam performed in the presence of a chaperone)  LABORATORY DATA:  I have reviewed the data as listed CMP Latest Ref Rng & Units 12/30/2019 06/19/2016 07/14/2015  Glucose 70 - 99 mg/dL 143(H) 157(H) 131  BUN 6 - 20 mg/dL 12 11 12.9  Creatinine 0.44 - 1.00 mg/dL 0.83 0.84 0.8  Sodium 135 - 145 mmol/L 140 135 140  Potassium 3.5 - 5.1 mmol/L 4.5 3.3(L) 3.1(L)  Chloride 98 - 111 mmol/L 104 103 -  CO2 22 - 32 mmol/L _0 Calcium 8.9 - 10.3 mg/dL 9.4 10.0 9.8  Total Protein 6.5 - 8.1 g/dL 7.8 - 7.6  Total Bilirubin 0.3 - 1.2 mg/dL 0.4 - 0.76  Alkaline Phos 38 - 126 U/L 76 - 60  AST 15 - 41 U/L 19 - 14  ALT 0 - 44 U/L 31 - 20    Lab Results  Component Value Date   WBC 7.1 12/30/2019   HGB 15.1 (H) 12/30/2019   HCT 44.4 12/30/2019   MCV 93.1 12/30/2019   PLT 211 12/30/2019   NEUTROABS 4.7 12/30/2019    ASSESSMENT & PLAN:  Breast cancer of upper-outer quadrant of right female breast Right lumpectomy 04/28/2015: Invasive ductal carcinoma grade 1, 0.9 cm, low-grade DCIS, margins negative, 0/6 sentinel nodes negative, ER 98%, PR 98%, HER-2 negative, Ki-67 6%, T1 BN 0 stage IA, Oncotype DX score 10, 7% risk of recurrence, Adj radiation therapy 06/15/2015 to 07/14/15   Current Treatment: Tamoxifen 20 mg daily started 07/30/15 completed 2021 1 breast cancer index showed that she does not need to extend antiestrogen therapy   Tamoxifen toxicities: 1. Occasional hot flashes 2. 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    Surveillance: 1. Breast exam 04/06/2021 benign 2. mammogram 04/18/20: Benign breast density category C 3. Breast MRI 04/19/21 Benign Density Cat C    Breast cancer index: Low risk and hence tamoxifen was discontinued Return to clinic on an as-needed basis.    No orders of the defined types were placed in this encounter.  The patient has a good understanding of the overall plan. she agrees with it. she will call with any problems that may develop before the next visit here.  Total time spent: 20 mins including face to face time and time spent for planning, charting and coordination of care  Rulon Eisenmenger, MD, MPH 04/06/2021  I, Cloyde Reams Dorshimer, am acting as scribe for Dr. Nicholas Lose.  I have reviewed the above documentation  for accuracy and completeness, and I agree with the above.

## 2021-04-06 ENCOUNTER — Inpatient Hospital Stay: Payer: 59 | Attending: Hematology and Oncology | Admitting: Hematology and Oncology

## 2021-04-06 ENCOUNTER — Other Ambulatory Visit: Payer: Self-pay

## 2021-04-06 DIAGNOSIS — Z79899 Other long term (current) drug therapy: Secondary | ICD-10-CM | POA: Diagnosis not present

## 2021-04-06 DIAGNOSIS — Z923 Personal history of irradiation: Secondary | ICD-10-CM | POA: Insufficient documentation

## 2021-04-06 DIAGNOSIS — Z79810 Long term (current) use of selective estrogen receptor modulators (SERMs): Secondary | ICD-10-CM | POA: Insufficient documentation

## 2021-04-06 DIAGNOSIS — Z170 Estrogen receptor positive status [ER+]: Secondary | ICD-10-CM

## 2021-04-06 DIAGNOSIS — C50411 Malignant neoplasm of upper-outer quadrant of right female breast: Secondary | ICD-10-CM | POA: Diagnosis present

## 2021-07-21 ENCOUNTER — Other Ambulatory Visit: Payer: Self-pay

## 2021-07-21 ENCOUNTER — Ambulatory Visit
Admission: RE | Admit: 2021-07-21 | Discharge: 2021-07-21 | Disposition: A | Discharge status: Home / Self Care | Payer: 59 | Source: Ambulatory Visit | Attending: Hematology and Oncology | Admitting: Hematology and Oncology

## 2021-07-21 DIAGNOSIS — Z1231 Encounter for screening mammogram for malignant neoplasm of breast: Secondary | ICD-10-CM

## 2021-10-23 IMAGING — US US BREAST*L* LIMITED INC AXILLA
1 series · 5 of 5 positions shown · non-contrast
Comparison: Previous exam(s).

CLINICAL DATA: 49-year-old female with history of treated right
breast cancer presents with swelling and pain involving the central
left breast. The patient stopped taking tamoxifen approximately 3
months ago.

EXAM:
DIGITAL DIAGNOSTIC LEFT MAMMOGRAM WITH CAD AND TOMO
ULTRASOUND LEFT BREAST

[Series 1: us breast*left* limited inc axilla · 0.07mm/px · 5 of 5 slices shown]
[im 1/5]
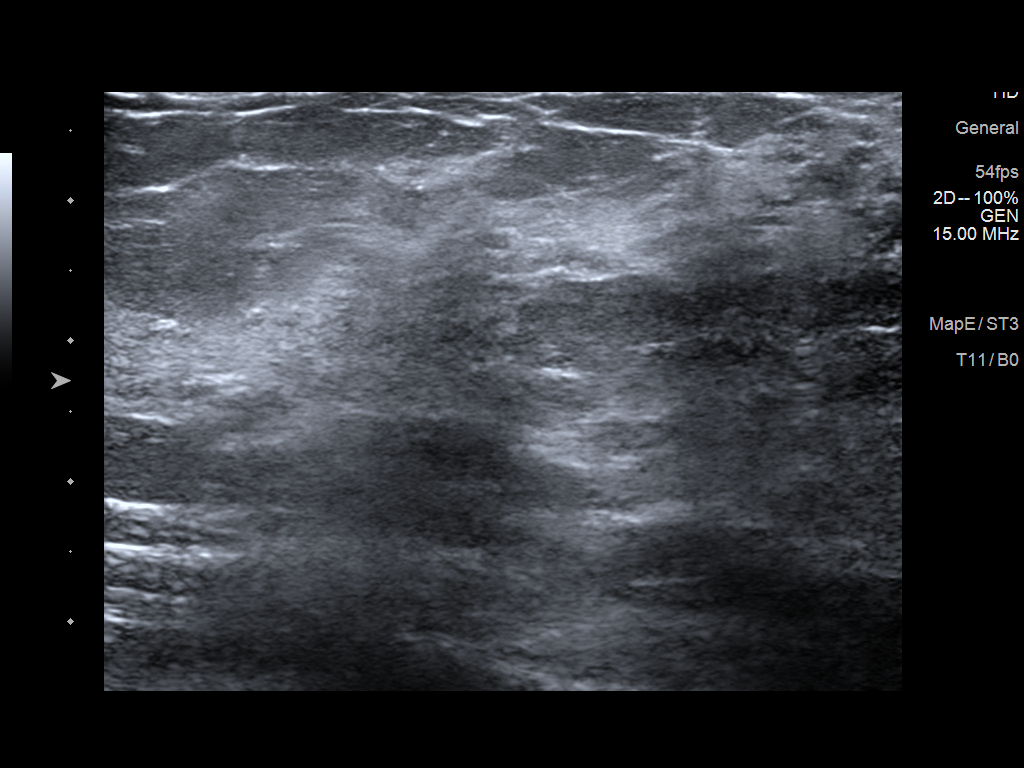
[im 2/5]
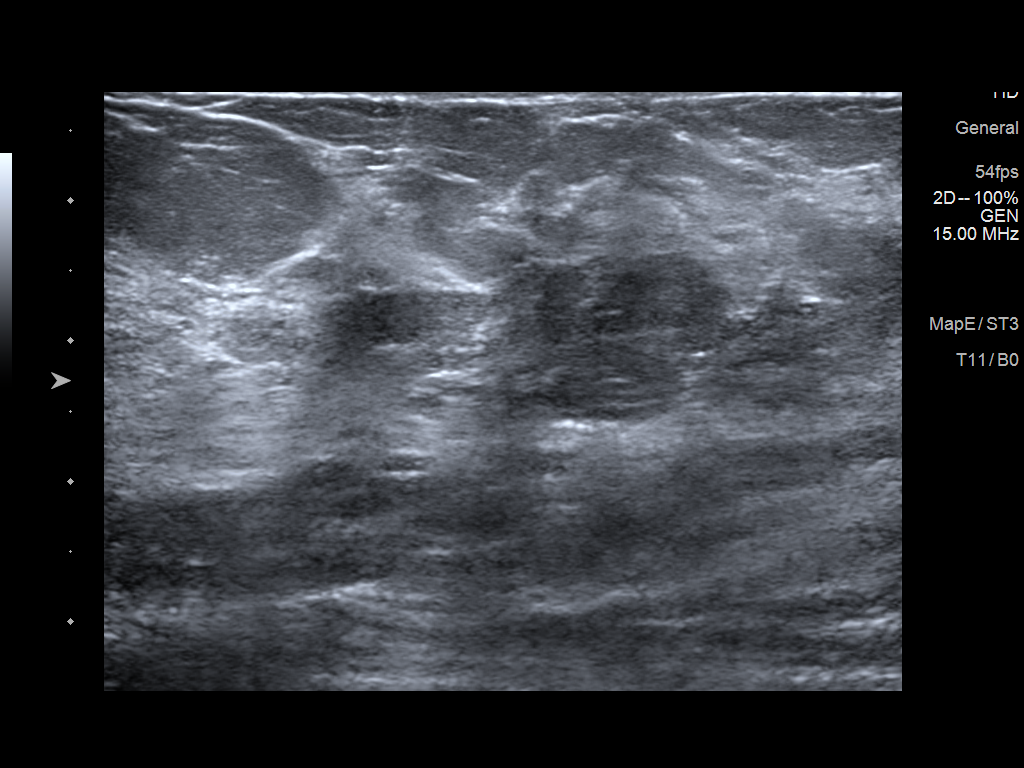
[im 3/5]
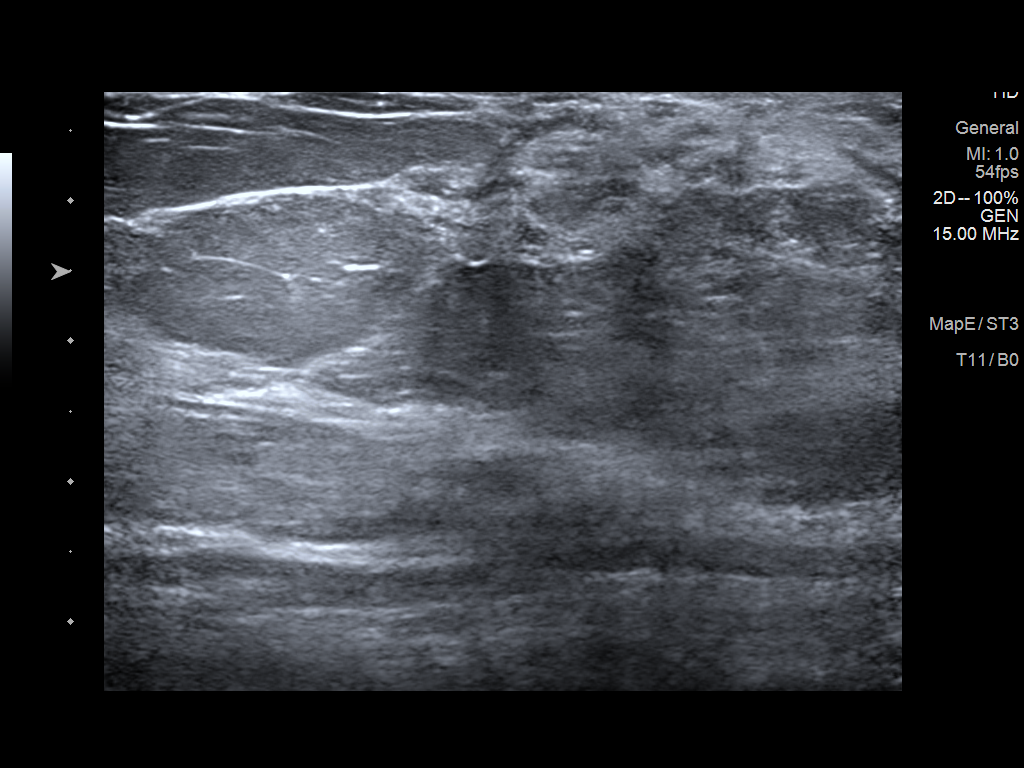
[im 4/5]
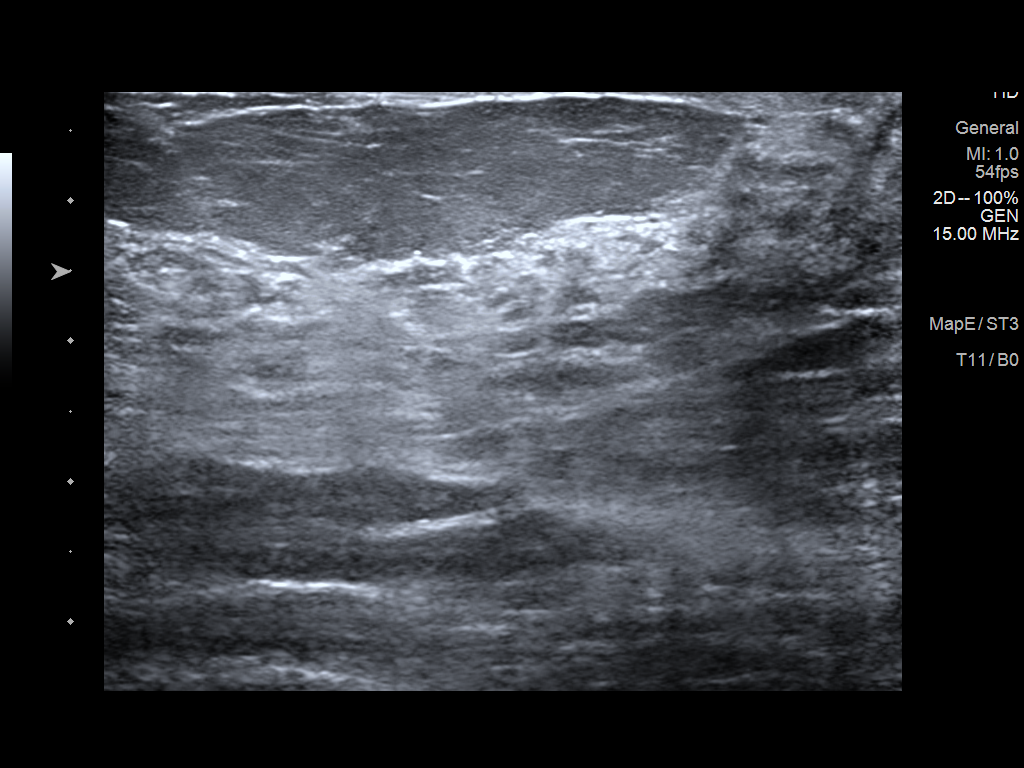
[im 5/5]
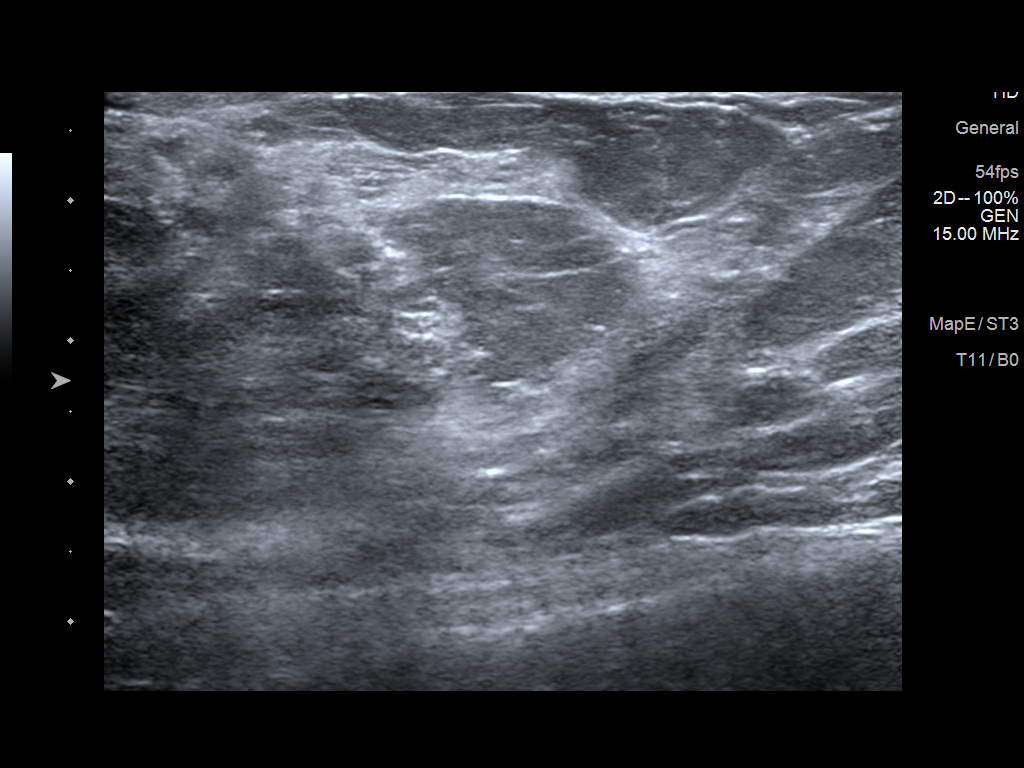

[5 of 5 positions shown; findings below may reference images not displayed]

ACR Breast Density Category c: The breast tissue is heterogeneously
dense, which may obscure small masses.
FINDINGS: No suspicious mammographic findings are identified in the left
breast. The parenchymal pattern is stable

Mammographic images were processed with CAD.

On physical exam, I palpate no suspicious lumps in the central left
breast.

Targeted ultrasound is performed, showing normal fibroglandular
tissue without focal or suspicious sonographic abnormality.
IMPRESSION: No suspicious mammographic or sonographic findings in the left
breast.

RECOMMENDATION:
1. Clinical follow-up recommended for the painful/swollen area of
concern in the left breast. Any further workup should be based on
clinical grounds.
2. The patient is due for annual diagnostic mammography in
approximately 1 month.

I have discussed the findings and recommendations with the patient.
If applicable, a reminder letter will be sent to the patient
regarding the next appointment.

BI-RADS CATEGORY  1: Negative.

## 2021-11-29 DIAGNOSIS — N926 Irregular menstruation, unspecified: Secondary | ICD-10-CM | POA: Diagnosis not present

## 2021-11-29 DIAGNOSIS — N760 Acute vaginitis: Secondary | ICD-10-CM | POA: Diagnosis not present

## 2021-11-30 DIAGNOSIS — N898 Other specified noninflammatory disorders of vagina: Secondary | ICD-10-CM | POA: Diagnosis not present

## 2021-12-05 DIAGNOSIS — M25531 Pain in right wrist: Secondary | ICD-10-CM | POA: Diagnosis not present

## 2022-01-02 DIAGNOSIS — M25531 Pain in right wrist: Secondary | ICD-10-CM | POA: Diagnosis not present

## 2022-01-05 DIAGNOSIS — E039 Hypothyroidism, unspecified: Secondary | ICD-10-CM | POA: Diagnosis not present

## 2022-01-05 DIAGNOSIS — E1165 Type 2 diabetes mellitus with hyperglycemia: Secondary | ICD-10-CM | POA: Diagnosis not present

## 2022-01-05 DIAGNOSIS — N951 Menopausal and female climacteric states: Secondary | ICD-10-CM | POA: Diagnosis not present

## 2022-01-05 DIAGNOSIS — I10 Essential (primary) hypertension: Secondary | ICD-10-CM | POA: Diagnosis not present

## 2022-01-05 DIAGNOSIS — E7800 Pure hypercholesterolemia, unspecified: Secondary | ICD-10-CM | POA: Diagnosis not present

## 2022-01-05 DIAGNOSIS — E282 Polycystic ovarian syndrome: Secondary | ICD-10-CM | POA: Diagnosis not present

## 2022-01-05 DIAGNOSIS — D509 Iron deficiency anemia, unspecified: Secondary | ICD-10-CM | POA: Diagnosis not present

## 2022-01-10 ENCOUNTER — Encounter (HOSPITAL_BASED_OUTPATIENT_CLINIC_OR_DEPARTMENT_OTHER): Payer: Self-pay | Admitting: Orthopedic Surgery

## 2022-01-10 ENCOUNTER — Other Ambulatory Visit: Payer: Self-pay

## 2022-01-18 NOTE — H&P (Signed)
? ?[name] is an 51 y.o. female.   ?Chief Complaint: RIGHT WRIST PAIN ? ?HPI: The patient is a 51y/o left hand dominant female who sustained an injury to the right wrist during pilates. She has tried a brace, rest, NSAIDs and a cortisone injection with no improvement.  ?She has continued to have pain, numbness, and weakness in the arm. MRI of the wrist was completed. Discussed the reason and rationale for surgical intervention.  ?She is here today for surgery.  ?She denies chest pain, shortness of breath, fever, chills, nausea, vomiting, and diarrhea.  ? ?Past Medical History:  ?Diagnosis Date  ? Breast cancer ([place])   ? Family history of breast cancer   ? Hypertension   ? Hypothyroidism   ? Invasive ductal carcinoma of right breast in female [facility]) 03/22/2015  ? er+/PR+/her2-  ? Menorrhagia   ? PCOS (polycystic ovarian syndrome)   ? PONV (postoperative nausea and vomiting)   ? Radiation 06/15/15-07/14/15  ? Right Breast ductal ca  ? ? ?Past Surgical History:  ?Procedure Laterality Date  ? ACHILLES TENDON REPAIR Right 2002  ? APPENDECTOMY  02-21-2009  ? BILATERAL SALPINGECTOMY Bilateral 07/27/2014  ? Procedure: BILATERAL SALPINGECTOMY;  Surgeon: [name], MD;  Location[facility];  Service: Gynecology;  Laterality: Bilateral;  ? BREAST BIOPSY Right 02/2015  ? BREAST LUMPECTOMY Right 04/28/2015  ? Breast, Right Needle Core Biospy Right 03/22/15  ? CESAREAN SECTION  10-09-2000  &  1999  ? EXCISION HAGLUND'S DEFORMITY WITH ACHILLES TENDON REPAIR Right 06/21/2016  ? Procedure: RIGHT ACHILLES TENDON DEBRIDEMENT AND RECONSTRUCTION; EXCISION HAGLUND DEFORMITY;  Surgeon: Wylene Simmer, MD;  Location: [place];  Service: Orthopedics;  Laterality: Right;  ? GASTROC RECESSION EXTREMITY Right 06/21/2016  ? Procedure: GASTROC RECESSION;  Surgeon: Wylene Simmer, MD;  Location: [place];  Service: Orthopedics;  Laterality: Right;  ? HYSTEROSCOPY WITH NOVASURE  11/ 2014  ?  LAPAROSCOPIC ASSISTED VAGINAL HYSTERECTOMY N/A 07/27/2014  ? Procedure: LAPAROSCOPIC ASSISTED VAGINAL HYSTERECTOMY;  Surgeon: [name], MD;  Location: [facility];  Service: Gynecology;  Laterality: N/A;  ? RADIOACTIVE SEED GUIDED PARTIAL MASTECTOMY WITH AXILLARY SENTINEL LYMPH NODE BIOPSY Right 04/28/2015  ? Procedure: RADIOACTIVE SEED GUIDED RIGHT BREAST PARTIAL MASTECTOMY WITH RIGHT AXILLARY SENTINEL LYMPH NODE BIOPSY;  Surgeon: [name], MD;  Location: [place];  Service: General;  Laterality: Right;  ? TONSILLECTOMY  age 51  ? ? ?Family History  ?Problem Relation Age of Onset  ? Breast cancer Maternal Aunt 41  ? Lung cancer Maternal Uncle   ?     smoker  ? Prostate cancer Paternal Uncle   ? Colon cancer Maternal Grandfather   ? ALS Paternal Grandfather   ? ?Social History:  reports that she has never smoked. She has never used smokeless tobacco. She reports current alcohol use of about 3.0 standard drinks per week. She reports that she does not use drugs. ? ?Allergies:  ?Allergies  ?Allergen Reactions  ? Ciprofloxacin Rash  ? Sulfa Antibiotics Nausea And Vomiting  ?  Fever ?  ? ? ?No medications prior to admission.  ? ? ?No results found for this or any previous visit (from the past 48 hour(s)). ?No results found. ? ?ROS NO RECENT ILLNESSES OR HOSPITALIZATIONS ? ?Height _0  (1.676 m), weight 72.6 kg, last menstrual period 07/14/2014. ?Physical Exam  ?General Appearance:  Alert, cooperative, no distress, appears stated age  ?Head:  Normocephalic, without obvious  abnormality, atraumatic  ?Eyes:  Pupils equal, conjunctiva/corneas clear,   ?   ?   ?Throat: Lips, mucosa, and tongue normal; teeth and gums normal  ?Neck: No visible masses  ?   ?Lungs:   respirations unlabored  ?Chest Wall:  No tenderness or deformity  ?Heart:  Regular rate and rhythm,  ?Abdomen:   Soft, non-tender,   ?   ?   ?Extremities: RUE: SKIN INTACT, FINGERS WARM WELL PERFUSED ?GOOD DIGITAL  MOTION  ?Pulses: 2+ and symmetric  ?Skin: Skin color, texture, turgor normal, no rashes or lesions  ?   ?Neurologic: Normal  ? ? ? ?Assessment/Plan ?RIGHT WRIST ULNAR NERVE COMPRESSION ?RIGHT WRIST PISIFORM TRIQUETRAL ARTHRITIS ?    - RIGHT WRIST ULNAR NERVE RELEASE AND ONE BONE CARPECTOMY, PISIFORM EXCISION ? ?R/B/A DISCUSSED WITH PT IN OFFICE.  ?PT VOICED UNDERSTANDING OF PLAN ?CONSENT SIGNED DAY OF SURGERY ?PT SEEN AND EXAMINED PRIOR TO OPERATIVE PROCEDURE/DAY OF SURGERY ?SITE MARKED. ?QUESTIONS ANSWERED ?WILL GO HOME FOLLOWING SURGERY  ? ?WE ARE PLANNING SURGERY FOR YOUR UPPER EXTREMITY. THE RISKS AND BENEFITS OF SURGERY INCLUDE BUT NOT LIMITED TO BLEEDING INFECTION, DAMAGE TO NEARBY NERVES ARTERIES TENDONS, FAILURE OF SURGERY TO ACCOMPLISH ITS INTENDED GOALS, PERSISTENT SYMPTOMS AND NEED FOR FURTHER SURGICAL INTERVENTION. WITH THIS IN MIND WE WILL PROCEED. I HAVE DISCUSSED WITH THE PATIENT THE PRE AND POSTOPERATIVE REGIMEN AND THE DOS AND DON'TS. PT VOICED UNDERSTANDING AND INFORMED CONSENT SIGNED.  ? ?Iran Planas MD  ? ?Brynda Peon ?01/18/2022, 8:37 AM ? ? ?

## 2022-01-22 ENCOUNTER — Ambulatory Visit (HOSPITAL_BASED_OUTPATIENT_CLINIC_OR_DEPARTMENT_OTHER)
Admission: RE | Admit: 2022-01-22 | Discharge: 2022-01-22 | Disposition: A | Discharge status: Home / Self Care | Payer: 59 | Source: Ambulatory Visit | Attending: Orthopedic Surgery | Admitting: Orthopedic Surgery

## 2022-01-22 ENCOUNTER — Ambulatory Visit (HOSPITAL_BASED_OUTPATIENT_CLINIC_OR_DEPARTMENT_OTHER): Payer: 59 | Admitting: Anesthesiology

## 2022-01-22 ENCOUNTER — Other Ambulatory Visit: Payer: Self-pay

## 2022-01-22 ENCOUNTER — Encounter (HOSPITAL_BASED_OUTPATIENT_CLINIC_OR_DEPARTMENT_OTHER): Payer: Self-pay | Admitting: Orthopedic Surgery

## 2022-01-22 ENCOUNTER — Encounter (HOSPITAL_BASED_OUTPATIENT_CLINIC_OR_DEPARTMENT_OTHER)
Admission: RE | Disposition: A | Discharge status: Home / Self Care | Payer: Self-pay | Source: Ambulatory Visit | Attending: Orthopedic Surgery

## 2022-01-22 ENCOUNTER — Ambulatory Visit (HOSPITAL_BASED_OUTPATIENT_CLINIC_OR_DEPARTMENT_OTHER): Payer: 59

## 2022-01-22 DIAGNOSIS — S6991XA Unspecified injury of right wrist, hand and finger(s), initial encounter: Secondary | ICD-10-CM | POA: Insufficient documentation

## 2022-01-22 DIAGNOSIS — M19031 Primary osteoarthritis, right wrist: Secondary | ICD-10-CM | POA: Insufficient documentation

## 2022-01-22 DIAGNOSIS — M25531 Pain in right wrist: Secondary | ICD-10-CM | POA: Insufficient documentation

## 2022-01-22 DIAGNOSIS — Z853 Personal history of malignant neoplasm of breast: Secondary | ICD-10-CM | POA: Diagnosis not present

## 2022-01-22 DIAGNOSIS — E039 Hypothyroidism, unspecified: Secondary | ICD-10-CM

## 2022-01-22 DIAGNOSIS — Y93B4 Activity, pilates: Secondary | ICD-10-CM | POA: Insufficient documentation

## 2022-01-22 DIAGNOSIS — I10 Essential (primary) hypertension: Secondary | ICD-10-CM | POA: Diagnosis not present

## 2022-01-22 HISTORY — DX: Other specified postprocedural states: Z98.890

## 2022-01-22 HISTORY — PX: ULNAR NERVE TRANSPOSITION: SHX2595

## 2022-01-22 HISTORY — DX: Nausea with vomiting, unspecified: R11.2

## 2022-01-22 SURGERY — ULNAR NERVE DECOMPRESSION/TRANSPOSITION
Anesthesia: Monitor Anesthesia Care | Site: Hand | Laterality: Right

## 2022-01-22 MED ORDER — PROPOFOL 10 MG/ML IV BOLUS
INTRAVENOUS | Status: DC | PRN
  Administered 2022-01-22 (×4): 20 mg via INTRAVENOUS

## 2022-01-22 MED ORDER — ONDANSETRON HCL 4 MG/2ML IJ SOLN: 4.0000 mg | Freq: Once | INTRAMUSCULAR | Status: DC | PRN

## 2022-01-22 MED ORDER — KETAMINE HCL 50 MG/5ML IJ SOSY
PREFILLED_SYRINGE | INTRAMUSCULAR | Status: AC
  Filled 2022-01-22: qty 5

## 2022-01-22 MED ORDER — PROPOFOL 500 MG/50ML IV EMUL
INTRAVENOUS | Status: DC | PRN
  Administered 2022-01-22: 75 ug/kg/min via INTRAVENOUS

## 2022-01-22 MED ORDER — 0.9 % SODIUM CHLORIDE (POUR BTL) OPTIME
TOPICAL | Status: DC | PRN
  Administered 2022-01-22: 100 mL

## 2022-01-22 MED ORDER — PROPOFOL 500 MG/50ML IV EMUL
INTRAVENOUS | Status: AC
  Filled 2022-01-22: qty 50

## 2022-01-22 MED ORDER — ACETAMINOPHEN 500 MG PO TABS
ORAL_TABLET | ORAL | Status: AC
  Filled 2022-01-22: qty 2

## 2022-01-22 MED ORDER — CEFAZOLIN SODIUM-DEXTROSE 2-4 GM/100ML-% IV SOLN
2.0000 g | INTRAVENOUS | Status: AC
  Administered 2022-01-22: 2 g via INTRAVENOUS

## 2022-01-22 MED ORDER — AMISULPRIDE (ANTIEMETIC) 5 MG/2ML IV SOLN
10.0000 mg | Freq: Once | INTRAVENOUS | Status: AC | PRN
  Administered 2022-01-22: 10 mg via INTRAVENOUS

## 2022-01-22 MED ORDER — DEXAMETHASONE SODIUM PHOSPHATE 10 MG/ML IJ SOLN
INTRAMUSCULAR | Status: DC | PRN
  Administered 2022-01-22: 5 mg

## 2022-01-22 MED ORDER — ROPIVACAINE HCL 5 MG/ML IJ SOLN
INTRAMUSCULAR | Status: DC | PRN
Start: 2022-01-22 — End: 2022-01-22
  Administered 2022-01-22: 30 mL via PERINEURAL

## 2022-01-22 MED ORDER — ONDANSETRON HCL 4 MG/2ML IJ SOLN
INTRAMUSCULAR | Status: AC
  Filled 2022-01-22: qty 2

## 2022-01-22 MED ORDER — FENTANYL CITRATE (PF) 100 MCG/2ML IJ SOLN
INTRAMUSCULAR | Status: DC | PRN
  Administered 2022-01-22: 25 ug via INTRAVENOUS

## 2022-01-22 MED ORDER — ONDANSETRON HCL 4 MG/2ML IJ SOLN
INTRAMUSCULAR | Status: DC | PRN
Start: 2022-01-22 — End: 2022-01-22
  Administered 2022-01-22: 4 mg via INTRAVENOUS

## 2022-01-22 MED ORDER — FENTANYL CITRATE (PF) 100 MCG/2ML IJ SOLN
100.0000 ug | Freq: Once | INTRAMUSCULAR | Status: AC
  Administered 2022-01-22: 100 ug via INTRAVENOUS

## 2022-01-22 MED ORDER — MIDAZOLAM HCL 2 MG/2ML IJ SOLN
INTRAMUSCULAR | Status: AC
  Filled 2022-01-22: qty 2

## 2022-01-22 MED ORDER — LACTATED RINGERS IV SOLN
INTRAVENOUS | Status: DC
Start: 2022-01-22 — End: 2022-01-22

## 2022-01-22 MED ORDER — KETAMINE HCL 10 MG/ML IJ SOLN
INTRAMUSCULAR | Status: DC | PRN
  Administered 2022-01-22: 10 mg via INTRAVENOUS

## 2022-01-22 MED ORDER — PROPOFOL 10 MG/ML IV BOLUS
INTRAVENOUS | Status: AC
  Filled 2022-01-22: qty 20

## 2022-01-22 MED ORDER — AMISULPRIDE (ANTIEMETIC) 5 MG/2ML IV SOLN
INTRAVENOUS | Status: AC
  Filled 2022-01-22: qty 4

## 2022-01-22 MED ORDER — FENTANYL CITRATE (PF) 100 MCG/2ML IJ SOLN: 25.0000 ug | INTRAMUSCULAR | Status: DC | PRN

## 2022-01-22 MED ORDER — FENTANYL CITRATE (PF) 100 MCG/2ML IJ SOLN
INTRAMUSCULAR | Status: AC
  Filled 2022-01-22: qty 2

## 2022-01-22 MED ORDER — ACETAMINOPHEN 500 MG PO TABS
1000.0000 mg | ORAL_TABLET | Freq: Once | ORAL | Status: AC
  Administered 2022-01-22: 1000 mg via ORAL

## 2022-01-22 MED ORDER — MIDAZOLAM HCL 2 MG/2ML IJ SOLN
2.0000 mg | Freq: Once | INTRAMUSCULAR | Status: AC
  Administered 2022-01-22: 2 mg via INTRAVENOUS

## 2022-01-22 MED ORDER — OXYCODONE HCL 5 MG/5ML PO SOLN: 5.0000 mg | Freq: Once | ORAL | Status: DC | PRN

## 2022-01-22 MED ORDER — OXYCODONE HCL 5 MG PO TABS: 5.0000 mg | ORAL_TABLET | Freq: Once | ORAL | Status: DC | PRN

## 2022-01-22 MED ORDER — CEFAZOLIN SODIUM-DEXTROSE 2-4 GM/100ML-% IV SOLN
INTRAVENOUS | Status: AC
  Filled 2022-01-22: qty 100

## 2022-01-22 MED ORDER — FENTANYL CITRATE (PF) 100 MCG/2ML IJ SOLN
INTRAMUSCULAR | Status: AC
Start: 2022-01-22 — End: ?
  Filled 2022-01-22: qty 2

## 2022-01-22 SURGICAL SUPPLY — 57 items
BLADE SURG 15 STRL LF DISP TIS (BLADE) ×1 IMPLANT
BLADE SURG 15 STRL SS (BLADE) ×2
BNDG CMPR 9X4 STRL LF SNTH (GAUZE/BANDAGES/DRESSINGS) ×1
BNDG CONFORM 2 STRL LF (GAUZE/BANDAGES/DRESSINGS) IMPLANT
BNDG ELASTIC 2X5.8 VLCR STR LF (GAUZE/BANDAGES/DRESSINGS) IMPLANT
BNDG ELASTIC 3X5.8 VLCR STR LF (GAUZE/BANDAGES/DRESSINGS) IMPLANT
BNDG ELASTIC 4X5.8 VLCR STR LF (GAUZE/BANDAGES/DRESSINGS) IMPLANT
BNDG ESMARK 4X9 LF (GAUZE/BANDAGES/DRESSINGS) ×2 IMPLANT
BNDG GAUZE ELAST 4 BULKY (GAUZE/BANDAGES/DRESSINGS) IMPLANT
CORD BIPOLAR FORCEPS 12FT (ELECTRODE) ×2 IMPLANT
COVER BACK TABLE 60X90IN (DRAPES) ×2 IMPLANT
COVER MAYO STAND STRL (DRAPES) ×2 IMPLANT
CUFF TOURN SGL QUICK 18X4 (TOURNIQUET CUFF) IMPLANT
DRAPE EXTREMITY T 121X128X90 (DISPOSABLE) ×2 IMPLANT
DRAPE SURG 17X23 STRL (DRAPES) ×2 IMPLANT
DRSG EMULSION OIL 3X3 NADH (GAUZE/BANDAGES/DRESSINGS) ×2 IMPLANT
GAUZE SPONGE 4X4 12PLY STRL (GAUZE/BANDAGES/DRESSINGS) ×2 IMPLANT
GAUZE XEROFORM 1X8 LF (GAUZE/BANDAGES/DRESSINGS) IMPLANT
GLOVE SURG ENC MOIS LTX SZ6.5 (GLOVE) ×2 IMPLANT
GLOVE SURG ORTHO LTX SZ8 (GLOVE) ×2 IMPLANT
GLOVE SURG UNDER POLY LF SZ6.5 (GLOVE) ×3 IMPLANT
GLOVE SURG UNDER POLY LF SZ7 (GLOVE) ×1 IMPLANT
GLOVE SURG UNDER POLY LF SZ8.5 (GLOVE) ×2 IMPLANT
GOWN STRL REUS W/ TWL LRG LVL3 (GOWN DISPOSABLE) ×1 IMPLANT
GOWN STRL REUS W/ TWL XL LVL3 (GOWN DISPOSABLE) ×1 IMPLANT
GOWN STRL REUS W/TWL LRG LVL3 (GOWN DISPOSABLE) ×2
GOWN STRL REUS W/TWL XL LVL3 (GOWN DISPOSABLE) ×2
LOOP VESSEL MAXI BLUE (MISCELLANEOUS) IMPLANT
LOOP VESSEL MINI RED (MISCELLANEOUS) IMPLANT
NDL HYPO 25X1 1.5 SAFETY (NEEDLE) IMPLANT
NEEDLE HYPO 25X1 1.5 SAFETY (NEEDLE) IMPLANT
NS IRRIG 1000ML POUR BTL (IV SOLUTION) ×2 IMPLANT
PACK BASIN DAY SURGERY FS (CUSTOM PROCEDURE TRAY) ×2 IMPLANT
PAD CAST 4YDX4 CTTN HI CHSV (CAST SUPPLIES) IMPLANT
PADDING CAST ABS 4INX4YD NS (CAST SUPPLIES) ×1
PADDING CAST ABS COTTON 4X4 ST (CAST SUPPLIES) ×1 IMPLANT
PADDING CAST COTTON 4X4 STRL (CAST SUPPLIES)
SLING ARM FOAM STRAP LRG (SOFTGOODS) ×1 IMPLANT
SPLINT FIBERGLASS 3X35 (CAST SUPPLIES) ×1 IMPLANT
SPLINT FIBERGLASS 4X30 (CAST SUPPLIES) IMPLANT
STOCKINETTE 4X48 STRL (DRAPES) ×2 IMPLANT
SUCTION FRAZIER HANDLE 10FR (MISCELLANEOUS)
SUCTION TUBE FRAZIER 10FR DISP (MISCELLANEOUS) IMPLANT
SUT ETHIBOND 3-0 V-5 (SUTURE) IMPLANT
SUT ETHILON 4 0 PS 2 18 (SUTURE) IMPLANT
SUT FIBERWIRE 3-0 18 TAPR NDL (SUTURE)
SUT MERSILENE 4 0 P 3 (SUTURE) IMPLANT
SUT PROLENE 4 0 PS 2 18 (SUTURE) ×3 IMPLANT
SUT VIC AB 2-0 SH 27 (SUTURE)
SUT VIC AB 2-0 SH 27XBRD (SUTURE) IMPLANT
SUT VIC AB 3-0 FS2 27 (SUTURE) ×2 IMPLANT
SUTURE FIBERWR 3-0 18 TAPR NDL (SUTURE) IMPLANT
SYR BULB EAR ULCER 3OZ GRN STR (SYRINGE) ×2 IMPLANT
SYR CONTROL 10ML LL (SYRINGE) IMPLANT
TRAY DSU PREP LF (CUSTOM PROCEDURE TRAY) ×2 IMPLANT
TUBE CONNECTING 20X1/4 (TUBING) IMPLANT
UNDERPAD 30X36 HEAVY ABSORB (UNDERPADS AND DIAPERS) ×2 IMPLANT

## 2022-01-22 NOTE — Anesthesia Postprocedure Evaluation (Signed)
Anesthesia Post Note ? ?Patient: [name] ? ?Procedure(s) Performed: Right wrist ulnar nerve release and 1 bone carpectomy, pisiform excision (Right: Hand) ? ?  ? ?Patient location during evaluation: PACU ?Anesthesia Type: Regional ?Level of consciousness: awake and alert ?Pain management: pain level controlled ?Vital Signs Assessment: post-procedure vital signs reviewed and stable ?Respiratory status: spontaneous breathing, nonlabored ventilation and respiratory function stable ?Cardiovascular status: blood pressure returned to baseline and stable ?Postop Assessment: no apparent nausea or vomiting ?Anesthetic complications: no ? ? ?No notable events documented. ? ?Last Vitals:  ?Vitals:  ? 01/22/22 1415 01/22/22 1430  ?BP: 138/88 (!) 147/94  ?Pulse: 66 70  ?Resp: 17 (!) 21  ?Temp:    ?SpO2: 98% 93%  ?  ?Last Pain:  ?Vitals:  ? 01/22/22 1430  ?TempSrc:   ?PainSc: 0-No pain  ? ? ?  ?  ?  ?  ?  ?  ? ?Lidia Collum ? ? ? ? ?

## 2022-01-22 NOTE — Discharge Instructions (Addendum)
KEEP BANDAGE CLEAN AND DRY ?CALL OFFICE FOR F/U APPT [phone] IN 15 DAYS ?KEEP HAND ELEVATED ABOVE HEART ?Rx sent to [place] (Percocet 10-'325mg'$  and Zofran '4mg'$ ) ?DR Caralyn Guile [phone] ?OK TO APPLY ICE TO OPERATIVE AREA ?CONTACT OFFICE IF ANY WORSENING PAIN OR CONCERNS.  ?Regional Anesthesia Blocks ? ?1. Numbness or the inability to move the "blocked" extremity may last from 3-48 hours after placement. The length of time depends on the medication injected and your individual response to the medication. If the numbness is not going away after 48 hours, call your surgeon. ? ?2. The extremity that is blocked will need to be protected until the numbness is gone and the  Strength has returned. Because you cannot feel it, you will need to take extra care to avoid injury. Because it may be weak, you may have difficulty moving it or using it. You may not know what position it is in without looking at it while the block is in effect. ? ?3. For blocks in the legs and feet, returning to weight bearing and walking needs to be done carefully. You will need to wait until the numbness is entirely gone and the strength has returned. You should be able to move your leg and foot normally before you try and bear weight or walk. You will need someone to be with you when you first try to ensure you do not fall and possibly risk injury. ? ?4. Bruising and tenderness at the needle site are common side effects and will resolve in a few days. ? ?5. Persistent numbness or new problems with movement should be communicated to the surgeon or the [place] [phone] [place] [phone]).  ?Post Anesthesia Home Care Instructions ? ?Activity: ?Get plenty of rest for the remainder of the day. A responsible individual must stay with you for 24 hours following the procedure.  ?For the next 24 hours, DO NOT: ?-Drive a car ?-Paediatric nurse ?-Drink alcoholic beverages ?-Take any medication  unless instructed by your physician ?-Make any legal decisions or sign important papers. ? ?Meals: ?Start with liquid foods such as gelatin or soup. Progress to regular foods as tolerated. Avoid greasy, spicy, heavy foods. If nausea and/or vomiting occur, drink only clear liquids until the nausea and/or vomiting subsides. Call your physician if vomiting continues. ? ?Special Instructions/Symptoms: ?Your throat may feel dry or sore from the anesthesia or the breathing tube placed in your throat during surgery. If this causes discomfort, gargle with warm salt water. The discomfort should disappear within 24 hours. ? ?If you had a scopolamine patch placed behind your ear for the management of post- operative nausea and/or vomiting: ? ?1. The medication in the patch is effective for 72 hours, after which it should be removed.  Wrap patch in a tissue and discard in the trash. Wash hands thoroughly with soap and water. ?2. You may remove the patch earlier than 72 hours if you experience unpleasant side effects which may include dry mouth, dizziness or visual disturbances. ?3. Avoid touching the patch. Wash your hands with soap and water after contact with the patch. ?    ?

## 2022-01-22 NOTE — Anesthesia Procedure Notes (Signed)
Anesthesia Regional Block: Supraclavicular block  ? ?Pre-Anesthetic Checklist: , timeout performed,  Correct Patient, Correct Site, Correct Laterality,  Correct Procedure, Correct Position, site marked,  Risks and benefits discussed,  Surgical consent,  Pre-op evaluation,  At surgeon's request and post-op pain management ? ?Laterality: Right ? ?Prep: chloraprep     ?  ?Needles:  ?Injection technique: Single-shot ? ?Needle Type: Echogenic Stimulator Needle   ? ? ?Needle Length: 10cm  ?Needle Gauge: 20  ? ? ? ?Additional Needles: ? ? ?Procedures:,,,, ultrasound used (permanent image in chart),,    ?Narrative:  ?Start time: 01/22/2022 12:45 PM ?End time: 01/22/2022 12:49 PM ?Injection made incrementally with aspirations every 5 mL. ? ?Performed by: Personally  ?Anesthesiologist: Lidia Collum, MD ? ?Additional Notes: ?Standard monitors applied. Skin prepped. Good needle visualization with ultrasound. Injection made in 5cc increments with no resistance to injection. Patient tolerated the procedure well. ? ? ? ? ?

## 2022-01-22 NOTE — Anesthesia Procedure Notes (Signed)
Procedure Name: [place] ?Date/Time: 01/22/2022 1:00 PM ?Performed by: Glory Buff, CRNA ?Pre-anesthesia Checklist: Patient identified, Emergency Drugs available, Suction available and Patient being monitored ?Patient Re-evaluated:Patient Re-evaluated prior to induction ?Oxygen Delivery Method: Simple face mask ? ? ? ? ?

## 2022-01-22 NOTE — Op Note (Signed)
PREOPERATIVE DIAGNOSIS: Right wrist pisiform triquetrum pain and arthritis ? ?POSTOPERATIVE DIAGNOSIS: Same ? ?ATTENDING SURGEON: Dr. Iran Planas who scrubbed and present for the entire procedure ? ?ASSISTANT SURGEON: Gertie Fey, PA-C was scrubbed and necessary for exposure 1 bone carpectomy closure and splinting in a timely fashion ? ?ANESTHESIA: Regional with IV sedation ? ?OPERATIVE PROCEDURE: ?Right wrist 1 bone carpectomy, pisiform ?Right wrist ulnar nerve release at the level of the wrist ?Radiographs 3 views right wrist ? ?IMPLANTS: None ? ?EBL: Minimal ? ?RADIOGRAPHIC INTERPRETATION: AP lateral oblique views of the wrist do show the removal of the pisiform.  There is good radiocarpal midcarpal joint alignment ? ?SURGICAL INDICATIONS: Patient is a right-hand-dominant female with persistent ulnar-sided wrist pain.  Patient elected undergo the above procedure.  Risks of surgery include but not limited to bleeding infection damage nearby nerves arteries or tendons persistent pain and need for further surgical invention.  Signed informed consent was obtained the day of surgery. ? ?SURGICAL TECHNIQUE: The patient was prepped identified in the preoperative holding area marked apart a marker made on the right wrist indicate correct operative site.  Patient brought back to operating placed supine on anesthesia table where the regional anesthetic was administered.  Patient tolerated this well.  Well-padded tourniquet was placed on the right brachium and sealed with the appropriate drape.  The right upper extremities then prepped and draped normal sterile fashion.  A timeout was called the correct site was identified the procedure then begun.  Attention was then turned to the right wrist.  A Bruner type incision made directly over the wrist crease skin incision extended proximally and distally.  Dissection carried down through the skin and subcutaneous tissue.  Preoperative antibiotics were given prior to skin  incision.  Subcutaneous tissues were then carefully dissected free and the flexor carpi ulnaris tendon was then identified.  Just radial to the FCU the ulnar nerve and artery were then carefully identified and the nerve was then identified and carefully dissected and released proximally and distally.  After nerve release the nerve was able to be carefully mobilized with blunt instrumentation taken in a radial direction.  A longitudinal incision made directly over the FCU.  Once circumferential dissection was carried around the pisiform and the pisiform was removed in its entirety.  Final radiographs were then obtained.  The wound was thoroughly irrigated.  The patient did have the arthrosis noted within the pisiform and triquetral articulation.  The tourniquet deflated.  Hemostasis was then obtained.  The FCU interval was then closed with 3-0 Vicryl.  The skin was then closed using simple Prolene suture.  Adaptic dressing a sterile compressive bandage then applied.  The patient was placed in a well-padded volar splint taken recovery room in good condition. ? ?POSTOPERATIVE PLAN: Patient be discharged home.  See him back in the office in 2 weeks for wound check suture removal x-rays transition of a short arm wrist brace gradual use and activity ?

## 2022-01-22 NOTE — Transfer of Care (Signed)
Immediate Anesthesia Transfer of Care Note ? ?Patient: [name] ? ?Procedure(s) Performed: Right wrist ulnar nerve release and 1 bone carpectomy, pisiform excision (Right: Hand) ? ?Patient Location: PACU ? ?Anesthesia Type:MAC and Regional ? ?Level of Consciousness: awake, alert  and oriented ? ?Airway & Oxygen Therapy: Patient Spontanous Breathing and Patient connected to face mask oxygen ? ?Post-op Assessment: Report given to RN and Post -op Vital signs reviewed and stable ? ?Post vital signs: Reviewed and stable ? ?Last Vitals:  ?Vitals Value Taken Time  ?BP    ?Temp    ?Pulse 73 01/22/22 1410  ?Resp 21 01/22/22 1410  ?SpO2 98 % 01/22/22 1410  ?Vitals shown include unvalidated device data. ? ?Last Pain:  ?Vitals:  ? 01/22/22 1223  ?TempSrc: Oral  ?PainSc: 4   ?   ? ?Patients Stated Pain Goal: 6 (01/22/22 1223) ? ?Complications: No notable events documented. ?

## 2022-01-22 NOTE — Anesthesia Preprocedure Evaluation (Signed)
Anesthesia Evaluation  ?Patient identified by MRN, date of birth, ID band ?Patient awake ? ? ? ?Reviewed: ?Allergy & Precautions, NPO status , Patient's Chart, lab work & pertinent test results ? ?History of Anesthesia Complications ?Negative for: history of anesthetic complications ? ?Airway ?Mallampati: II ? ?TM Distance: >3 FB ?Neck ROM: Full ? ? ? Dental ?  ?Pulmonary ?neg pulmonary ROS,  ?  ?Pulmonary exam normal ? ? ? ? ? ? ? Cardiovascular ?hypertension, Normal cardiovascular exam ? ? ?  ?Neuro/Psych ?negative neurological ROS ?   ? GI/Hepatic ?negative GI ROS, Neg liver ROS,   ?Endo/Other  ?Hypothyroidism  ? Renal/GU ?negative Renal ROS  ?negative genitourinary ?  ?Musculoskeletal ?negative musculoskeletal ROS ?(+)  ? Abdominal ?  ?Peds ? Hematology ?negative hematology ROS ?(+)   ?Anesthesia Other Findings ?H/o breast cancer ? Reproductive/Obstetrics ? ?  ? ? ? ? ? ? ? ? ? ? ? ? ? ?  ?  ? ? ? ? ? ? ? ? ?Anesthesia Physical ?Anesthesia Plan ? ?ASA: 2 ? ?Anesthesia Plan: MAC and Regional  ? ?Post-op Pain Management: Regional block* and Tylenol PO (pre-op)*  ? ?Induction: Intravenous ? ?PONV Risk Score and Plan: 2 and Propofol infusion, TIVA and Treatment may vary due to age or medical condition ? ?Airway Management Planned: Natural Airway, Nasal Cannula and Simple Face Mask ? ?Additional Equipment: None ? ?Intra-op Plan:  ? ?Post-operative Plan:  ? ?Informed Consent: I have reviewed the patients History and Physical, chart, labs and discussed the procedure including the risks, benefits and alternatives for the proposed anesthesia with the patient or authorized representative who has indicated his/her understanding and acceptance.  ? ? ? ? ? ?Plan Discussed with:  ? ?Anesthesia Plan Comments:   ? ? ? ? ? ? ?Anesthesia Quick Evaluation ? ?

## 2022-01-22 NOTE — Progress Notes (Signed)
Assisted Dr. Christella Hartigan with right, supraclavicular, ultrasound guided block. Side rails up, monitors on throughout procedure. See vital signs in flow sheet. Tolerated Procedure well. ?

## 2022-01-23 ENCOUNTER — Encounter (HOSPITAL_BASED_OUTPATIENT_CLINIC_OR_DEPARTMENT_OTHER): Payer: Self-pay | Admitting: Orthopedic Surgery

## 2022-02-05 DIAGNOSIS — M25531 Pain in right wrist: Secondary | ICD-10-CM | POA: Diagnosis not present

## 2022-02-05 DIAGNOSIS — Z4789 Encounter for other orthopedic aftercare: Secondary | ICD-10-CM | POA: Diagnosis not present

## 2022-03-13 DIAGNOSIS — M25631 Stiffness of right wrist, not elsewhere classified: Secondary | ICD-10-CM | POA: Diagnosis not present

## 2022-03-16 ENCOUNTER — Other Ambulatory Visit: Payer: Self-pay | Admitting: Hematology and Oncology

## 2022-03-22 DIAGNOSIS — M25531 Pain in right wrist: Secondary | ICD-10-CM | POA: Diagnosis not present

## 2022-03-29 DIAGNOSIS — M25531 Pain in right wrist: Secondary | ICD-10-CM | POA: Diagnosis not present

## 2022-04-02 ENCOUNTER — Other Ambulatory Visit: Payer: Self-pay | Admitting: Hematology and Oncology

## 2022-04-03 ENCOUNTER — Telehealth: Payer: Self-pay | Admitting: Hematology and Oncology

## 2022-04-03 DIAGNOSIS — M25531 Pain in right wrist: Secondary | ICD-10-CM | POA: Diagnosis not present

## 2022-04-03 NOTE — Telephone Encounter (Signed)
Scheduled appointment per 6/5 scheduling message. Unable to leave a voicemail due to mailbox being full. Patient will be mailed an updated calendar.

## 2022-04-06 DIAGNOSIS — M25531 Pain in right wrist: Secondary | ICD-10-CM | POA: Diagnosis not present

## 2022-04-09 DIAGNOSIS — M25631 Stiffness of right wrist, not elsewhere classified: Secondary | ICD-10-CM | POA: Diagnosis not present

## 2022-04-16 DIAGNOSIS — M25531 Pain in right wrist: Secondary | ICD-10-CM | POA: Diagnosis not present

## 2022-04-24 DIAGNOSIS — M25531 Pain in right wrist: Secondary | ICD-10-CM | POA: Diagnosis not present

## 2022-04-24 DIAGNOSIS — I10 Essential (primary) hypertension: Secondary | ICD-10-CM | POA: Diagnosis not present

## 2022-05-03 DIAGNOSIS — M25531 Pain in right wrist: Secondary | ICD-10-CM | POA: Diagnosis not present

## 2022-05-03 DIAGNOSIS — M25631 Stiffness of right wrist, not elsewhere classified: Secondary | ICD-10-CM | POA: Diagnosis not present

## 2022-05-03 DIAGNOSIS — Z4789 Encounter for other orthopedic aftercare: Secondary | ICD-10-CM | POA: Diagnosis not present

## 2022-05-09 DIAGNOSIS — Z760 Encounter for issue of repeat prescription: Secondary | ICD-10-CM | POA: Diagnosis not present

## 2022-05-09 DIAGNOSIS — Z01419 Encounter for gynecological examination (general) (routine) without abnormal findings: Secondary | ICD-10-CM | POA: Diagnosis not present

## 2022-05-09 DIAGNOSIS — Z124 Encounter for screening for malignant neoplasm of cervix: Secondary | ICD-10-CM | POA: Diagnosis not present

## 2022-05-09 DIAGNOSIS — Z6825 Body mass index (BMI) 25.0-25.9, adult: Secondary | ICD-10-CM | POA: Diagnosis not present

## 2022-05-10 DIAGNOSIS — M25531 Pain in right wrist: Secondary | ICD-10-CM | POA: Diagnosis not present

## 2022-05-17 DIAGNOSIS — M25531 Pain in right wrist: Secondary | ICD-10-CM | POA: Diagnosis not present

## 2022-05-21 DIAGNOSIS — M25531 Pain in right wrist: Secondary | ICD-10-CM | POA: Diagnosis not present

## 2022-05-28 DIAGNOSIS — M25631 Stiffness of right wrist, not elsewhere classified: Secondary | ICD-10-CM | POA: Diagnosis not present

## 2022-06-04 ENCOUNTER — Inpatient Hospital Stay: Payer: 59 | Admitting: Hematology and Oncology

## 2022-06-04 NOTE — Assessment & Plan Note (Deleted)
Right lumpectomy 04/28/2015: Invasive ductal carcinoma grade 1, 0.9 cm, low-grade DCIS, margins negative, 0/6 sentinel nodes negative, ER 98%, PR 98%, HER-2 negative, Ki-67 6%, T1 BN 0 stage IA, Oncotype DX score 10, 7% risk of recurrence, Adj radiation therapy 06/15/2015 to 07/14/15  Current Treatment: Tamoxifen 20 mg daily started 07/30/15 completed 2021 1 breast cancer index showed that she does not need to extend antiestrogen therapy  Tamoxifen toxicities: 1. Occasional hot flashes 2. Emotional outbursts:Markedly improved on Effexor [place] and [place] been closed because of COVID-19.   Surveillance: 1. Breast exam 8/7/2023benign 2. mammogram9/29/2022: Benign breast density category C 3. Breast MRI 04/19/21 Benign Density Cat C  Breast cancer index: Low risk and hence tamoxifen was discontinued Return to clinic on an as-needed basis.

## 2022-06-07 DIAGNOSIS — L814 Other melanin hyperpigmentation: Secondary | ICD-10-CM | POA: Diagnosis not present

## 2022-06-07 DIAGNOSIS — D2272 Melanocytic nevi of left lower limb, including hip: Secondary | ICD-10-CM | POA: Diagnosis not present

## 2022-06-07 DIAGNOSIS — D2271 Melanocytic nevi of right lower limb, including hip: Secondary | ICD-10-CM | POA: Diagnosis not present

## 2022-06-07 DIAGNOSIS — D1801 Hemangioma of skin and subcutaneous tissue: Secondary | ICD-10-CM | POA: Diagnosis not present

## 2022-06-07 DIAGNOSIS — D225 Melanocytic nevi of trunk: Secondary | ICD-10-CM | POA: Diagnosis not present

## 2022-06-07 DIAGNOSIS — R52 Pain, unspecified: Secondary | ICD-10-CM | POA: Diagnosis not present

## 2022-06-07 DIAGNOSIS — L218 Other seborrheic dermatitis: Secondary | ICD-10-CM | POA: Diagnosis not present

## 2022-06-07 DIAGNOSIS — D235 Other benign neoplasm of skin of trunk: Secondary | ICD-10-CM | POA: Diagnosis not present

## 2022-06-11 DIAGNOSIS — R591 Generalized enlarged lymph nodes: Secondary | ICD-10-CM | POA: Diagnosis not present

## 2022-06-13 ENCOUNTER — Inpatient Hospital Stay: Payer: 59 | Attending: Hematology and Oncology | Admitting: Hematology and Oncology

## 2022-06-13 NOTE — Assessment & Plan Note (Deleted)
Right lumpectomy 04/28/2015: Invasive ductal carcinoma grade 1, 0.9 cm, low-grade DCIS, margins negative, 0/6 sentinel nodes negative, ER 98%, PR 98%, HER-2 negative, Ki-67 6%, T1 BN 0 stage IA, Oncotype DX score 10, 7% risk of recurrence, Adj radiation therapy 06/15/2015 to 07/14/15  Current Treatment: Tamoxifen 20 mg daily started 07/30/15 completed 2021 1 breast cancer index showed that she does not need to extend antiestrogen therapy  Tamoxifen toxicities: 1. Occasional hot flashes 2. Emotional outbursts:Markedly improved on Effexor [place] [employer] and [place] has been closed because of COVID-19.   Surveillance: 1. Breast exam8/16/2023benign 2. mammogram9/29/2020: Benign breast density category C 3. Breast MRI 04/19/20 Benign Density Cat C  Breast cancer index: Low risk and hence tamoxifen was discontinued Return to clinic on an as-needed basis

## 2022-06-14 DIAGNOSIS — M25531 Pain in right wrist: Secondary | ICD-10-CM | POA: Diagnosis not present

## 2022-06-23 ENCOUNTER — Other Ambulatory Visit: Payer: Self-pay | Admitting: Hematology and Oncology

## 2022-07-06 ENCOUNTER — Telehealth: Payer: Self-pay | Admitting: Hematology and Oncology

## 2022-07-06 NOTE — Telephone Encounter (Signed)
Scheduled appointment per 8/28 staff message. Left voicemail.

## 2022-07-10 DIAGNOSIS — D509 Iron deficiency anemia, unspecified: Secondary | ICD-10-CM | POA: Diagnosis not present

## 2022-07-10 DIAGNOSIS — E1165 Type 2 diabetes mellitus with hyperglycemia: Secondary | ICD-10-CM | POA: Diagnosis not present

## 2022-07-10 DIAGNOSIS — E039 Hypothyroidism, unspecified: Secondary | ICD-10-CM | POA: Diagnosis not present

## 2022-07-13 DIAGNOSIS — M25531 Pain in right wrist: Secondary | ICD-10-CM | POA: Diagnosis not present

## 2022-07-16 DIAGNOSIS — E7800 Pure hypercholesterolemia, unspecified: Secondary | ICD-10-CM | POA: Diagnosis not present

## 2022-07-16 DIAGNOSIS — R945 Abnormal results of liver function studies: Secondary | ICD-10-CM | POA: Diagnosis not present

## 2022-07-16 DIAGNOSIS — E282 Polycystic ovarian syndrome: Secondary | ICD-10-CM | POA: Diagnosis not present

## 2022-07-16 DIAGNOSIS — E039 Hypothyroidism, unspecified: Secondary | ICD-10-CM | POA: Diagnosis not present

## 2022-07-16 DIAGNOSIS — D509 Iron deficiency anemia, unspecified: Secondary | ICD-10-CM | POA: Diagnosis not present

## 2022-07-16 DIAGNOSIS — I10 Essential (primary) hypertension: Secondary | ICD-10-CM | POA: Diagnosis not present

## 2022-07-16 DIAGNOSIS — N951 Menopausal and female climacteric states: Secondary | ICD-10-CM | POA: Diagnosis not present

## 2022-07-16 DIAGNOSIS — E1165 Type 2 diabetes mellitus with hyperglycemia: Secondary | ICD-10-CM | POA: Diagnosis not present

## 2022-07-28 ENCOUNTER — Other Ambulatory Visit: Payer: Self-pay | Admitting: Hematology and Oncology

## 2022-08-10 ENCOUNTER — Telehealth: Payer: Self-pay | Admitting: Hematology and Oncology

## 2022-08-10 NOTE — Telephone Encounter (Signed)
Contacted patient to scheduled appointments. Left message with appointment details and a call back number if patient had any questions or could not accommodate the time we provided.   

## 2022-08-13 ENCOUNTER — Inpatient Hospital Stay: Payer: 59 | Admitting: Hematology and Oncology

## 2022-08-16 NOTE — Progress Notes (Incomplete)
Patient Care Team: Carol Ada, MD as PCP - General (Family Medicine) Nicholas Lose, MD as Consulting Physician (Hematology and Oncology) Thea Silversmith, MD as Consulting Physician (Radiation Oncology) [name], MD as Consulting Physician (General Surgery) Sylvan Cheese, NP as Nurse Practitioner (Hematology and Oncology)  DIAGNOSIS: No diagnosis found.  SUMMARY OF ONCOLOGIC HISTORY: Oncology History  Breast cancer of upper-outer quadrant of right female breast ([place])  03/16/2015 Mammogram   Right breast: mass   03/16/2015 Breast US   Right breast: hypoechoic irregular shadowing mass at 12 o'clock, 4 cm from the nipple measuring 1.4 x 1.3 x 1.2 cm. Right axillary lymph nodes with smooth cortical thickness   03/22/2015 Initial Biopsy   Right breast biopsy: Invasive ductal carcinoma with calcifications; ER+ (98%), PR+ (98%), Ki-67 6%, HER-2 negative (ratio 1.35)   04/04/2015 Breast MRI   Right breast: 2.0 cm hematoma/ seroma within the upper central portion of the breast following recent stereotactic guided core biopsy   04/05/2015 Clinical Stage   Stage IA: T1c N0   04/06/2015 Procedure   Genetics: OvaNext panel revealed  VUTS at [place] (c.2164G>A) otherwise negative at [facility], BARD1, BRCA1, BRCA2, BRIP1, CDH1, CHEK2, EPCAM, MLH1, MRE11A, MSH6, MUTYH, NBN, NF1, PALB2, PMS2, PTEN, RAD50, RAD51C, RAD51D, SMARCA4, STK11, and TP53.   UPDATE:  MSH2 c.2164G>A VUS was amended to Variant, Likely Benign on 04/28/2019 due to internal data.   04/28/2015 Definitive Surgery   Right lumpectomy/[facility] [name]): Invasive ductal carcinoma grade 1, 0.9 cm, low-grade DCIS, margins negative, 0/6 sentinel nodes negative, ER 98%, PR 98%, HER-2 negative (ratio 1.29), Ki-67 6%,  Oncotype DX score 10 (7% ROR)   04/28/2015 Pathologic Stage   Stage IA (T1b N0)    04/28/2015 Oncotype testing   Score 10 (7% ROR)   06/15/2015 - 07/14/2015 Radiation Therapy   Adjuvant RT [name]): Right breast  42.72 Gy over 21 fractions; right breast boost 10 G over 5 fractions   07/14/2015 -  Anti-estrogen oral therapy   Tamoxifen 20 mg daily. Planned duration of therapy 5-10 years.   09/01/2015 Survivorship   Survivorship care plan completed and given to patient.     CHIEF COMPLIANT: Follow-up of right breast cancer   INTERVAL HISTORY: [name] is a 51 y.o. with above-mentioned history of right breast cancer treated with lumpectomy, radiation, and who completed 5 years of tamoxifen. She presents to the clinic for a follow-up.    ALLERGIES:  is allergic to ciprofloxacin and sulfa antibiotics.  MEDICATIONS:  Current Outpatient Medications  Medication Sig Dispense Refill   ALPRAZolam (XANAX) 0.5 MG tablet TK 1 T PO Q 6 H PRA  1   diazepam (VALIUM) 5 MG tablet Take 1 tablet (5 mg total) by mouth as directed. Take one tablet one hour prior to MRI, then one tablet upon arrival if needed; must have driver 2 tablet 0   levothyroxine (SYNTHROID, LEVOTHROID) 50 MCG tablet Take 50 mcg by mouth daily before breakfast.     lisinopril (ZESTRIL) 10 MG tablet Take 10 mg by mouth daily.     pantoprazole (PROTONIX) 40 MG tablet Take 40 mg by mouth daily.     Tirzepatide [facility]) Inject into the skin once a week.     venlafaxine XR (EFFEXOR-XR) 75 MG 24 hr capsule TAKE 1 CAPSULE BY MOUTH EVERY DAY WITH BREAKFAST 30 capsule 0   No current facility-administered medications for this visit.    PHYSICAL EXAMINATION: ECOG PERFORMANCE STATUS: {CHL ONC ECOG [id number]}  There were  no vitals filed for this visit. There were no vitals filed for this visit.  BREAST:*** No palpable masses or nodules in either right or left breasts. No palpable axillary supraclavicular or infraclavicular adenopathy no breast tenderness or nipple discharge. (exam performed in the presence of a chaperone)  LABORATORY DATA:  I have reviewed the data as listed    Latest Ref Rng & Units 12/30/2019    1:56 PM 06/19/2016     3:00 PM 07/14/2015    8:18 AM  CMP  Glucose 70 - 99 mg/dL 143  157  131   BUN 6 - 20 mg/dL 12  11  12.9   Creatinine 0.44 - 1.00 mg/dL 0.83  0.84  0.8   Sodium 135 - 145 mmol/L 140  135  140   Potassium 3.5 - 5.1 mmol/L 4.5  3.3  3.1   Chloride 98 - 111 mmol/L 104  103    CO2 22 - 32 mmol/L _0 Calcium 8.9 - 10.3 mg/dL 9.4  10.0  9.8   Total Protein 6.5 - 8.1 g/dL 7.8   7.6   Total Bilirubin 0.3 - 1.2 mg/dL 0.4   0.76   Alkaline Phos 38 - 126 U/L 76   60   AST 15 - 41 U/L 19   14   ALT 0 - 44 U/L 31   20     Lab Results  Component Value Date   WBC 7.1 12/30/2019   HGB 15.1 (H) 12/30/2019   HCT 44.4 12/30/2019   MCV 93.1 12/30/2019   PLT 211 12/30/2019   NEUTROABS 4.7 12/30/2019    ASSESSMENT & PLAN:  No problem-specific Assessment & Plan notes found for this encounter.    No orders of the defined types were placed in this encounter.  The patient has a good understanding of the overall plan. she agrees with it. she will call with any problems that may develop before the next visit here. Total time spent: 30 mins including face to face time and time spent for planning, charting and co-ordination of care   [name], [place] 08/16/22    I Gardiner Coins am scribing for Dr. Lindi Adie  ***

## 2022-08-21 ENCOUNTER — Inpatient Hospital Stay: Payer: 59 | Admitting: Hematology and Oncology

## 2022-08-21 NOTE — Assessment & Plan Note (Deleted)
Right lumpectomy 04/28/2015: Invasive ductal carcinoma grade 1, 0.9 cm, low-grade DCIS, margins negative, 0/6 sentinel nodes negative, ER 98%, PR 98%, HER-2 negative, Ki-67 6%, T1 BN 0 stage IA, Oncotype DX score 10, 7% risk of recurrence, Adj radiation therapy 06/15/2015 to 07/14/15  Current Treatment: Tamoxifen 20 mg daily started 07/30/15 completed 2021 1 breast cancer index showed that she does not need to extend antiestrogen therapy  Her [place] and [place] has been closed because of COVID-19.   Surveillance: 1. Breast exam10/24/2023benign 2. mammogram9/29/2022: Benign breast density category C 3. Breast MRI 04/19/21 Benign Density Cat C  Breast cancer index: Low risk and hence tamoxifen was discontinued Return to clinic on an as-needed basis.

## 2022-08-25 ENCOUNTER — Other Ambulatory Visit: Payer: Self-pay | Admitting: Hematology and Oncology

## 2022-08-29 DIAGNOSIS — L919 Hypertrophic disorder of the skin, unspecified: Secondary | ICD-10-CM | POA: Diagnosis not present

## 2022-08-29 DIAGNOSIS — M67833 Other specified disorders of tendon, right wrist: Secondary | ICD-10-CM | POA: Diagnosis not present

## 2022-09-19 DIAGNOSIS — M79641 Pain in right hand: Secondary | ICD-10-CM | POA: Diagnosis not present

## 2022-09-27 ENCOUNTER — Other Ambulatory Visit: Payer: Self-pay | Admitting: Hematology and Oncology

## 2022-09-27 ENCOUNTER — Telehealth: Payer: Self-pay | Admitting: *Deleted

## 2022-09-27 DIAGNOSIS — M25531 Pain in right wrist: Secondary | ICD-10-CM | POA: Diagnosis not present

## 2022-09-27 NOTE — Telephone Encounter (Signed)
Received message from scheduling team stating pt wishes to not f/u with our office at this time.  States pt will be obtaining medications from PCP.

## 2022-10-01 ENCOUNTER — Inpatient Hospital Stay: Payer: 59

## 2022-10-01 ENCOUNTER — Inpatient Hospital Stay: Payer: 59 | Admitting: Hematology and Oncology

## 2022-10-02 ENCOUNTER — Other Ambulatory Visit: Payer: Self-pay | Admitting: Hematology and Oncology

## 2022-10-02 DIAGNOSIS — Z1231 Encounter for screening mammogram for malignant neoplasm of breast: Secondary | ICD-10-CM

## 2022-10-18 DIAGNOSIS — M25531 Pain in right wrist: Secondary | ICD-10-CM | POA: Diagnosis not present

## 2022-10-19 ENCOUNTER — Ambulatory Visit
Admission: RE | Admit: 2022-10-19 | Discharge: 2022-10-19 | Disposition: A | Discharge status: Home / Self Care | Payer: 59 | Source: Ambulatory Visit | Attending: Hematology and Oncology | Admitting: Hematology and Oncology

## 2022-10-19 DIAGNOSIS — Z1231 Encounter for screening mammogram for malignant neoplasm of breast: Secondary | ICD-10-CM | POA: Diagnosis not present

## 2022-10-25 ENCOUNTER — Other Ambulatory Visit: Payer: Self-pay | Admitting: Hematology and Oncology

## 2022-10-25 MED ORDER — VENLAFAXINE HCL ER 75 MG PO CP24: 75.0000 mg | ORAL_CAPSULE | Freq: Every day | ORAL | 0 refills | Status: DC

## 2022-11-06 DIAGNOSIS — M25531 Pain in right wrist: Secondary | ICD-10-CM | POA: Diagnosis not present

## 2022-11-11 ENCOUNTER — Other Ambulatory Visit: Payer: Self-pay | Admitting: Hematology and Oncology

## 2022-11-11 DIAGNOSIS — Z170 Estrogen receptor positive status [ER+]: Secondary | ICD-10-CM

## 2022-11-11 DIAGNOSIS — C50411 Malignant neoplasm of upper-outer quadrant of right female breast: Secondary | ICD-10-CM

## 2022-11-12 ENCOUNTER — Telehealth: Payer: Self-pay | Admitting: *Deleted

## 2022-11-12 DIAGNOSIS — M25531 Pain in right wrist: Secondary | ICD-10-CM | POA: Diagnosis not present

## 2022-11-12 NOTE — Telephone Encounter (Signed)
Received refill request via escribe for Effexor XR 75 mg. Patient called and LVM that she can transfer prescription to PCP in future, but that she will be out in 3 days and would appreciate if Dr. Lindi Adie could refill RX this time  Per Dr. Geralyn Flash verbal order, refill escribed. Patient notified that refill is for 30 day supply to allow enough time to transfer to PCP. Patient verbalized understanding of all information.

## 2022-11-26 DIAGNOSIS — I10 Essential (primary) hypertension: Secondary | ICD-10-CM | POA: Diagnosis not present

## 2022-11-26 DIAGNOSIS — E1169 Type 2 diabetes mellitus with other specified complication: Secondary | ICD-10-CM | POA: Diagnosis not present

## 2022-11-26 DIAGNOSIS — E039 Hypothyroidism, unspecified: Secondary | ICD-10-CM | POA: Diagnosis not present

## 2022-11-26 DIAGNOSIS — Z853 Personal history of malignant neoplasm of breast: Secondary | ICD-10-CM | POA: Diagnosis not present

## 2022-11-27 DIAGNOSIS — M79641 Pain in right hand: Secondary | ICD-10-CM | POA: Diagnosis not present

## 2022-12-07 DIAGNOSIS — M25531 Pain in right wrist: Secondary | ICD-10-CM | POA: Diagnosis not present

## 2022-12-21 DIAGNOSIS — M79641 Pain in right hand: Secondary | ICD-10-CM | POA: Diagnosis not present

## 2022-12-25 DIAGNOSIS — M25531 Pain in right wrist: Secondary | ICD-10-CM | POA: Diagnosis not present

## 2022-12-25 DIAGNOSIS — Z4789 Encounter for other orthopedic aftercare: Secondary | ICD-10-CM | POA: Diagnosis not present

## 2023-01-04 DIAGNOSIS — M25531 Pain in right wrist: Secondary | ICD-10-CM | POA: Diagnosis not present

## 2023-01-15 DIAGNOSIS — M25531 Pain in right wrist: Secondary | ICD-10-CM | POA: Diagnosis not present

## 2023-01-18 DIAGNOSIS — M25531 Pain in right wrist: Secondary | ICD-10-CM | POA: Diagnosis not present

## 2023-01-24 DIAGNOSIS — M25531 Pain in right wrist: Secondary | ICD-10-CM | POA: Diagnosis not present

## 2023-01-25 DIAGNOSIS — M25531 Pain in right wrist: Secondary | ICD-10-CM | POA: Diagnosis not present

## 2023-02-07 DIAGNOSIS — M79641 Pain in right hand: Secondary | ICD-10-CM | POA: Diagnosis not present

## 2023-02-11 DIAGNOSIS — E039 Hypothyroidism, unspecified: Secondary | ICD-10-CM | POA: Diagnosis not present

## 2023-02-11 DIAGNOSIS — E7800 Pure hypercholesterolemia, unspecified: Secondary | ICD-10-CM | POA: Diagnosis not present

## 2023-02-11 DIAGNOSIS — D509 Iron deficiency anemia, unspecified: Secondary | ICD-10-CM | POA: Diagnosis not present

## 2023-02-11 DIAGNOSIS — I10 Essential (primary) hypertension: Secondary | ICD-10-CM | POA: Diagnosis not present

## 2023-02-11 DIAGNOSIS — E1165 Type 2 diabetes mellitus with hyperglycemia: Secondary | ICD-10-CM | POA: Diagnosis not present

## 2023-02-12 DIAGNOSIS — M25531 Pain in right wrist: Secondary | ICD-10-CM | POA: Diagnosis not present

## 2023-02-28 DIAGNOSIS — M25531 Pain in right wrist: Secondary | ICD-10-CM | POA: Diagnosis not present

## 2023-02-28 DIAGNOSIS — Z09 Encounter for follow-up examination after completed treatment for conditions other than malignant neoplasm: Secondary | ICD-10-CM | POA: Diagnosis not present

## 2023-04-02 DIAGNOSIS — M25531 Pain in right wrist: Secondary | ICD-10-CM | POA: Diagnosis not present

## 2023-04-02 DIAGNOSIS — Z4789 Encounter for other orthopedic aftercare: Secondary | ICD-10-CM | POA: Diagnosis not present

## 2023-05-07 DIAGNOSIS — M25531 Pain in right wrist: Secondary | ICD-10-CM | POA: Diagnosis not present

## 2023-05-07 DIAGNOSIS — Z4789 Encounter for other orthopedic aftercare: Secondary | ICD-10-CM | POA: Diagnosis not present

## 2023-05-16 DIAGNOSIS — Z1272 Encounter for screening for malignant neoplasm of vagina: Secondary | ICD-10-CM | POA: Diagnosis not present

## 2023-05-16 DIAGNOSIS — R6882 Decreased libido: Secondary | ICD-10-CM | POA: Diagnosis not present

## 2023-05-16 DIAGNOSIS — Z01419 Encounter for gynecological examination (general) (routine) without abnormal findings: Secondary | ICD-10-CM | POA: Diagnosis not present

## 2023-05-16 DIAGNOSIS — Z6823 Body mass index (BMI) 23.0-23.9, adult: Secondary | ICD-10-CM | POA: Diagnosis not present

## 2023-06-27 DIAGNOSIS — M25531 Pain in right wrist: Secondary | ICD-10-CM | POA: Diagnosis not present

## 2023-07-10 DIAGNOSIS — E039 Hypothyroidism, unspecified: Secondary | ICD-10-CM | POA: Diagnosis not present

## 2023-07-10 DIAGNOSIS — I10 Essential (primary) hypertension: Secondary | ICD-10-CM | POA: Diagnosis not present

## 2023-07-10 DIAGNOSIS — Z13220 Encounter for screening for lipoid disorders: Secondary | ICD-10-CM | POA: Diagnosis not present

## 2023-07-10 DIAGNOSIS — F5101 Primary insomnia: Secondary | ICD-10-CM | POA: Diagnosis not present

## 2023-07-10 DIAGNOSIS — Z23 Encounter for immunization: Secondary | ICD-10-CM | POA: Diagnosis not present

## 2023-07-10 DIAGNOSIS — Z0000 Encounter for general adult medical examination without abnormal findings: Secondary | ICD-10-CM | POA: Diagnosis not present

## 2023-07-10 DIAGNOSIS — E282 Polycystic ovarian syndrome: Secondary | ICD-10-CM | POA: Diagnosis not present

## 2023-07-16 DIAGNOSIS — L910 Hypertrophic scar: Secondary | ICD-10-CM | POA: Diagnosis not present

## 2023-07-16 DIAGNOSIS — L820 Inflamed seborrheic keratosis: Secondary | ICD-10-CM | POA: Diagnosis not present

## 2023-07-16 DIAGNOSIS — D225 Melanocytic nevi of trunk: Secondary | ICD-10-CM | POA: Diagnosis not present

## 2023-07-16 DIAGNOSIS — C44311 Basal cell carcinoma of skin of nose: Secondary | ICD-10-CM | POA: Diagnosis not present

## 2023-07-16 DIAGNOSIS — D2262 Melanocytic nevi of left upper limb, including shoulder: Secondary | ICD-10-CM | POA: Diagnosis not present

## 2023-07-16 DIAGNOSIS — D2261 Melanocytic nevi of right upper limb, including shoulder: Secondary | ICD-10-CM | POA: Diagnosis not present

## 2023-07-16 DIAGNOSIS — D224 Melanocytic nevi of scalp and neck: Secondary | ICD-10-CM | POA: Diagnosis not present

## 2023-07-16 DIAGNOSIS — D485 Neoplasm of uncertain behavior of skin: Secondary | ICD-10-CM | POA: Diagnosis not present

## 2023-07-29 DIAGNOSIS — M25531 Pain in right wrist: Secondary | ICD-10-CM | POA: Diagnosis not present

## 2023-08-06 DIAGNOSIS — M25531 Pain in right wrist: Secondary | ICD-10-CM | POA: Diagnosis not present

## 2023-08-13 DIAGNOSIS — E7800 Pure hypercholesterolemia, unspecified: Secondary | ICD-10-CM | POA: Diagnosis not present

## 2023-08-13 DIAGNOSIS — E039 Hypothyroidism, unspecified: Secondary | ICD-10-CM | POA: Diagnosis not present

## 2023-08-13 DIAGNOSIS — E1165 Type 2 diabetes mellitus with hyperglycemia: Secondary | ICD-10-CM | POA: Diagnosis not present

## 2023-08-20 DIAGNOSIS — I10 Essential (primary) hypertension: Secondary | ICD-10-CM | POA: Diagnosis not present

## 2023-08-20 DIAGNOSIS — E7800 Pure hypercholesterolemia, unspecified: Secondary | ICD-10-CM | POA: Diagnosis not present

## 2023-08-20 DIAGNOSIS — E039 Hypothyroidism, unspecified: Secondary | ICD-10-CM | POA: Diagnosis not present

## 2023-08-20 DIAGNOSIS — E1165 Type 2 diabetes mellitus with hyperglycemia: Secondary | ICD-10-CM | POA: Diagnosis not present

## 2023-08-21 DIAGNOSIS — C44311 Basal cell carcinoma of skin of nose: Secondary | ICD-10-CM | POA: Diagnosis not present

## 2023-08-28 ENCOUNTER — Other Ambulatory Visit (HOSPITAL_BASED_OUTPATIENT_CLINIC_OR_DEPARTMENT_OTHER): Payer: Self-pay | Admitting: Endocrinology

## 2023-08-28 DIAGNOSIS — E7800 Pure hypercholesterolemia, unspecified: Secondary | ICD-10-CM

## 2023-09-04 ENCOUNTER — Ambulatory Visit (HOSPITAL_BASED_OUTPATIENT_CLINIC_OR_DEPARTMENT_OTHER)
Admission: RE | Admit: 2023-09-04 | Discharge: 2023-09-04 | Disposition: A | Discharge status: Home / Self Care | Payer: 59 | Source: Ambulatory Visit | Attending: Endocrinology | Admitting: Endocrinology

## 2023-09-04 DIAGNOSIS — E7800 Pure hypercholesterolemia, unspecified: Secondary | ICD-10-CM | POA: Insufficient documentation

## 2023-09-17 DIAGNOSIS — M25531 Pain in right wrist: Secondary | ICD-10-CM | POA: Diagnosis not present

## 2023-10-01 ENCOUNTER — Other Ambulatory Visit: Payer: Self-pay | Admitting: Family Medicine

## 2023-10-01 DIAGNOSIS — Z1231 Encounter for screening mammogram for malignant neoplasm of breast: Secondary | ICD-10-CM

## 2023-10-02 DIAGNOSIS — E119 Type 2 diabetes mellitus without complications: Secondary | ICD-10-CM | POA: Diagnosis not present

## 2023-10-10 DIAGNOSIS — M79672 Pain in left foot: Secondary | ICD-10-CM | POA: Diagnosis not present

## 2023-11-06 DIAGNOSIS — M25531 Pain in right wrist: Secondary | ICD-10-CM | POA: Diagnosis not present

## 2023-11-06 DIAGNOSIS — M24631 Ankylosis, right wrist: Secondary | ICD-10-CM | POA: Diagnosis not present

## 2023-11-21 ENCOUNTER — Ambulatory Visit
Admission: RE | Admit: 2023-11-21 | Discharge: 2023-11-21 | Disposition: A | Discharge status: Home / Self Care | Payer: BC Managed Care – PPO | Source: Ambulatory Visit | Attending: Family Medicine | Admitting: Family Medicine

## 2023-11-21 DIAGNOSIS — Z1231 Encounter for screening mammogram for malignant neoplasm of breast: Secondary | ICD-10-CM

## 2023-11-25 ENCOUNTER — Other Ambulatory Visit: Payer: Self-pay | Admitting: Family Medicine

## 2023-11-25 DIAGNOSIS — R928 Other abnormal and inconclusive findings on diagnostic imaging of breast: Secondary | ICD-10-CM

## 2023-11-25 DIAGNOSIS — M79641 Pain in right hand: Secondary | ICD-10-CM | POA: Diagnosis not present

## 2023-11-26 DIAGNOSIS — Z853 Personal history of malignant neoplasm of breast: Secondary | ICD-10-CM | POA: Diagnosis not present

## 2023-11-26 DIAGNOSIS — Z8042 Family history of malignant neoplasm of prostate: Secondary | ICD-10-CM | POA: Diagnosis not present

## 2023-11-26 DIAGNOSIS — Z801 Family history of malignant neoplasm of trachea, bronchus and lung: Secondary | ICD-10-CM | POA: Diagnosis not present

## 2023-11-26 DIAGNOSIS — Z800 Family history of malignant neoplasm of digestive organs: Secondary | ICD-10-CM | POA: Diagnosis not present

## 2023-12-03 ENCOUNTER — Ambulatory Visit
Admission: RE | Admit: 2023-12-03 | Discharge: 2023-12-03 | Disposition: A | Discharge status: Home / Self Care | Payer: BC Managed Care – PPO | Source: Ambulatory Visit | Attending: Family Medicine | Admitting: Family Medicine

## 2023-12-03 ENCOUNTER — Other Ambulatory Visit: Payer: Self-pay | Admitting: Family Medicine

## 2023-12-03 DIAGNOSIS — R928 Other abnormal and inconclusive findings on diagnostic imaging of breast: Secondary | ICD-10-CM

## 2023-12-03 DIAGNOSIS — N6320 Unspecified lump in the left breast, unspecified quadrant: Secondary | ICD-10-CM

## 2023-12-03 DIAGNOSIS — N6323 Unspecified lump in the left breast, lower outer quadrant: Secondary | ICD-10-CM | POA: Diagnosis not present

## 2023-12-09 ENCOUNTER — Ambulatory Visit
Admission: RE | Admit: 2023-12-09 | Discharge: 2023-12-09 | Disposition: A | Discharge status: Home / Self Care | Payer: BC Managed Care – PPO | Source: Ambulatory Visit | Attending: Family Medicine | Admitting: Family Medicine

## 2023-12-09 ENCOUNTER — Other Ambulatory Visit: Payer: Self-pay | Admitting: Family Medicine

## 2023-12-09 DIAGNOSIS — R928 Other abnormal and inconclusive findings on diagnostic imaging of breast: Secondary | ICD-10-CM

## 2023-12-09 DIAGNOSIS — N6320 Unspecified lump in the left breast, unspecified quadrant: Secondary | ICD-10-CM

## 2023-12-09 DIAGNOSIS — N6323 Unspecified lump in the left breast, lower outer quadrant: Secondary | ICD-10-CM | POA: Diagnosis not present

## 2023-12-09 HISTORY — PX: BREAST BIOPSY: SHX20

## 2023-12-10 LAB — SURGICAL PATHOLOGY

## 2023-12-12 ENCOUNTER — Ambulatory Visit
Admission: RE | Admit: 2023-12-12 | Discharge: 2023-12-12 | Disposition: A | Discharge status: Home / Self Care | Payer: BC Managed Care – PPO | Source: Ambulatory Visit | Attending: Family Medicine | Admitting: Family Medicine

## 2023-12-12 ENCOUNTER — Ambulatory Visit
Admission: RE | Admit: 2023-12-12 | Discharge: 2023-12-12 | Discharge status: Home / Self Care | Payer: BC Managed Care – PPO | Source: Ambulatory Visit | Attending: Family Medicine | Admitting: Family Medicine

## 2023-12-12 DIAGNOSIS — N6012 Diffuse cystic mastopathy of left breast: Secondary | ICD-10-CM | POA: Diagnosis not present

## 2023-12-12 DIAGNOSIS — N6311 Unspecified lump in the right breast, upper outer quadrant: Secondary | ICD-10-CM | POA: Diagnosis not present

## 2023-12-12 DIAGNOSIS — N6320 Unspecified lump in the left breast, unspecified quadrant: Secondary | ICD-10-CM

## 2023-12-12 DIAGNOSIS — R928 Other abnormal and inconclusive findings on diagnostic imaging of breast: Secondary | ICD-10-CM

## 2023-12-12 HISTORY — PX: BREAST BIOPSY: SHX20

## 2023-12-13 LAB — SURGICAL PATHOLOGY

## 2024-01-09 DIAGNOSIS — R7303 Prediabetes: Secondary | ICD-10-CM | POA: Diagnosis not present

## 2024-01-09 DIAGNOSIS — M79641 Pain in right hand: Secondary | ICD-10-CM | POA: Diagnosis not present

## 2024-01-09 DIAGNOSIS — I10 Essential (primary) hypertension: Secondary | ICD-10-CM | POA: Diagnosis not present

## 2024-01-09 DIAGNOSIS — K219 Gastro-esophageal reflux disease without esophagitis: Secondary | ICD-10-CM | POA: Diagnosis not present

## 2024-01-09 DIAGNOSIS — E039 Hypothyroidism, unspecified: Secondary | ICD-10-CM | POA: Diagnosis not present

## 2024-01-13 DIAGNOSIS — M79641 Pain in right hand: Secondary | ICD-10-CM | POA: Diagnosis not present

## 2024-01-14 DIAGNOSIS — Z853 Personal history of malignant neoplasm of breast: Secondary | ICD-10-CM | POA: Diagnosis not present

## 2024-02-07 DIAGNOSIS — M25531 Pain in right wrist: Secondary | ICD-10-CM | POA: Diagnosis not present

## 2024-02-07 DIAGNOSIS — Z4789 Encounter for other orthopedic aftercare: Secondary | ICD-10-CM | POA: Diagnosis not present

## 2024-02-21 DIAGNOSIS — Z4789 Encounter for other orthopedic aftercare: Secondary | ICD-10-CM | POA: Diagnosis not present

## 2024-02-21 DIAGNOSIS — M25531 Pain in right wrist: Secondary | ICD-10-CM | POA: Diagnosis not present

## 2024-03-05 ENCOUNTER — Other Ambulatory Visit: Payer: Self-pay | Admitting: Endocrinology

## 2024-03-05 DIAGNOSIS — E7800 Pure hypercholesterolemia, unspecified: Secondary | ICD-10-CM | POA: Diagnosis not present

## 2024-03-05 DIAGNOSIS — N951 Menopausal and female climacteric states: Secondary | ICD-10-CM

## 2024-03-05 DIAGNOSIS — E1165 Type 2 diabetes mellitus with hyperglycemia: Secondary | ICD-10-CM | POA: Diagnosis not present

## 2024-03-05 DIAGNOSIS — I10 Essential (primary) hypertension: Secondary | ICD-10-CM | POA: Diagnosis not present

## 2024-03-05 DIAGNOSIS — E039 Hypothyroidism, unspecified: Secondary | ICD-10-CM | POA: Diagnosis not present

## 2024-03-20 DIAGNOSIS — M25531 Pain in right wrist: Secondary | ICD-10-CM | POA: Diagnosis not present

## 2024-04-14 DIAGNOSIS — M25562 Pain in left knee: Secondary | ICD-10-CM | POA: Diagnosis not present

## 2024-04-16 ENCOUNTER — Other Ambulatory Visit (HOSPITAL_COMMUNITY): Payer: Self-pay | Admitting: Medical

## 2024-04-16 ENCOUNTER — Ambulatory Visit (HOSPITAL_COMMUNITY)
Admission: RE | Admit: 2024-04-16 | Discharge: 2024-04-16 | Disposition: A | Discharge status: Home / Self Care | Source: Ambulatory Visit | Attending: Vascular Surgery | Admitting: Vascular Surgery

## 2024-04-16 DIAGNOSIS — M79605 Pain in left leg: Secondary | ICD-10-CM

## 2024-04-26 DIAGNOSIS — M25562 Pain in left knee: Secondary | ICD-10-CM | POA: Diagnosis not present

## 2024-04-30 DIAGNOSIS — M25531 Pain in right wrist: Secondary | ICD-10-CM | POA: Diagnosis not present

## 2024-05-04 ENCOUNTER — Other Ambulatory Visit: Payer: Self-pay | Admitting: Family Medicine

## 2024-05-04 DIAGNOSIS — R110 Nausea: Secondary | ICD-10-CM | POA: Diagnosis not present

## 2024-05-04 DIAGNOSIS — R109 Unspecified abdominal pain: Secondary | ICD-10-CM | POA: Diagnosis not present

## 2024-05-04 DIAGNOSIS — R197 Diarrhea, unspecified: Secondary | ICD-10-CM | POA: Diagnosis not present

## 2024-05-05 ENCOUNTER — Ambulatory Visit
Admission: RE | Admit: 2024-05-05 | Discharge: 2024-05-05 | Disposition: A | Discharge status: Home / Self Care | Source: Ambulatory Visit | Attending: Family Medicine | Admitting: Family Medicine

## 2024-05-05 DIAGNOSIS — R110 Nausea: Secondary | ICD-10-CM | POA: Diagnosis not present

## 2024-05-05 DIAGNOSIS — M25562 Pain in left knee: Secondary | ICD-10-CM | POA: Diagnosis not present

## 2024-05-05 DIAGNOSIS — R109 Unspecified abdominal pain: Secondary | ICD-10-CM | POA: Diagnosis not present

## 2024-05-07 DIAGNOSIS — R109 Unspecified abdominal pain: Secondary | ICD-10-CM | POA: Diagnosis not present

## 2024-06-17 DIAGNOSIS — Z01419 Encounter for gynecological examination (general) (routine) without abnormal findings: Secondary | ICD-10-CM | POA: Diagnosis not present

## 2024-06-17 DIAGNOSIS — Z6822 Body mass index (BMI) 22.0-22.9, adult: Secondary | ICD-10-CM | POA: Diagnosis not present

## 2024-07-03 DIAGNOSIS — N958 Other specified menopausal and perimenopausal disorders: Secondary | ICD-10-CM | POA: Diagnosis not present

## 2024-07-03 DIAGNOSIS — Z4789 Encounter for other orthopedic aftercare: Secondary | ICD-10-CM | POA: Diagnosis not present

## 2024-07-03 DIAGNOSIS — Z13820 Encounter for screening for osteoporosis: Secondary | ICD-10-CM | POA: Diagnosis not present

## 2024-07-03 DIAGNOSIS — E039 Hypothyroidism, unspecified: Secondary | ICD-10-CM | POA: Diagnosis not present

## 2024-07-15 DIAGNOSIS — D1801 Hemangioma of skin and subcutaneous tissue: Secondary | ICD-10-CM | POA: Diagnosis not present

## 2024-07-15 DIAGNOSIS — D235 Other benign neoplasm of skin of trunk: Secondary | ICD-10-CM | POA: Diagnosis not present

## 2024-07-15 DIAGNOSIS — L814 Other melanin hyperpigmentation: Secondary | ICD-10-CM | POA: Diagnosis not present

## 2024-07-15 DIAGNOSIS — R52 Pain, unspecified: Secondary | ICD-10-CM | POA: Diagnosis not present

## 2024-07-15 DIAGNOSIS — Z85828 Personal history of other malignant neoplasm of skin: Secondary | ICD-10-CM | POA: Diagnosis not present

## 2024-07-21 DIAGNOSIS — E7800 Pure hypercholesterolemia, unspecified: Secondary | ICD-10-CM | POA: Diagnosis not present

## 2024-07-21 DIAGNOSIS — Z0000 Encounter for general adult medical examination without abnormal findings: Secondary | ICD-10-CM | POA: Diagnosis not present

## 2024-07-21 DIAGNOSIS — R945 Abnormal results of liver function studies: Secondary | ICD-10-CM | POA: Diagnosis not present

## 2024-07-21 DIAGNOSIS — E039 Hypothyroidism, unspecified: Secondary | ICD-10-CM | POA: Diagnosis not present

## 2024-07-21 DIAGNOSIS — E1169 Type 2 diabetes mellitus with other specified complication: Secondary | ICD-10-CM | POA: Diagnosis not present

## 2024-07-21 DIAGNOSIS — I10 Essential (primary) hypertension: Secondary | ICD-10-CM | POA: Diagnosis not present

## 2024-09-15 DIAGNOSIS — M25531 Pain in right wrist: Secondary | ICD-10-CM | POA: Diagnosis not present

## 2024-09-15 DIAGNOSIS — M25532 Pain in left wrist: Secondary | ICD-10-CM | POA: Diagnosis not present

## 2024-09-17 DIAGNOSIS — Z85828 Personal history of other malignant neoplasm of skin: Secondary | ICD-10-CM | POA: Diagnosis not present

## 2024-09-17 DIAGNOSIS — L905 Scar conditions and fibrosis of skin: Secondary | ICD-10-CM | POA: Diagnosis not present

## 2024-09-22 DIAGNOSIS — M25562 Pain in left knee: Secondary | ICD-10-CM | POA: Diagnosis not present

## 2024-10-26 ENCOUNTER — Other Ambulatory Visit: Payer: Self-pay | Admitting: Obstetrics and Gynecology

## 2024-10-26 DIAGNOSIS — Z1231 Encounter for screening mammogram for malignant neoplasm of breast: Secondary | ICD-10-CM

## 2024-11-17 ENCOUNTER — Other Ambulatory Visit: Payer: Self-pay | Admitting: Medical Genetics

## 2024-11-23 ENCOUNTER — Ambulatory Visit

## 2024-11-25 ENCOUNTER — Other Ambulatory Visit

## 2024-11-25 ENCOUNTER — Ambulatory Visit
Admission: RE | Admit: 2024-11-25 | Discharge: 2024-11-25 | Disposition: A | Source: Ambulatory Visit | Attending: Obstetrics and Gynecology | Admitting: Obstetrics and Gynecology

## 2024-11-25 DIAGNOSIS — Z1231 Encounter for screening mammogram for malignant neoplasm of breast: Secondary | ICD-10-CM

## 2024-12-24 ENCOUNTER — Other Ambulatory Visit: Payer: Self-pay
